# Patient Record
Sex: Male | Born: 1960 | Hispanic: No | Marital: Single | Smoking: Never smoker
Health system: Southern US, Community
[De-identification: ages and names within clinical notes are randomized; demographics above are authoritative.]

## PROBLEM LIST (undated history)

## (undated) DIAGNOSIS — J45909 Unspecified asthma, uncomplicated: Secondary | ICD-10-CM

## (undated) DIAGNOSIS — R7303 Prediabetes: Secondary | ICD-10-CM

## (undated) HISTORY — PX: TONSILLECTOMY: SUR1361

## (undated) HISTORY — DX: Prediabetes: R73.03

---

## 2001-06-04 ENCOUNTER — Emergency Department (HOSPITAL_COMMUNITY): Admission: EM | Admit: 2001-06-04 | Discharge: 2001-06-04 | Payer: Self-pay | Admitting: Emergency Medicine

## 2001-06-04 ENCOUNTER — Encounter: Payer: Self-pay | Admitting: Emergency Medicine

## 2001-09-22 ENCOUNTER — Inpatient Hospital Stay (HOSPITAL_COMMUNITY): Admission: EM | Admit: 2001-09-22 | Discharge: 2001-09-26 | Payer: Self-pay | Admitting: *Deleted

## 2001-09-30 ENCOUNTER — Other Ambulatory Visit (HOSPITAL_COMMUNITY): Admission: RE | Admit: 2001-09-30 | Discharge: 2001-11-08 | Payer: Self-pay | Admitting: Psychiatry

## 2011-10-02 DIAGNOSIS — J45901 Unspecified asthma with (acute) exacerbation: Secondary | ICD-10-CM | POA: Insufficient documentation

## 2011-11-17 DIAGNOSIS — J4550 Severe persistent asthma, uncomplicated: Secondary | ICD-10-CM | POA: Insufficient documentation

## 2011-11-17 DIAGNOSIS — J4551 Severe persistent asthma with (acute) exacerbation: Secondary | ICD-10-CM | POA: Insufficient documentation

## 2014-10-22 ENCOUNTER — Emergency Department (HOSPITAL_COMMUNITY)
Admission: EM | Admit: 2014-10-22 | Discharge: 2014-10-22 | Disposition: A | Discharge status: Home / Self Care | Payer: BLUE CROSS/BLUE SHIELD | Attending: Emergency Medicine | Admitting: Emergency Medicine

## 2014-10-22 ENCOUNTER — Emergency Department (HOSPITAL_COMMUNITY): Payer: BLUE CROSS/BLUE SHIELD

## 2014-10-22 ENCOUNTER — Encounter (HOSPITAL_COMMUNITY): Payer: Self-pay | Admitting: Emergency Medicine

## 2014-10-22 DIAGNOSIS — R0602 Shortness of breath: Secondary | ICD-10-CM

## 2014-10-22 DIAGNOSIS — J45909 Unspecified asthma, uncomplicated: Secondary | ICD-10-CM | POA: Diagnosis present

## 2014-10-22 DIAGNOSIS — J45901 Unspecified asthma with (acute) exacerbation: Secondary | ICD-10-CM | POA: Diagnosis not present

## 2014-10-22 HISTORY — DX: Unspecified asthma, uncomplicated: J45.909

## 2014-10-22 LAB — CBC WITH DIFFERENTIAL/PLATELET
Basophils Absolute: 0 10*3/uL (ref 0.0–0.1)
Basophils Relative: 0 % (ref 0–1)
Eosinophils Absolute: 0.5 10*3/uL (ref 0.0–0.7)
Eosinophils Relative: 5 % (ref 0–5)
HCT: 49.4 % (ref 39.0–52.0)
Hemoglobin: 17 g/dL (ref 13.0–17.0)
Lymphocytes Relative: 24 % (ref 12–46)
Lymphs Abs: 2.7 10*3/uL (ref 0.7–4.0)
MCH: 28.4 pg (ref 26.0–34.0)
MCHC: 34.4 g/dL (ref 30.0–36.0)
MCV: 82.5 fL (ref 78.0–100.0)
Monocytes Absolute: 0.6 10*3/uL (ref 0.1–1.0)
Monocytes Relative: 5 % (ref 3–12)
Neutro Abs: 7.6 10*3/uL (ref 1.7–7.7)
Neutrophils Relative %: 66 % (ref 43–77)
Platelets: 311 10*3/uL (ref 150–400)
RBC: 5.99 MIL/uL — ABNORMAL HIGH (ref 4.22–5.81)
RDW: 14 % (ref 11.5–15.5)
WBC: 11.4 10*3/uL — ABNORMAL HIGH (ref 4.0–10.5)

## 2014-10-22 LAB — BASIC METABOLIC PANEL
Anion gap: 8 (ref 5–15)
BUN: 12 mg/dL (ref 6–20)
CO2: 23 mmol/L (ref 22–32)
Calcium: 8.8 mg/dL — ABNORMAL LOW (ref 8.9–10.3)
Chloride: 108 mmol/L (ref 101–111)
Creatinine, Ser: 1.27 mg/dL — ABNORMAL HIGH (ref 0.61–1.24)
GFR calc Af Amer: >60 mL/min (ref >60)
GFR calc non Af Amer: >60 mL/min (ref >60)
Glucose, Bld: 118 mg/dL — ABNORMAL HIGH (ref 65–99)
Potassium: 4.5 mmol/L (ref 3.5–5.1)
Sodium: 139 mmol/L (ref 135–145)

## 2014-10-22 MED ORDER — IPRATROPIUM BROMIDE 0.02 % IN SOLN
0.5000 mg | Freq: Once | RESPIRATORY_TRACT | Status: AC
  Administered 2014-10-22: 0.5 mg via RESPIRATORY_TRACT

## 2014-10-22 MED ORDER — ALBUTEROL SULFATE (2.5 MG/3ML) 0.083% IN NEBU
INHALATION_SOLUTION | RESPIRATORY_TRACT | Status: AC
  Administered 2014-10-22: 5 mg via RESPIRATORY_TRACT
  Filled 2014-10-22: qty 6

## 2014-10-22 MED ORDER — ALBUTEROL (5 MG/ML) CONTINUOUS INHALATION SOLN
10.0000 mg/h | INHALATION_SOLUTION | Freq: Once | RESPIRATORY_TRACT | Status: AC
  Administered 2014-10-22: 10 mg/h via RESPIRATORY_TRACT
  Filled 2014-10-22: qty 20

## 2014-10-22 MED ORDER — ALBUTEROL SULFATE HFA 108 (90 BASE) MCG/ACT IN AERS: 1.0000 | INHALATION_SPRAY | RESPIRATORY_TRACT | Status: DC | PRN

## 2014-10-22 MED ORDER — PREDNISONE 20 MG PO TABS: ORAL_TABLET | ORAL | Status: DC

## 2014-10-22 MED ORDER — IPRATROPIUM BROMIDE 0.02 % IN SOLN
RESPIRATORY_TRACT | Status: AC
  Administered 2014-10-22: 0.5 mg via RESPIRATORY_TRACT
  Filled 2014-10-22: qty 2.5

## 2014-10-22 MED ORDER — ALBUTEROL SULFATE (2.5 MG/3ML) 0.083% IN NEBU
2.5000 mg | INHALATION_SOLUTION | Freq: Once | RESPIRATORY_TRACT | Status: AC
Start: 2014-10-22 — End: 2014-10-22
  Administered 2014-10-22: 5 mg via RESPIRATORY_TRACT

## 2014-10-22 MED ORDER — SODIUM CHLORIDE 0.9 % IV BOLUS (SEPSIS)
500.0000 mL | Freq: Once | INTRAVENOUS | Status: AC
  Administered 2014-10-22: 500 mL via INTRAVENOUS

## 2014-10-22 MED ORDER — METHYLPREDNISOLONE SODIUM SUCC 125 MG IJ SOLR
125.0000 mg | Freq: Once | INTRAMUSCULAR | Status: AC
  Administered 2014-10-22: 125 mg via INTRAVENOUS
  Filled 2014-10-22: qty 2

## 2014-10-22 NOTE — Discharge Instructions (Signed)

## 2014-10-22 NOTE — ED Notes (Signed)
Portable chest at bedside

## 2014-10-22 NOTE — Progress Notes (Signed)
Pt taken off CAT and on RA at this time. Pt states he feels much better. Clear BS bilaterally.

## 2014-10-22 NOTE — ED Notes (Signed)
Patient coming from home with acute asthma attack with lower right.  History of asthma.

## 2014-10-22 NOTE — ED Provider Notes (Signed)
CSN: [id number]     Arrival date & time 10/22/14  [id number] History   First MD Initiated Contact with Patient 10/22/14 [phone]     Chief Complaint  Patient presents with  . Asthma     (Consider location/radiation/quality/duration/timing/severity/associated sxs/prior Treatment) Patient is a 54 y.o. male presenting with asthma. The history is provided by the patient.  Asthma This is a chronic problem. The current episode started 1 to 2 hours ago. The problem occurs constantly. The problem has been rapidly worsening. Associated symptoms include shortness of breath. Pertinent negatives include no chest pain, no abdominal pain and no headaches. Nothing aggravates the symptoms. Nothing relieves the symptoms. He has tried nothing for the symptoms. The treatment provided no relief.    Past Medical History  Diagnosis Date  . Asthma    Past Surgical History  Procedure Laterality Date  . Tonsillectomy     History reviewed. No pertinent family history. History  Substance Use Topics  . Smoking status: Never Smoker   . Smokeless tobacco: Not on file  . Alcohol Use: No    Review of Systems  Constitutional: Negative for fever.  HENT: Negative for drooling and rhinorrhea.   Eyes: Negative for pain.  Respiratory: Positive for shortness of breath and wheezing.   Cardiovascular: Negative for chest pain and leg swelling.  Gastrointestinal: Negative for nausea, vomiting, abdominal pain and diarrhea.  Genitourinary: Negative for dysuria and hematuria.  Musculoskeletal: Negative for gait problem and neck pain.  Skin: Negative for color change.  Neurological: Negative for numbness and headaches.  Hematological: Negative for adenopathy.  Psychiatric/Behavioral: Negative for behavioral problems.  All other systems reviewed and are negative.     Allergies  Review of patient's allergies indicates no known allergies.  Home Medications   Prior to Admission medications   Not on File   There were no  vitals taken for this visit. Physical Exam  Constitutional: He appears well-developed and well-nourished. He appears distressed.  HENT:  Head: Normocephalic and atraumatic.  Right Ear: External ear normal.  Left Ear: External ear normal.  Nose: Nose normal.  Mouth/Throat: Oropharynx is clear and moist. No oropharyngeal exudate.  Eyes: Conjunctivae and EOM are normal. Pupils are equal, round, and reactive to light.  Neck: Normal range of motion. Neck supple.  Cardiovascular: Normal rate, regular rhythm, normal heart sounds and intact distal pulses.  Exam reveals no gallop and no friction rub.   No murmur heard. Pulmonary/Chest: He is in respiratory distress. He has wheezes.  Tachypneic on exam. Diminished breath sounds bilaterally with wheezing. Patient speaking in only a few short words.  Abdominal: Soft. Bowel sounds are normal. He exhibits no distension. There is no tenderness. There is no rebound and no guarding.  Musculoskeletal: Normal range of motion. He exhibits no edema or tenderness.  Neurological: He is alert.  Skin: Skin is warm and dry.  Psychiatric: He has a normal mood and affect. His behavior is normal.  Nursing note and vitals reviewed.   ED Course  Procedures (including critical care time) Labs Review Labs Reviewed  CBC WITH DIFFERENTIAL/PLATELET - Abnormal; Notable for the following:    WBC 11.4 (*)    RBC 5.99 (*)    All other components within normal limits  BASIC METABOLIC PANEL - Abnormal; Notable for the following:    Glucose, Bld 118 (*)    Creatinine, Ser 1.27 (*)    Calcium 8.8 (*)    All other components within normal limits    Imaging Review  Dg Chest Port 1 View  10/22/2014   CLINICAL DATA:  Asthma with shortness of breath  EXAM: PORTABLE CHEST - 1 VIEW  COMPARISON:  None.  FINDINGS: The lungs are clear. The heart size and pulmonary vascularity are normal. No adenopathy. No bone lesions.  IMPRESSION: No edema or consolidation.   Electronically  Signed   By: Bretta Bang III M.D.   On: 10/22/2014 08:55     EKG Interpretation   Date/Time:  Thursday October 22 2014 09:03:50 EDT Ventricular Rate:  90 PR Interval:  157 QRS Duration: 84 QT Interval:  381 QTC Calculation: 466 R Axis:   80 Text Interpretation:  Sinus rhythm Probable left atrial enlargement  Baseline wander in lead(s) V1 V3 Confirmed by Mariabelen Pressly  MD, Lorann Tani ([id number])  on 10/22/2014 9:35:57 AM      MDM   Final diagnoses:  SOB (shortness of breath)  Asthma exacerbation    8:41 AM 54 y.o. male with a history of asthma who presents with shortness of breath that began this morning. Patient initially tachypneic speaking only and short words. Diminished airflow bilaterally with wheezing. Patient states that his symptoms feel like previous asthma exacerbations. He denies any chest pain. Exam limited by his shortness of breath. Will treat with Solu-Medrol, continuous albuterol breathing treatment.  10:31 AM: I interpreted/reviewed the labs and/or imaging which were non-contributory.  Pt feeling much better on exam. I offered further observation and treatment versus discharge. The patient is feeling well and would like to go home. Lung sounds are clear. I have discussed the diagnosis/risks/treatment options with the patient and believe the pt to be eligible for discharge home to follow-up with his pcp as needed. We also discussed returning to the ED immediately if new or worsening sx occur. We discussed the sx which are most concerning (e.g., worsening sob, fever, cp) that necessitate immediate return. Medications administered to the patient during their visit and any new prescriptions provided to the patient are listed below.  Medications given during this visit Medications  albuterol (PROVENTIL) (2.5 MG/3ML) 0.083% nebulizer solution 2.5 mg (5 mg Nebulization Given 10/22/14 0841)  ipratropium (ATROVENT) nebulizer solution 0.5 mg (0.5 mg Nebulization Given 10/22/14 0841)   methylPREDNISolone sodium succinate (SOLU-MEDROL) 125 mg/2 mL injection 125 mg (125 mg Intravenous Given 10/22/14 0848)  albuterol (PROVENTIL,VENTOLIN) solution continuous neb (10 mg/hr Nebulization Given 10/22/14 0853)  sodium chloride 0.9 % bolus 500 mL (0 mLs Intravenous Stopped 10/22/14 0935)    New Prescriptions   ALBUTEROL (PROVENTIL HFA;VENTOLIN HFA) 108 (90 BASE) MCG/ACT INHALER    Inhale 1-2 puffs into the lungs every 4 (four) hours as needed for wheezing or shortness of breath.   PREDNISONE (DELTASONE) 20 MG TABLET    Take 3 tablets on day 1. Take 2 tablets on day 2 and 3. Take 1 tablet on day 4.     Purvis Sheffield, MD 10/22/14 1031

## 2015-05-22 ENCOUNTER — Emergency Department (HOSPITAL_COMMUNITY)
Admission: EM | Admit: 2015-05-22 | Discharge: 2015-05-22 | Disposition: A | Discharge status: Home / Self Care | Payer: BLUE CROSS/BLUE SHIELD | Attending: Emergency Medicine | Admitting: Emergency Medicine

## 2015-05-22 ENCOUNTER — Encounter (HOSPITAL_COMMUNITY): Payer: Self-pay | Admitting: *Deleted

## 2015-05-22 DIAGNOSIS — J45901 Unspecified asthma with (acute) exacerbation: Secondary | ICD-10-CM | POA: Diagnosis not present

## 2015-05-22 DIAGNOSIS — Z79899 Other long term (current) drug therapy: Secondary | ICD-10-CM | POA: Diagnosis not present

## 2015-05-22 DIAGNOSIS — J45909 Unspecified asthma, uncomplicated: Secondary | ICD-10-CM | POA: Diagnosis present

## 2015-05-22 MED ORDER — ALBUTEROL SULFATE (2.5 MG/3ML) 0.083% IN NEBU
5.0000 mg | INHALATION_SOLUTION | Freq: Once | RESPIRATORY_TRACT | Status: AC
  Administered 2015-05-22: 5 mg via RESPIRATORY_TRACT

## 2015-05-22 MED ORDER — PREDNISONE 20 MG PO TABS
60.0000 mg | ORAL_TABLET | Freq: Once | ORAL | Status: AC
  Administered 2015-05-22: 60 mg via ORAL
  Filled 2015-05-22: qty 3

## 2015-05-22 MED ORDER — PREDNISONE 10 MG PO TABS: ORAL_TABLET | ORAL | Status: DC

## 2015-05-22 MED ORDER — ALBUTEROL SULFATE (2.5 MG/3ML) 0.083% IN NEBU
INHALATION_SOLUTION | RESPIRATORY_TRACT | Status: AC
  Filled 2015-05-22: qty 6

## 2015-05-22 MED ORDER — ALBUTEROL SULFATE HFA 108 (90 BASE) MCG/ACT IN AERS: 2.0000 | INHALATION_SPRAY | RESPIRATORY_TRACT | Status: DC | PRN

## 2015-05-22 NOTE — ED Notes (Signed)
Pt reports onset this am of asthma attack and no relief with inhalers. spo2 96% at triage.

## 2015-05-22 NOTE — ED Provider Notes (Signed)
CSN: [id number]     Arrival date & time 05/22/15  1519 History   First MD Initiated Contact with Patient 05/22/15 1621     Chief Complaint  Patient presents with  . Asthma     (Consider location/radiation/quality/duration/timing/severity/associated sxs/prior Treatment) HPI Jason Cardenas is a 55 y.o. male with hx of asthma, presents to ED with complaint of shortness of breath. Patient states that he was up this morning, stating that his mother's house, when he suddenly developed wheezing, chest tightness, shortness of breath. Patient states it felt like his asthma attack. He used inhaler 3 times, which did not help. He states he tried to wait it out to see if he feels better, but he was not improving so decided to come to emergency department. Patient denies any chest pain. He denies any cough. He received a breathing treatment upon arrival to emergency department and states he feels much better and back to baseline. He states he has had multiple asthma attacks in the past and this is what it felt like. At this time he is asymptomatic and has no complaints.  Past Medical History  Diagnosis Date  . Asthma    Past Surgical History  Procedure Laterality Date  . Tonsillectomy     History reviewed. No pertinent family history. Social History  Substance Use Topics  . Smoking status: Never Smoker   . Smokeless tobacco: None  . Alcohol Use: No    Review of Systems  Constitutional: Negative for fever and chills.  Respiratory: Positive for shortness of breath and wheezing. Negative for cough and chest tightness.   Cardiovascular: Negative for chest pain, palpitations and leg swelling.  Gastrointestinal: Negative for nausea, vomiting, abdominal pain, diarrhea and abdominal distention.  Genitourinary: Negative for urgency.  Musculoskeletal: Negative for myalgias, arthralgias, neck pain and neck stiffness.  Skin: Negative for rash.  Allergic/Immunologic: Negative for immunocompromised  state.  Neurological: Negative for dizziness, weakness, light-headedness, numbness and headaches.  All other systems reviewed and are negative.     Allergies  Review of patient's allergies indicates no known allergies.  Home Medications   Prior to Admission medications   Medication Sig Start Date End Date Taking? Authorizing Provider  albuterol (PROVENTIL HFA;VENTOLIN HFA) 108 (90 BASE) MCG/ACT inhaler Inhale 1-2 puffs into the lungs every 4 (four) hours as needed for wheezing or shortness of breath. 10/22/14   Purvis Sheffield, MD  predniSONE (DELTASONE) 20 MG tablet Take 3 tablets on day 1. Take 2 tablets on day 2 and 3. Take 1 tablet on day 4. 10/23/14   Purvis Sheffield, MD   BP 143/100 mmHg  Pulse 107  Temp(Src) 97.5 F (36.4 C) (Oral)  Resp 22  SpO2 96% Physical Exam  Constitutional: He is oriented to person, place, and time. He appears well-developed and well-nourished. No distress.  HENT:  Head: Normocephalic and atraumatic.  Eyes: Conjunctivae are normal.  Neck: Neck supple.  Cardiovascular: Normal rate, regular rhythm and normal heart sounds.   Pulmonary/Chest: Effort normal. No respiratory distress. He has no wheezes. He has no rales. He exhibits no tenderness.  Abdominal: Soft. Bowel sounds are normal. He exhibits no distension. There is no tenderness. There is no rebound.  Musculoskeletal: He exhibits no edema.  Neurological: He is alert and oriented to person, place, and time.  Skin: Skin is warm and dry.  Nursing note and vitals reviewed.   ED Course  Procedures (including critical care time) Labs Review Labs Reviewed - No data to display  Imaging  Review No results found. I have personally reviewed and evaluated these images and lab results as part of my medical decision-making.   EKG Interpretation None      MDM   Final diagnoses:  Asthma, unspecified asthma severity, uncomplicated    Patient with sudden onset of wheezing and shortness of breath  earlier today. Now resolved after breathing treatment. Patient states that his inhaler is all out. On my evaluation patient is no longer wheezing. He denies any chest pain or cough. He is in no distress. He states he feels back to normal. Patient has oxygen saturation 100% on room air. He is tachycardic, but I suspect this could be from albuterol. I do not suspect patient has a PE, given his symptoms completely resolve, he denied any chest pain, he is hypoxic, he has no recent travel or surgeries, no lower extremity swelling or pain. We'll discharge him home on a short prednisone course, inhaler, follow-up as needed. Return precautions discussed   Filed Vitals:   05/22/15 1530 05/22/15 1647  BP: 143/100 118/90  Pulse: 107 109  Temp: 97.5 F (36.4 C) 98 F (36.7 C)  TempSrc: Oral   Resp: 22 18  SpO2: 96% 100%       Jaynie Crumble, PA-C 05/22/15 1936  Laurence Spates, MD 05/22/15 2307

## 2015-05-22 NOTE — Discharge Instructions (Signed)
Use your inhaler 2 puffs every 4 hours as needed. Prednisone as prescribed until all gone, next dose tomorrow. Follow-up with your primary care doctor as needed.   Asthma, Acute Bronchospasm Acute bronchospasm caused by asthma is also referred to as an asthma attack. Bronchospasm means your air passages become narrowed. The narrowing is caused by inflammation and tightening of the muscles in the air tubes (bronchi) in your lungs. This can make it hard to breathe or cause you to wheeze and cough. CAUSES Possible triggers are:  Animal dander from the skin, hair, or feathers of animals.  Dust mites contained in house dust.  Cockroaches.  Pollen from trees or grass.  Mold.  Cigarette or tobacco smoke.  Air pollutants such as dust, household cleaners, hair sprays, aerosol sprays, paint fumes, strong chemicals, or strong odors.  Cold air or weather changes. Cold air may trigger inflammation. Winds increase molds and pollens in the air.  Strong emotions such as crying or laughing hard.  Stress.  Certain medicines such as aspirin or beta-blockers.  Sulfites in foods and drinks, such as dried fruits and wine.  Infections or inflammatory conditions, such as a flu, cold, or inflammation of the nasal membranes (rhinitis).  Gastroesophageal reflux disease (GERD). GERD is a condition where stomach acid backs up into your esophagus.  Exercise or strenuous activity. SIGNS AND SYMPTOMS   Wheezing.  Excessive coughing, particularly at night.  Chest tightness.  Shortness of breath. DIAGNOSIS  Your health care provider will ask you about your medical history and perform a physical exam. A chest X-ray or blood testing may be performed to look for other causes of your symptoms or other conditions that may have triggered your asthma attack. TREATMENT  Treatment is aimed at reducing inflammation and opening up the airways in your lungs. Most asthma attacks are treated with inhaled  medicines. These include quick relief or rescue medicines (such as bronchodilators) and controller medicines (such as inhaled corticosteroids). These medicines are sometimes given through an inhaler or a nebulizer. Systemic steroid medicine taken by mouth or given through an IV tube also can be used to reduce the inflammation when an attack is moderate or severe. Antibiotic medicines are only used if a bacterial infection is present.  HOME CARE INSTRUCTIONS   Rest.  Drink plenty of liquids. This helps the mucus to remain thin and be easily coughed up. Only use caffeine in moderation and do not use alcohol until you have recovered from your illness.  Do not smoke. Avoid being exposed to secondhand smoke.  You play a critical role in keeping yourself in good health. Avoid exposure to things that cause you to wheeze or to have breathing problems.  Keep your medicines up-to-date and available. Carefully follow your health care provider's treatment plan.  Take your medicine exactly as prescribed.  When pollen or pollution is bad, keep windows closed and use an air conditioner or go to places with air conditioning.  Asthma requires careful medical care. See your health care provider for a follow-up as advised. If you are more than [redacted] weeks pregnant and you were prescribed any new medicines, let your obstetrician know about the visit and how you are doing. Follow up with your health care provider as directed.  After you have recovered from your asthma attack, make an appointment with your outpatient doctor to talk about ways to reduce the likelihood of future attacks. If you do not have a doctor who manages your asthma, make an appointment with  a primary care doctor to discuss your asthma. SEEK IMMEDIATE MEDICAL CARE IF:   You are getting worse.  You have trouble breathing. If severe, call your local emergency services (911 in the U.S.).  You develop chest pain or discomfort.  You are  vomiting.  You are not able to keep fluids down.  You are coughing up yellow, green, brown, or bloody sputum.  You have a fever and your symptoms suddenly get worse.  You have trouble swallowing. MAKE SURE YOU:   Understand these instructions.  Will watch your condition.  Will get help right away if you are not doing well or get worse.   This information is not intended to replace advice given to you by your health care provider. Make sure you discuss any questions you have with your health care provider.   Document Released: 07/26/2006 Document Revised: 04/15/2013 Document Reviewed: 10/16/2012 Elsevier Interactive Patient Education 2016 [employer].    Emergency Department Resource Guide 1) Find a Doctor and Pay Out of Pocket Although you won't have to find out who is covered by your insurance plan, it is a good idea to ask around and get recommendations. You will then need to call the office and see if the doctor you have chosen will accept you as a new patient and what types of options they offer for patients who are self-pay. Some doctors offer discounts or will set up payment plans for their patients who do not have insurance, but you will need to ask so you aren't surprised when you get to your appointment.  2) Contact Your Local Health Department Not all health departments have doctors that can see patients for sick visits, but many do, so it is worth a call to see if yours does. If you don't know where your local health department is, you can check in your phone book. The CDC also has a tool to help you locate your state's health department, and many state websites also have listings of all of their local health departments.  3) Find a Walk-in Clinic If your illness is not likely to be very severe or complicated, you may want to try a walk in clinic. These are popping up all over the country in pharmacies, drugstores, and shopping centers. They're usually staffed by nurse  practitioners or physician assistants that have been trained to treat common illnesses and complaints. They're usually fairly quick and inexpensive. However, if you have serious medical issues or chronic medical problems, these are probably not your best option.  No Primary Care Doctor: - Call Health Connect at  [phone] - they can help you locate a primary care doctor that  accepts your insurance, provides certain services, etc. - Physician Referral Service- [phone]  Chronic Pain Problems: Organization         Address  Phone   Notes  [facility]  [phone] Patients need to be referred by their primary care doctor.   Medication Assistance: Organization         Address  Phone   Notes  [facility] Medication [facility] [address] [place] [place], [place] [address] [phone] --Must be a resident of [facility] -- Must have NO insurance coverage whatsoever (no Medicaid/ Medicare, etc.) -- The pt. MUST have a primary care doctor that directs their care regularly and follows them in the community   [facility]ist  [phone]   [employer]  [phone]    Agencies that  provide inexpensive medical care: Organization         Address  Phone   Notes  [facility]  [phone]   [facility]    [phone]   [facility] [address] [place], [place] [address] [phone]   [facility] [address] [place], [place] [phone]   Planned Parenthood    [phone]   [facility]    [phone]   Community [facility] and [facility]  [address], [place] Phone:  [phone], Fax:  [phone] Hours of Operation:  9 am - 6 pm, M-F.  Also accepts Medicaid/Medicare and self-pay.  [facility]  [address], [place] Phone: [phone], Fax: [phone]. Hours of Operation:  8:30 am -  5:30 pm, M-F.  Also accepts Medicaid and self-pay.  [facility] [place] [address], [place] Phone: [phone]   [facility] [address] Natasha Bence [place], [place] [phone], Ext. [phone] Mondays & Thursdays: 7-9 AM.  First 15 patients are seen on a first come, first serve basis.    Medicaid-accepting [facility] Providers:  Organization         Address  Phone   Notes  [facility] [address], [place] [phone] Also accepts self-pay patients.  [facility] [address] Laurell Josephs [place], [place]  [phone]   [facility] [address], [place] [phone]   [facility] [address], [place] [phone]   Renaye Rakers [address], [place]   [phone] Only accepts [place] Access [place] patients after they have their name applied to their card.   Self-Pay (no insurance) in [facility]:  Organization         Address  Phone   Notes  Sickle Cell Patients, [facility] [address] [place], [place] [phone]   [facility] [address] [place], [place] [phone]   [facility] [address]  [address], [place] [phone]   [facility]/Dr. Osei-Bonsu  [address], [place] or [address], [place] [phone] Phone number for both [place] and [place] locations is the same.  [facility]gent [facility] and [facility] [address], [place] [phone]   [facility] [address] [place] or [address] [phone]   [facility] [address] [place] [phone], phone; [phone], fax Sees patients 1st and 3rd Saturday of every month.  Must not qualify for public or private insurance (i.e. Medicaid, Medicare, [place] Health Choice,  Veterans' Benefits)  Household income should be no more than 200% of the poverty level The clinic cannot treat you if you are pregnant or think you are pregnant  Sexually transmitted diseases are not treated at the clinic.    Dental Care: Organization         Address  Phone  Notes  [facility] Department of [facility] [address] [place], [place] [phone] Accepts children up to age 75 who are enrolled in [place] or [place] Health Choice; pregnant women with a Medicaid card; and children who have applied for Medicaid or [place] Health  Choice, but were declined, whose parents can pay a reduced fee at time of service.  [facility]  [address] [place] [address], [place] [phone] Accepts children up to age 7 who are enrolled in [place] or [place] Health Choice; pregnant women with a Medicaid card; and children who have applied for Medicaid or [place] Health Choice, but were declined, whose parents can pay a reduced fee at time of service.  [facility]  [address] [place], [place] [phone] Patients are seen by appointment only. Walk-ins are not accepted. [facility] will see patients 43 years of age and older. Monday - Tuesday (8am-5pm) Most Wednesdays (8:30-5pm) $30 per visit, cash only  [facility] Access [facility]  [address], [facility] [phone] Patients are seen by appointment only. Walk-ins are not accepted. [facility] will see patients 48 years of age and older. One Wednesday Evening (Monthly: Volunteer Based).  $30 per visit, cash only  [employer]  [phone] for adults; Children under age 32, call Graduate Pediatric Dentistry at [phone]. Children aged 9-14, please call [phone] to request a pediatric application.  Dental services are provided in all areas of dental care including fillings, crowns and bridges, complete and  partial dentures, implants, gum treatment, root canals, and extractions. Preventive care is also provided. Treatment is provided to both adults and children. Patients are selected via a lottery and there is often a waiting list.   [facility] [address] [place]  [phone] [email or web address]   [facility] [address] [place], [place] [phone], Ext. [phone] Second and Fourth Thursday of each month, opens at 6:30 AM; Clinic ends at 9 AM.  Patients are seen on a first-come first-served basis, and a limited number are seen during each clinic.   [facility]  [address] Ether Griffins [place], [place] [phone]   Eligibility Requirements You must have lived in [place], [place], or [place] counties for at least the last three months.   You cannot be eligible for state or federal sponsored [place], including [employer], [place], or [employer].   You generally cannot be eligible for healthcare insurance through your employer.    How to apply: Eligibility screenings are held every Tuesday and Wednesday afternoon from 1:00 pm until 4:00 pm. You do not need an appointment for the interview!  [facility] [address], [place], [place] [phone]   [facility] Health Department  [phone]   [facility]  [phone]   [facility]  [phone]    Behavioral Health Resources in the Community: Intensive Outpatient Programs Organization         Address  Phone  Notes  [facility] [address] [place], [place] [phone]   [facility] [address], [place], [place] [phone]   [facility] [address], [place], [place]  [phone]   [facility] [address]  [place], [place] [phone] or [phone]   Substance Abuse Resources Organization          Address  Phone  Notes  [facility] Drug [facility]  [phone]   [facility]  [phone]   [facility]  [phone]   Floydene Flock  [phone]   Residential & Outpatient Substance Abuse Program  [phone]  Psychological Services Organization         Address  Phone  Notes  [facility] [place]  [place]  [phone]   [place] [address], [place] or [phone]    Mobile Crisis Teams Organization         Address  Phone  Notes  [facility], Mobile Crisis Care Unit  [phone]   [facility]  [address] [place], [place]   [place] Levels [address] [place] [phone]    Self-Help/Support Groups Organization         Address  Phone             Notes  [place] of [place] - variety of support groups  [place] Call for more information  Narcotics Anonymous (NA), [facility] [address], [employer]   2 meetings at this location   Special educational needs teacher         Address  Phone  Notes  [facility] [place],    [place]  [phone]   [facility]  [address], [place] [phone], [place], [place]   [place], [place] [phone] Admissions: 8am-3pm M-F  Incentives Substance [place] [address]    [place], [place] [phone]   [facility] [address] [place], [place], [place]   [facility] [address]  [place], [place]   [facility] - Intensive Outpatient [address] Kristeen Mans [address], [place], [place]   [facility] ([place].,  [place], [place] [phone] or [phone]   [facility] ([facility]) [address], [place], [place] Accepts Medicaid  [facility] [address],  [place] [phone] Substance Abuse/Addiction Treatment   [facility] Organization         Address  Phone  Notes  [facility]  [phone]   Domenic Schwab, PhD [address] Arlis Porta [place], [place]   [phone] or [phone]   Rufus Conyers [place], [place] [phone]   [facility] [address] [place], [place] [phone] Insurance/Medicaid/sponsorship through [facility] and Families [address] [place]                                    [place], [place] [phone] [place] [address], [place] [phone]    Dr. Adele Schilder  [phone]   [facility] Dept. 1) [address], [place] 2) [place] 3)  [place] [address], [place] [phone]  [phone]   [place] [phone] or [phone] (After Hours)

## 2015-11-10 ENCOUNTER — Emergency Department (HOSPITAL_COMMUNITY)
Admission: EM | Admit: 2015-11-10 | Discharge: 2015-11-10 | Disposition: A | Discharge status: Home / Self Care | Payer: Self-pay | Attending: Emergency Medicine | Admitting: Emergency Medicine

## 2015-11-10 DIAGNOSIS — J45901 Unspecified asthma with (acute) exacerbation: Secondary | ICD-10-CM

## 2015-11-10 MED ORDER — ALBUTEROL SULFATE HFA 108 (90 BASE) MCG/ACT IN AERS
2.0000 | INHALATION_SPRAY | Freq: Once | RESPIRATORY_TRACT | Status: AC
  Administered 2015-11-10: 2 via RESPIRATORY_TRACT
  Filled 2015-11-10: qty 6.7

## 2015-11-10 MED ORDER — DEXAMETHASONE SODIUM PHOSPHATE 10 MG/ML IJ SOLN
10.0000 mg | Freq: Once | INTRAMUSCULAR | Status: AC
  Administered 2015-11-10: 10 mg via INTRAMUSCULAR
  Filled 2015-11-10: qty 1

## 2015-11-10 MED ORDER — ALBUTEROL SULFATE (2.5 MG/3ML) 0.083% IN NEBU
INHALATION_SOLUTION | RESPIRATORY_TRACT | Status: AC
  Filled 2015-11-10: qty 6

## 2015-11-10 NOTE — ED Notes (Signed)
Patient states started having problems this morning about 6 am.   Patient has used inhaler x 3 today and then ran out.   Patient states the inhaler helps.   Patient using accessory muscles to breath at triage.   Patient is diaphoretic also.   Shortness of breath continues to worsen.

## 2015-11-10 NOTE — Discharge Instructions (Signed)
Please use your inhaler as prescribed. Follow-up with your doctor or the community health and wellness Center to establish primary care. Return to ED for any new or worsening symptoms as we discussed.  Asthma, Acute Bronchospasm Acute bronchospasm caused by asthma is also referred to as an asthma attack. Bronchospasm means your air passages become narrowed. The narrowing is caused by inflammation and tightening of the muscles in the air tubes (bronchi) in your lungs. This can make it hard to breathe or cause you to wheeze and cough. CAUSES Possible triggers are:  Animal dander from the skin, hair, or feathers of animals.  Dust mites contained in house dust.  Cockroaches.  Pollen from trees or grass.  Mold.  Cigarette or tobacco smoke.  Air pollutants such as dust, household cleaners, hair sprays, aerosol sprays, paint fumes, strong chemicals, or strong odors.  Cold air or weather changes. Cold air may trigger inflammation. Winds increase molds and pollens in the air.  Strong emotions such as crying or laughing hard.  Stress.  Certain medicines such as aspirin or beta-blockers.  Sulfites in foods and drinks, such as dried fruits and wine.  Infections or inflammatory conditions, such as a flu, cold, or inflammation of the nasal membranes (rhinitis).  Gastroesophageal reflux disease (GERD). GERD is a condition where stomach acid backs up into your esophagus.  Exercise or strenuous activity. SIGNS AND SYMPTOMS   Wheezing.  Excessive coughing, particularly at night.  Chest tightness.  Shortness of breath. DIAGNOSIS  Your health care provider will ask you about your medical history and perform a physical exam. A chest X-ray or blood testing may be performed to look for other causes of your symptoms or other conditions that may have triggered your asthma attack. TREATMENT  Treatment is aimed at reducing inflammation and opening up the airways in your lungs. Most asthma  attacks are treated with inhaled medicines. These include quick relief or rescue medicines (such as bronchodilators) and controller medicines (such as inhaled corticosteroids). These medicines are sometimes given through an inhaler or a nebulizer. Systemic steroid medicine taken by mouth or given through an IV tube also can be used to reduce the inflammation when an attack is moderate or severe. Antibiotic medicines are only used if a bacterial infection is present.  HOME CARE INSTRUCTIONS   Rest.  Drink plenty of liquids. This helps the mucus to remain thin and be easily coughed up. Only use caffeine in moderation and do not use alcohol until you have recovered from your illness.  Do not smoke. Avoid being exposed to secondhand smoke.  You play a critical role in keeping yourself in good health. Avoid exposure to things that cause you to wheeze or to have breathing problems.  Keep your medicines up-to-date and available. Carefully follow your health care provider's treatment plan.  Take your medicine exactly as prescribed.  When pollen or pollution is bad, keep windows closed and use an air conditioner or go to places with air conditioning.  Asthma requires careful medical care. See your health care provider for a follow-up as advised. If you are more than [redacted] weeks pregnant and you were prescribed any new medicines, let your obstetrician know about the visit and how you are doing. Follow up with your health care provider as directed.  After you have recovered from your asthma attack, make an appointment with your outpatient doctor to talk about ways to reduce the likelihood of future attacks. If you do not have a doctor who manages your  asthma, make an appointment with a primary care doctor to discuss your asthma. SEEK IMMEDIATE MEDICAL CARE IF:   You are getting worse.  You have trouble breathing. If severe, call your local emergency services (911 in the [place].S.).  You develop chest pain or  discomfort.  You are vomiting.  You are not able to keep fluids down.  You are coughing up yellow, green, brown, or bloody sputum.  You have a fever and your symptoms suddenly get worse.  You have trouble swallowing. MAKE SURE YOU:   Understand these instructions.  Will watch your condition.  Will get help right away if you are not doing well or get worse.   This information is not intended to replace advice given to you by your health care provider. Make sure you discuss any questions you have with your health care provider.   Document Released: 07/26/2006 Document Revised: 04/15/2013 Document Reviewed: 10/16/2012 Elsevier Interactive Patient Education [employer] Elsevier Inc.

## 2015-11-10 NOTE — ED Notes (Signed)
Patient doing much better during nebulizer.

## 2015-11-10 NOTE — ED Provider Notes (Signed)
CSN: [id number]     Arrival date & time 11/10/15  1326 History   First MD Initiated Contact with Patient 11/10/15 1530     Chief Complaint  Patient presents with  . Asthma     (Consider location/radiation/quality/duration/timing/severity/associated sxs/prior Treatment) HPI Jason Cardenas is a 55 y.o. male with history of asthma here for evaluation of asthma exacerbation. Patient reports presently 3 hours ago, he began to experience an exacerbation of his asthma. He used his albuterol inhaler, which only had 2 puffs left. He reports initially this is helpful, but seemed to worsen as he ran out of his inhaler. Denies any fevers, chills, productive cough, leg swelling, chest pain. Patient received a breathing treatment in triage and states feels much better. No other modifying factors.  Past Medical History  Diagnosis Date  . Asthma    Past Surgical History  Procedure Laterality Date  . Tonsillectomy     No family history on file. Social History  Substance Use Topics  . Smoking status: Never Smoker   . Smokeless tobacco: Not on file  . Alcohol Use: No    Review of Systems A 10 point review of systems was completed and was negative except for pertinent positives and negatives as mentioned in the history of present illness    Allergies  Review of patient's allergies indicates no known allergies.  Home Medications   Prior to Admission medications   Medication Sig Start Date End Date Taking? Authorizing Provider  albuterol (PROVENTIL HFA;VENTOLIN HFA) 108 (90 Base) MCG/ACT inhaler Inhale 2 puffs into the lungs every 4 (four) hours as needed for wheezing or shortness of breath. 05/22/15   Tatyana Kirichenko, PA-C  predniSONE (DELTASONE) 10 MG tablet Take 5 tab day 1, take 4 tab day 2, take 3 tab day 3, take 2 tab day 4, and take 1 tab day 5 05/22/15   Tatyana Kirichenko, PA-C   BP 127/89 mmHg  Pulse 78  Temp(Src) 97.8 F (36.6 C) (Oral)  Resp 12  SpO2 99% Physical Exam   Constitutional: He is oriented to person, place, and time. He appears well-developed and well-nourished.  HENT:  Head: Normocephalic and atraumatic.  Mouth/Throat: Oropharynx is clear and moist.  Eyes: Conjunctivae are normal. Pupils are equal, round, and reactive to light. Right eye exhibits no discharge. Left eye exhibits no discharge. No scleral icterus.  Neck: Neck supple.  Cardiovascular: Normal rate, regular rhythm and normal heart sounds.   Pulmonary/Chest: Effort normal and breath sounds normal. No respiratory distress. He has no wheezes. He has no rales.  Abdominal: Soft. There is no tenderness.  Musculoskeletal: He exhibits no tenderness.  Neurological: He is alert and oriented to person, place, and time.  Cranial Nerves II-XII grossly intact  Skin: Skin is warm and dry. No rash noted.  Psychiatric: He has a normal mood and affect.  Nursing note and vitals reviewed.   ED Course  Procedures (including critical care time) Labs Review Labs Reviewed - No data to display  Imaging Review No results found. I have personally reviewed and evaluated these images and lab results as part of my medical decision-making.   EKG Interpretation None      MDM  Patient with history of asthma here for evaluation of acute asthma exacerbation. Ran out of albuterol inhaler at home which incited his exacerbation. Breathing treatment in triage resolved symptoms. He appears very well, hemodynamically stable, afebrile. Oxygen saturation 99% on room air. Benign cardiopulmonary exam. Given IM Decadron, albuterol inhaler at  home. I do not feel that he requires home steroids. Doubt PE, ACS or other emergent cardiopulmonary pathology. Given referral to community health and wellness to establish primary care. Return precautions discussed. Appropriate for discharge  Final diagnoses:  Asthma exacerbation        Jason PeekBenjamin Zaydyn Havey, PA-C 11/10/15 1632  Maia PlanJoshua G Long, MD 11/10/15 1924

## 2015-12-05 ENCOUNTER — Emergency Department (HOSPITAL_COMMUNITY): Payer: Self-pay

## 2015-12-05 ENCOUNTER — Emergency Department (HOSPITAL_COMMUNITY)
Admission: EM | Admit: 2015-12-05 | Discharge: 2015-12-05 | Disposition: A | Discharge status: Home / Self Care | Payer: Self-pay | Attending: Emergency Medicine | Admitting: Emergency Medicine

## 2015-12-05 ENCOUNTER — Encounter (HOSPITAL_COMMUNITY): Payer: Self-pay

## 2015-12-05 DIAGNOSIS — Z79899 Other long term (current) drug therapy: Secondary | ICD-10-CM | POA: Insufficient documentation

## 2015-12-05 DIAGNOSIS — J45901 Unspecified asthma with (acute) exacerbation: Secondary | ICD-10-CM

## 2015-12-05 MED ORDER — ALBUTEROL SULFATE HFA 108 (90 BASE) MCG/ACT IN AERS
4.0000 | INHALATION_SPRAY | Freq: Once | RESPIRATORY_TRACT | Status: AC
  Administered 2015-12-05: 4 via RESPIRATORY_TRACT
  Filled 2015-12-05: qty 6.7

## 2015-12-05 MED ORDER — ALBUTEROL SULFATE (2.5 MG/3ML) 0.083% IN NEBU
5.0000 mg | INHALATION_SOLUTION | Freq: Once | RESPIRATORY_TRACT | Status: AC
  Administered 2015-12-05: 5 mg via RESPIRATORY_TRACT
  Filled 2015-12-05: qty 6

## 2015-12-05 MED ORDER — PREDNISONE 20 MG PO TABS: 60.0000 mg | ORAL_TABLET | Freq: Every day | ORAL | 0 refills | Status: AC

## 2015-12-05 MED ORDER — ALBUTEROL SULFATE HFA 108 (90 BASE) MCG/ACT IN AERS: 1.0000 | INHALATION_SPRAY | Freq: Four times a day (QID) | RESPIRATORY_TRACT | 0 refills | Status: DC | PRN

## 2015-12-05 MED ORDER — PREDNISONE 20 MG PO TABS
60.0000 mg | ORAL_TABLET | Freq: Once | ORAL | Status: AC
  Administered 2015-12-05: 60 mg via ORAL
  Filled 2015-12-05: qty 3

## 2015-12-05 NOTE — ED Provider Notes (Signed)
[facility]-EMERGENCY DEPT Provider Note   CSN: [id number] Arrival date & time: 12/05/15  2013  First Provider Contact:  None       History   Chief Complaint Chief Complaint  Patient presents with  . Shortness of Breath    HPI Jason Cardenas is a 55 y.o. male.  The history is provided by the patient and medical records.  Shortness of Breath  This is a recurrent problem. The average episode lasts 1 day. The problem occurs frequently.The current episode started yesterday. The problem has been gradually worsening. Associated symptoms include wheezing. Pertinent negatives include no fever, no headaches, no rhinorrhea, no neck pain, no cough, no sputum production, no chest pain, no vomiting, no abdominal pain, no rash and no leg swelling. The problem's precipitants include weather/humidity and smoke. He has tried beta-agonist inhalers for the symptoms. The treatment provided mild relief. He has had no prior hospitalizations. He has had prior ED visits. He has had no prior ICU admissions. Associated medical issues include asthma.    Past Medical History:  Diagnosis Date  . Asthma     There are no active problems to display for this patient.   Past Surgical History:  Procedure Laterality Date  . TONSILLECTOMY         Home Medications    Prior to Admission medications   Medication Sig Start Date End Date Taking? Authorizing Provider  albuterol (PROVENTIL HFA;VENTOLIN HFA) 108 (90 Base) MCG/ACT inhaler Inhale 2 puffs into the lungs every 4 (four) hours as needed for wheezing or shortness of breath. 05/22/15  Yes Tatyana Kirichenko, PA-C  Multiple Vitamins-Minerals (MULTIVITAMIN WITH MINERALS) tablet Take 1 tablet by mouth daily.   Yes Historical Provider, MD  albuterol (PROVENTIL HFA;VENTOLIN HFA) 108 (90 Base) MCG/ACT inhaler Inhale 1-2 puffs into the lungs every 6 (six) hours as needed for wheezing or shortness of breath. 12/05/15   Shaune Pollackameron Gusta Marksberry, MD  predniSONE (DELTASONE) 20 MG  tablet Take 3 tablets (60 mg total) by mouth daily. 12/05/15 12/10/15  Shaune Pollackameron Ashlei Chinchilla, MD    Family History History reviewed. No pertinent family history.  Social History Social History  Substance Use Topics  . Smoking status: Never Smoker  . Smokeless tobacco: Not on file  . Alcohol use No     Allergies   Review of patient's allergies indicates no known allergies.   Review of Systems Review of Systems  Constitutional: Negative for chills, fatigue and fever.  HENT: Negative for congestion and rhinorrhea.   Eyes: Negative for visual disturbance.  Respiratory: Positive for shortness of breath and wheezing. Negative for cough and sputum production.   Cardiovascular: Negative for chest pain and leg swelling.  Gastrointestinal: Negative for abdominal pain, diarrhea, nausea and vomiting.  Genitourinary: Negative for dysuria and flank pain.  Musculoskeletal: Negative for neck pain and neck stiffness.  Skin: Negative for rash and wound.  Allergic/Immunologic: Negative for immunocompromised state.  Neurological: Negative for syncope, weakness and headaches.     Physical Exam Updated Vital Signs BP 135/88   Pulse 97   Resp 16   SpO2 94%   Physical Exam  Constitutional: He is oriented to person, place, and time. He appears well-developed and well-nourished. No distress.  HENT:  Head: Normocephalic and atraumatic.  Mouth/Throat: Oropharynx is clear and moist.  Eyes: Conjunctivae are normal. Pupils are equal, round, and reactive to light.  Neck: Neck supple.  Cardiovascular: Normal rate, regular rhythm and normal heart sounds.  Exam reveals no friction rub.   No  murmur heard. Pulmonary/Chest: Effort normal. No respiratory distress. He has no decreased breath sounds. He has wheezes (mild, diffuse, end-expiratory only). He has no rhonchi. He has no rales.  Abdominal: He exhibits no distension. There is no tenderness.  Musculoskeletal: He exhibits no edema.  Neurological: He is  alert and oriented to person, place, and time. He exhibits normal muscle tone.  Skin: Skin is warm. Capillary refill takes less than 2 seconds.  Nursing note and vitals reviewed.    ED Treatments / Results  Labs (all labs ordered are listed, but only abnormal results are displayed) Labs Reviewed - No data to display  EKG  EKG Interpretation None       Radiology Dg Chest 2 View  Result Date: 12/05/2015 CLINICAL DATA:  Shortness of breath for 2 hours. EXAM: CHEST  2 VIEW COMPARISON:  10/22/2014 FINDINGS: Airway thickening is present, suggesting bronchitis or reactive airways disease. Cardiac and mediastinal margins appear normal. No pneumothorax or pneumomediastinum identified. No airspace opacity or pleural effusion. Mild thoracic spondylosis. IMPRESSION: 1. Airway thickening is present, suggesting bronchitis or reactive airways disease. Electronically Signed   By: Gaylyn Rong M.D.   On: 12/05/2015 20:54    Procedures Procedures (including critical care time)  Medications Ordered in ED Medications  albuterol (PROVENTIL) (2.5 MG/3ML) 0.083% nebulizer solution 5 mg (5 mg Nebulization Given 12/05/15 2049)  albuterol (PROVENTIL) (2.5 MG/3ML) 0.083% nebulizer solution 5 mg (5 mg Nebulization Given 12/05/15 2212)  predniSONE (DELTASONE) tablet 60 mg (60 mg Oral Given 12/05/15 2212)  albuterol (PROVENTIL HFA;VENTOLIN HFA) 108 (90 Base) MCG/ACT inhaler 4 puff (4 puffs Inhalation Given 12/05/15 2212)     Initial Impression / Assessment and Plan / ED Course  I have reviewed the triage vital signs and the nursing notes.  Pertinent labs & imaging results that were available during my care of the patient were reviewed by me and considered in my medical decision making (see chart for details).  Clinical Course   55 yo M with PMHx of mild intermittent asthma who p/w moderate SOB, wheezing after running out of his home albuterol inhaler. On arrival, pt with diffuse wheezing but normal  WOB, good aeration, speaking in full sentences. Suspect acute on chronic mild asthma exacerbation. He has an extensive h/o same and his sx feel similar to this, and correlate directly with running out of his home albuterol. Pt has had no fever, chills, and CXR shows no PNA - doubt PNA or infectious etiology. No LE edema, tachycardia, or signs of PE. No CP to suggest ACS or coronary etiology.  Sx completely resolved with breathing tx. He is ambulatory throughout ED without SOB or hypoxia. Feels much better. Will give prednisone here, albuterol, and advised PCP f/u.  Final Clinical Impressions(s) / ED Diagnoses   Final diagnoses:  Asthma exacerbation    New Prescriptions Discharge Medication List as of 12/05/2015 11:09 PM    START taking these medications   Details  !! albuterol (PROVENTIL HFA;VENTOLIN HFA) 108 (90 Base) MCG/ACT inhaler Inhale 1-2 puffs into the lungs every 6 (six) hours as needed for wheezing or shortness of breath., Starting Sun 12/05/2015, Print    predniSONE (DELTASONE) 20 MG tablet Take 3 tablets (60 mg total) by mouth daily., Starting Sun 12/05/2015, Until Fri 12/10/2015, Print     !! - Potential duplicate medications found. Please discuss with provider.       Shaune Pollack, MD 12/06/15 1031

## 2015-12-05 NOTE — ED Notes (Signed)
Pt ambulated with pulse oximetry up and down the hall way from Rm 12 to Rm 20, O2 SAT remained >92%, pt denied difficulty, shortness of breath and seemed to tolerate the activity. Reported improvement.

## 2015-12-05 NOTE — ED Notes (Signed)
Unable to collect EKG or accurate blood pressure patient is in to much discomfort and cannot hold still.

## 2015-12-05 NOTE — ED Triage Notes (Signed)
Pt present to ED POV c/o of asthma exacerbation. Pt is diaphoretic, unable to speak in complete sentences, and purse-lipped breathing. Pt states that he was at work when this started. Unable to use his inhaler at work. A&Ox4.

## 2016-01-05 ENCOUNTER — Encounter (HOSPITAL_COMMUNITY): Payer: Self-pay

## 2016-01-05 ENCOUNTER — Emergency Department (HOSPITAL_COMMUNITY)
Admission: EM | Admit: 2016-01-05 | Discharge: 2016-01-05 | Disposition: A | Discharge status: Home / Self Care | Payer: Self-pay | Attending: Emergency Medicine | Admitting: Emergency Medicine

## 2016-01-05 DIAGNOSIS — J45901 Unspecified asthma with (acute) exacerbation: Secondary | ICD-10-CM

## 2016-01-05 DIAGNOSIS — Z79899 Other long term (current) drug therapy: Secondary | ICD-10-CM | POA: Insufficient documentation

## 2016-01-05 MED ORDER — ALBUTEROL SULFATE (2.5 MG/3ML) 0.083% IN NEBU
5.0000 mg | INHALATION_SOLUTION | Freq: Once | RESPIRATORY_TRACT | Status: AC
  Administered 2016-01-05: 5 mg via RESPIRATORY_TRACT

## 2016-01-05 MED ORDER — PREDNISONE 20 MG PO TABS: 40.0000 mg | ORAL_TABLET | Freq: Every day | ORAL | 0 refills | Status: DC

## 2016-01-05 MED ORDER — ALBUTEROL SULFATE HFA 108 (90 BASE) MCG/ACT IN AERS
2.0000 | INHALATION_SPRAY | RESPIRATORY_TRACT | Status: DC | PRN
  Filled 2016-01-05: qty 6.7

## 2016-01-05 MED ORDER — ALBUTEROL SULFATE (2.5 MG/3ML) 0.083% IN NEBU
INHALATION_SOLUTION | RESPIRATORY_TRACT | Status: AC
  Filled 2016-01-05: qty 6

## 2016-01-05 MED ORDER — IPRATROPIUM-ALBUTEROL 0.5-2.5 (3) MG/3ML IN SOLN
3.0000 mL | Freq: Once | RESPIRATORY_TRACT | Status: AC
  Administered 2016-01-05: 3 mL via RESPIRATORY_TRACT
  Filled 2016-01-05: qty 3

## 2016-01-05 MED ORDER — PREDNISONE 20 MG PO TABS
60.0000 mg | ORAL_TABLET | Freq: Once | ORAL | Status: AC
  Administered 2016-01-05: 60 mg via ORAL
  Filled 2016-01-05: qty 3

## 2016-01-05 NOTE — ED Notes (Signed)
Pt given inhaler at time of discharge and demonstrated appropriate use.

## 2016-01-05 NOTE — ED Triage Notes (Signed)
Pt complaining of SOB and asthma. Pt states hx of same. Pt unsure trigger for this attack, states believes d/t weather.

## 2016-01-05 NOTE — ED Triage Notes (Signed)
Pt sats 85% on RA. Pt given 5mg  albuterol via face mask at triage.

## 2016-01-05 NOTE — ED Provider Notes (Signed)
[facility]-EMERGENCY DEPT Provider Note   CSN: [id number] Arrival date & time: 01/05/16  0138  By signing my name below, I, Emmanuella Mensah, attest that this documentation has been prepared under the direction and in the presence of Dione Boozeavid Quasim Doyon, MD. Electronically Signed: Angelene GiovanniEmmanuella Mensah, ED Scribe. 01/05/16. 2:21 AM.   History   Chief Complaint Chief Complaint  Patient presents with  . Shortness of Breath  . Asthma    HPI Comments: Jason Cardenas is a 55 y.o. male with a hx of asthma who presents to the Emergency Department complaining of sudden onset of moderate shortness of breath consistent with his asthma exacerbation onset 2 hours ago. He reports associated mild intermittent non-productive cough. No alleviating factors noted. Pt has not tried any medications PTA He states that he finished his inhaler within one month. He adds that he feels as though the inhaler would have provided relief PTA. He denies that he is a current smoker. He also denies any fever, chills, chest pain, nausea, or vomiting.   The history is provided by the patient. No language interpreter was used.  Shortness of Breath Associated symptoms include cough. Pertinent negatives include no fever, no headaches, no chest pain and no abdominal pain. Associated medical issues include asthma.  Asthma  This is a chronic problem. The current episode started 1 to 2 hours ago. The problem has been gradually worsening. Associated symptoms include shortness of breath. Pertinent negatives include no chest pain, no abdominal pain and no headaches. Nothing aggravates the symptoms. Nothing relieves the symptoms. He has tried nothing for the symptoms.    Past Medical History:  Diagnosis Date  . Asthma     There are no active problems to display for this patient.   Past Surgical History:  Procedure Laterality Date  . TONSILLECTOMY         Home Medications    Prior to Admission medications   Medication Sig Start Date  End Date Taking? Authorizing Provider  albuterol (PROVENTIL HFA;VENTOLIN HFA) 108 (90 Base) MCG/ACT inhaler Inhale 1-2 puffs into the lungs every 6 (six) hours as needed for wheezing or shortness of breath. 12/05/15  Yes Shaune Pollackameron Isaacs, MD  Multiple Vitamins-Minerals (MULTIVITAMIN WITH MINERALS) tablet Take 1 tablet by mouth daily.   Yes Historical Provider, MD  albuterol (PROVENTIL HFA;VENTOLIN HFA) 108 (90 Base) MCG/ACT inhaler Inhale 2 puffs into the lungs every 4 (four) hours as needed for wheezing or shortness of breath. Patient not taking: Reported on 01/05/2016 05/22/15   Jaynie Crumbleatyana Kirichenko, PA-C    Family History History reviewed. No pertinent family history.  Social History Social History  Substance Use Topics  . Smoking status: Never Smoker  . Smokeless tobacco: Never Used  . Alcohol use No     Allergies   Review of patient's allergies indicates no known allergies.   Review of Systems Review of Systems  Constitutional: Negative for chills and fever.  Respiratory: Positive for cough and shortness of breath.   Cardiovascular: Negative for chest pain.  Gastrointestinal: Negative for abdominal pain.  Neurological: Negative for headaches.  All other systems reviewed and are negative.    Physical Exam Updated Vital Signs Pulse 103   Temp 97.6 F (36.4 C) (Oral)   Resp 26   SpO2 93%   Physical Exam  Constitutional: He is oriented to person, place, and time. He appears well-developed and well-nourished.  HENT:  Head: Normocephalic and atraumatic.  Eyes: EOM are normal. Pupils are equal, round, and reactive to light.  Neck: Normal range of motion. Neck supple. No JVD present.  Cardiovascular: Normal rate, regular rhythm and normal heart sounds.   No murmur heard. Pulmonary/Chest: He has wheezes. He has no rales. He exhibits no tenderness.  Decreased air flow Slight wheezing with forced exhalation   Abdominal: Soft. Bowel sounds are normal. He exhibits no distension  and no mass. There is no tenderness.  Musculoskeletal: Normal range of motion. He exhibits no edema.  Lymphadenopathy:    He has no cervical adenopathy.  Neurological: He is alert and oriented to person, place, and time. No cranial nerve deficit. He exhibits normal muscle tone. Coordination normal.  Skin: Skin is warm and dry. No rash noted.  Psychiatric: He has a normal mood and affect. His behavior is normal. Judgment and thought content normal.  Nursing note and vitals reviewed.    ED Treatments / Results  DIAGNOSTIC STUDIES: Oxygen Saturation is 93% on RA, low by my interpretation.    COORDINATION OF CARE: 2:20 AM- Pt advised of plan for treatment and pt agrees. Pt will receive duo neb and prednisone. Will provide resources for PCP establishment.    Procedures Procedures (including critical care time)  Medications Ordered in ED Medications  albuterol (PROVENTIL HFA;VENTOLIN HFA) 108 (90 Base) MCG/ACT inhaler 2 puff (not administered)  albuterol (PROVENTIL) (2.5 MG/3ML) 0.083% nebulizer solution 5 mg ( Nebulization Canceled Entry 01/05/16 0236)  ipratropium-albuterol (DUONEB) 0.5-2.5 (3) MG/3ML nebulizer solution 3 mL (3 mLs Nebulization Given 01/05/16 0233)  predniSONE (DELTASONE) tablet 60 mg (60 mg Oral Given 01/05/16 0233)     Initial Impression / Assessment and Plan / ED Course  Dione Booze, MD has reviewed the triage vital signs and the nursing notes.  Pertinent labs & imaging results that were available during my care of the patient were reviewed by me and considered in my medical decision making (see chart for details).  Clinical Course    Asthma exacerbation related to his having run out of his inhaler. Old records are reviewed and he is been seen at approximately 1 month intervals which coincide with when he runs out of his inhaler. He is given a nebulizer treatment albuterol and ipratropium and given initial dose of prednisone. Following this, he feels much better. On  reexam, lungs are clear. He is given an albuterol inhaler to take home and is discharged with prescription for prednisone. He is given resource guide to help him arrange appropriate primary care.  Final Clinical Impressions(s) / ED Diagnoses   Final diagnoses:  Asthma exacerbation    New Prescriptions New Prescriptions   PREDNISONE (DELTASONE) 20 MG TABLET    Take 2 tablets (40 mg total) by mouth daily.   I personally performed the services described in this documentation, which was scribed in my presence. The recorded information has been reviewed and is accurate.       Dione Booze, MD 01/05/16 [phone]

## 2016-08-15 ENCOUNTER — Encounter (HOSPITAL_COMMUNITY): Payer: Self-pay | Admitting: Emergency Medicine

## 2016-08-15 ENCOUNTER — Emergency Department (HOSPITAL_COMMUNITY)
Admission: EM | Admit: 2016-08-15 | Discharge: 2016-08-15 | Disposition: A | Discharge status: Home / Self Care | Payer: 59 | Attending: Emergency Medicine | Admitting: Emergency Medicine

## 2016-08-15 ENCOUNTER — Emergency Department (HOSPITAL_COMMUNITY): Payer: 59

## 2016-08-15 DIAGNOSIS — J45909 Unspecified asthma, uncomplicated: Secondary | ICD-10-CM | POA: Diagnosis present

## 2016-08-15 DIAGNOSIS — J4521 Mild intermittent asthma with (acute) exacerbation: Secondary | ICD-10-CM

## 2016-08-15 MED ORDER — ALBUTEROL SULFATE (2.5 MG/3ML) 0.083% IN NEBU
5.0000 mg | INHALATION_SOLUTION | Freq: Once | RESPIRATORY_TRACT | Status: AC
  Administered 2016-08-15: 2.5 mg via RESPIRATORY_TRACT

## 2016-08-15 MED ORDER — IPRATROPIUM-ALBUTEROL 0.5-2.5 (3) MG/3ML IN SOLN
3.0000 mL | Freq: Once | RESPIRATORY_TRACT | Status: AC
  Administered 2016-08-15: 3 mL via RESPIRATORY_TRACT
  Filled 2016-08-15: qty 3

## 2016-08-15 MED ORDER — ALBUTEROL SULFATE HFA 108 (90 BASE) MCG/ACT IN AERS
2.0000 | INHALATION_SPRAY | Freq: Once | RESPIRATORY_TRACT | Status: AC
  Administered 2016-08-15: 2 via RESPIRATORY_TRACT
  Filled 2016-08-15: qty 6.7

## 2016-08-15 MED ORDER — ALBUTEROL SULFATE (2.5 MG/3ML) 0.083% IN NEBU
INHALATION_SOLUTION | RESPIRATORY_TRACT | Status: DC
Start: 2016-08-15 — End: 2016-08-15
  Filled 2016-08-15: qty 3

## 2016-08-15 NOTE — ED Provider Notes (Signed)
[facility]-EMERGENCY DEPT Provider Note   CSN: [id number] Arrival date & time: 08/15/16  7846     History   Chief Complaint Chief Complaint  Patient presents with  . Asthma    HPI Jason Cardenas is a 56 y.o. male.  HPI   Pt is 56 yo male with PMH of asthma who presents to the ED with complaint of asthma exacerbation, onset 5am. Pt reports after getting home from work having worsening asthma. Denies any known triggers but notes his asthma seems to worsen with the changes in weather. Denies smoking. Reports having wheezing, SOB and chest tightness. Denies fever, chills, cough, CP, abdominal pain, vomiting. Reports running out of his inhaler. Denies taking any meds PTA.   Past Medical History:  Diagnosis Date  . Asthma     There are no active problems to display for this patient.   Past Surgical History:  Procedure Laterality Date  . TONSILLECTOMY         Home Medications    Prior to Admission medications   Medication Sig Start Date End Date Taking? Authorizing Provider  Multiple Vitamins-Minerals (MULTIVITAMIN WITH MINERALS) tablet Take 1 tablet by mouth daily.    Historical Provider, MD  predniSONE (DELTASONE) 20 MG tablet Take 2 tablets (40 mg total) by mouth daily. 01/05/16   Dione Booze, MD    Family History No family history on file.  Social History Social History  Substance Use Topics  . Smoking status: Never Smoker  . Smokeless tobacco: Never Used  . Alcohol use No     Allergies   Patient has no known allergies.   Review of Systems Review of Systems  Respiratory: Positive for chest tightness, shortness of breath and wheezing.   All other systems reviewed and are negative.    Physical Exam Updated Vital Signs BP (!) 137/92   Pulse 85   Temp 97.5 F (36.4 C) (Oral)   Resp (!) 24   Ht  (1.778 m)   Wt 83.5 kg   SpO2 95%   BMI 26.40 kg/m   Physical Exam  Constitutional: He is oriented to person, place, and time. He appears  well-developed and well-nourished. No distress.  HENT:  Head: Normocephalic and atraumatic.  Mouth/Throat: Oropharynx is clear and moist. No oropharyngeal exudate.  Eyes: Conjunctivae and EOM are normal. Pupils are equal, round, and reactive to light. Right eye exhibits no discharge. Left eye exhibits no discharge. No scleral icterus.  Neck: Normal range of motion. Neck supple.  Cardiovascular: Normal rate, regular rhythm, normal heart sounds and intact distal pulses.   HR 88  Pulmonary/Chest: Effort normal. No respiratory distress. He has wheezes. He has no rales. He exhibits no tenderness.  Pt without increased work of breathing or tachypnea on exam. Mild wheezing throughout.   Abdominal: Soft. Bowel sounds are normal. He exhibits no distension and no mass. There is no tenderness. There is no rebound and no guarding. No hernia.  Musculoskeletal: Normal range of motion. He exhibits no edema.  Neurological: He is alert and oriented to person, place, and time.  Skin: Skin is warm and dry. He is not diaphoretic.  Nursing note and vitals reviewed.    ED Treatments / Results  Labs (all labs ordered are listed, but only abnormal results are displayed) Labs Reviewed - No data to display  EKG  EKG Interpretation None       Radiology Dg Chest 2 View  Result Date: 08/15/2016 CLINICAL DATA:  Shortness of breath. EXAM:  CHEST  2 VIEW COMPARISON:  No prior. FINDINGS: Mediastinum and hilar structures normal. Heart size normal . Mild left base and right upper lobe subsegmental atelectasis and/or scarring. No pleural effusion or pneumothorax. No acute bony abnormality. IMPRESSION: Mild left base and right upper lobe subsegmental atelectasis and/or scarring. Exam is otherwise unremarkable . No focal alveolar infiltrate. Electronically Signed   By: Maisie Fus  Register   On: 08/15/2016 08:53    Procedures Procedures (including critical care time)  Medications Ordered in ED Medications  albuterol  (PROVENTIL) (2.5 MG/3ML) 0.083% nebulizer solution 5 mg (2.5 mg Nebulization Given 08/15/16 0828)  ipratropium-albuterol (DUONEB) 0.5-2.5 (3) MG/3ML nebulizer solution 3 mL (3 mLs Nebulization Given 08/15/16 0923)  albuterol (PROVENTIL HFA;VENTOLIN HFA) 108 (90 Base) MCG/ACT inhaler 2 puff (2 puffs Inhalation Given 08/15/16 0923)     Initial Impression / Assessment and Plan / ED Course  I have reviewed the triage vital signs and the nursing notes.  Pertinent labs & imaging results that were available during my care of the patient were reviewed by me and considered in my medical decision making (see chart for details).     Patient presents with mild asthma exacerbation. Reports running out of it his albuterol inhaler at home. Denies fever. VSS. O2 saturations maintained >90 while in the ED, no current signs of respiratory distress. Lung exam improved after nebulizer treatment. Pt states they are breathing at baseline and feels much improved after neb treatments. Pt has been instructed to continue using prescribed medications and to speak with PCP about today's exacerbation.  Patient discharged home with albuterol inhaler. Discussed return precautions.  Final Clinical Impressions(s) / ED Diagnoses   Final diagnoses:  Mild intermittent asthma with exacerbation    New Prescriptions New Prescriptions   No medications on file     Barrett Henle, PA-C 08/15/16 2130    Jacalyn Lefevre, MD 08/15/16 1304

## 2016-08-15 NOTE — ED Triage Notes (Signed)
Pt presents to ED with hx of asthma and woke up this am with tightness and wheezing and pt states he has been out of inhaler for 1 month. Pt has mild wheezing noted with labored respirations.

## 2016-08-15 NOTE — Discharge Instructions (Signed)
Continue using her albuterol inhaler as prescribed as needed for shortness of breath and wheezing. Please follow up with a primary care provider from the Resource Guide provided below in one week for follow-up evaluation and further management of your asthma Please return to the Emergency Department if symptoms worsen or new onset of fever, difficulty breathing, chest pain, coughing up blood, vomiting.

## 2016-09-07 ENCOUNTER — Encounter (HOSPITAL_COMMUNITY): Payer: Self-pay | Admitting: Emergency Medicine

## 2016-09-07 ENCOUNTER — Emergency Department (HOSPITAL_COMMUNITY)
Admission: EM | Admit: 2016-09-07 | Discharge: 2016-09-07 | Disposition: A | Discharge status: Home / Self Care | Payer: 59 | Attending: Emergency Medicine | Admitting: Emergency Medicine

## 2016-09-07 DIAGNOSIS — R0602 Shortness of breath: Secondary | ICD-10-CM | POA: Diagnosis present

## 2016-09-07 DIAGNOSIS — J4521 Mild intermittent asthma with (acute) exacerbation: Secondary | ICD-10-CM

## 2016-09-07 MED ORDER — IPRATROPIUM-ALBUTEROL 0.5-2.5 (3) MG/3ML IN SOLN
3.0000 mL | Freq: Once | RESPIRATORY_TRACT | Status: AC
  Administered 2016-09-07: 3 mL via RESPIRATORY_TRACT
  Filled 2016-09-07: qty 3

## 2016-09-07 MED ORDER — PREDNISONE 20 MG PO TABS
60.0000 mg | ORAL_TABLET | Freq: Once | ORAL | Status: AC
  Administered 2016-09-07: 60 mg via ORAL
  Filled 2016-09-07: qty 3

## 2016-09-07 MED ORDER — ALBUTEROL SULFATE HFA 108 (90 BASE) MCG/ACT IN AERS: 2.0000 | INHALATION_SPRAY | Freq: Four times a day (QID) | RESPIRATORY_TRACT | 0 refills | Status: DC | PRN

## 2016-09-07 MED ORDER — PREDNISONE 20 MG PO TABS: ORAL_TABLET | ORAL | 0 refills | Status: DC

## 2016-09-07 MED ORDER — ALBUTEROL SULFATE (2.5 MG/3ML) 0.083% IN NEBU
5.0000 mg | INHALATION_SOLUTION | Freq: Once | RESPIRATORY_TRACT | Status: AC
  Administered 2016-09-07: 5 mg via RESPIRATORY_TRACT

## 2016-09-07 MED ORDER — ALBUTEROL SULFATE (2.5 MG/3ML) 0.083% IN NEBU
INHALATION_SOLUTION | RESPIRATORY_TRACT | Status: AC
  Administered 2016-09-07: 5 mg via RESPIRATORY_TRACT
  Filled 2016-09-07: qty 6

## 2016-09-07 NOTE — ED Provider Notes (Signed)
[facility] Provider Note   CSN: [id number] Arrival date & time: 09/07/16  0443  Time seen 05:38 AM   History   Chief Complaint Chief Complaint  Patient presents with  . Asthma    HPI Jason Cardenas is a 56 y.o. male.  HPI  patient relates he has had asthma since he was a child. He states about 9 PM he started getting short of breath with wheezing. He states he used his inhaler several times with only very brief improvement, stating only seconds. He states his chest feels tight, he denies coughing. He states he notices that when the humidity is high his asthma flares up. He also has been having some itching and watering of his eyes with puffiness of the skin around his eyes from the pollen. Patient has had one nebulizer treatment in triage and states his breathing is much improved. He states he has been given steroids in the past with flareups.  PCP none  Past Medical History:  Diagnosis Date  . Asthma     There are no active problems to display for this patient.   Past Surgical History:  Procedure Laterality Date  . TONSILLECTOMY         Home Medications    Prior to Admission medications   Medication Sig Start Date End Date Taking? Authorizing Provider  albuterol (PROVENTIL HFA;VENTOLIN HFA) 108 (90 Base) MCG/ACT inhaler Inhale 2 puffs into the lungs every 6 (six) hours as needed for wheezing or shortness of breath. 09/07/16   Devoria AlbeKnapp, Avyan Livesay, MD  predniSONE (DELTASONE) 20 MG tablet Take 3 po QD x 3d , then 2 po QD x 3d then 1 po QD x 3d 09/07/16   Devoria AlbeKnapp, Antara Brecheisen, MD    Family History No family history on file.  Social History Social History  Substance Use Topics  . Smoking status: Never Smoker  . Smokeless tobacco: Never Used  . Alcohol use No  employed   Allergies   Patient has no known allergies.   Review of Systems Review of Systems  All other systems reviewed and are negative.    Physical Exam Updated Vital Signs BP 134/82 (BP Location: Left  Arm)   Pulse (!) 108   Temp 98.7 F (37.1 C) (Oral)   Resp 16   Ht 5\' 10"  (1.778 m)   Wt 180 lb (81.6 kg)   SpO2 96%   BMI 25.83 kg/m   Vital signs normal except for tachycardia   Physical Exam  Constitutional: He is oriented to person, place, and time. He appears well-developed and well-nourished.  Non-toxic appearance. He does not appear ill. No distress.  HENT:  Head: Normocephalic and atraumatic.  Right Ear: External ear normal.  Left Ear: External ear normal.  Nose: Nose normal. No mucosal edema or rhinorrhea.  Mouth/Throat: Oropharynx is clear and moist and mucous membranes are normal. No dental abscesses or uvula swelling.  Patient's lower eyelids are extremely puffy without redness.  Eyes: Conjunctivae and EOM are normal. Pupils are equal, round, and reactive to light.  Neck: Normal range of motion and full passive range of motion without pain. Neck supple.  Cardiovascular: Normal rate, regular rhythm and normal heart sounds.  Exam reveals no gallop and no friction rub.   No murmur heard. Pulmonary/Chest: Effort normal. No respiratory distress. He has wheezes. He has no rhonchi. He has no rales. He exhibits no tenderness and no crepitus.  Patient has rare end expiratory wheeze  Abdominal: Soft. Normal appearance and bowel  sounds are normal. He exhibits no distension. There is no tenderness. There is no rebound and no guarding.  Musculoskeletal: Normal range of motion. He exhibits no edema or tenderness.  Moves all extremities well.   Neurological: He is alert and oriented to person, place, and time. He has normal strength. No cranial nerve deficit.  Skin: Skin is warm, dry and intact. No rash noted. No erythema. No pallor.  Psychiatric: He has a normal mood and affect. His speech is normal and behavior is normal. His mood appears not anxious.  Nursing note and vitals reviewed.    ED Treatments / Results  Labs (all labs ordered are listed, but only abnormal results are  displayed) Labs Reviewed - No data to display  EKG  EKG Interpretation None       Radiology No results found.  Procedures Procedures (including critical care time)  Medications Ordered in ED Medications  albuterol (PROVENTIL) (2.5 MG/3ML) 0.083% nebulizer solution 5 mg (5 mg Nebulization Given 09/07/16 0459)  ipratropium-albuterol (DUONEB) 0.5-2.5 (3) MG/3ML nebulizer solution 3 mL (3 mLs Nebulization Given 09/07/16 0552)  predniSONE (DELTASONE) tablet 60 mg (60 mg Oral Given 09/07/16 0551)     Initial Impression / Assessment and Plan / ED Course  I have reviewed the triage vital signs and the nursing notes.  Pertinent labs & imaging results that were available during my care of the patient were reviewed by me and considered in my medical decision making (see chart for details).   Patient had already received a albuterol nebulizer at the time of my exam and he had improved. He received a second nebulizer and was started on prednisone.  Recheck at 06:35 AM He has just finished his second nebulizer. He states he feels great, he feels back to normal. On exam he has improved air movement and he is no longer having any wheezing or rhonchi. Patient was discharged home.  Final Clinical Impressions(s) / ED Diagnoses   Final diagnoses:  Mild intermittent asthma with exacerbation    New Prescriptions New Prescriptions   ALBUTEROL (PROVENTIL HFA;VENTOLIN HFA) 108 (90 BASE) MCG/ACT INHALER    Inhale 2 puffs into the lungs every 6 (six) hours as needed for wheezing or shortness of breath.   PREDNISONE (DELTASONE) 20 MG TABLET    Take 3 po QD x 3d , then 2 po QD x 3d then 1 po QD x 3d    Plan discharge  Devoria Albe, MD, Concha Pyo, MD 09/07/16 [phone]

## 2016-09-07 NOTE — Discharge Instructions (Signed)
Use your inhaler as needed for wheezing or shortness of breath. Take the prednisone until gone.   Recheck if you get worse again. Try to get a primary care doctor to help your manage your asthma.

## 2016-09-07 NOTE — ED Triage Notes (Signed)
Patient reports asthma attack with wheezing onset last night unrelieved by metered dose inhaler , denies cough or fever .

## 2016-09-17 ENCOUNTER — Emergency Department (HOSPITAL_COMMUNITY)
Admission: EM | Admit: 2016-09-17 | Discharge: 2016-09-17 | Disposition: A | Discharge status: Home / Self Care | Payer: Managed Care, Other (non HMO) | Attending: Emergency Medicine | Admitting: Emergency Medicine

## 2016-09-17 ENCOUNTER — Encounter (HOSPITAL_COMMUNITY): Payer: Self-pay | Admitting: Emergency Medicine

## 2016-09-17 DIAGNOSIS — Z79899 Other long term (current) drug therapy: Secondary | ICD-10-CM | POA: Diagnosis not present

## 2016-09-17 DIAGNOSIS — J45909 Unspecified asthma, uncomplicated: Secondary | ICD-10-CM | POA: Diagnosis present

## 2016-09-17 DIAGNOSIS — J45901 Unspecified asthma with (acute) exacerbation: Secondary | ICD-10-CM

## 2016-09-17 MED ORDER — ALBUTEROL SULFATE (2.5 MG/3ML) 0.083% IN NEBU
INHALATION_SOLUTION | RESPIRATORY_TRACT | Status: AC
  Administered 2016-09-17: 5 mg via RESPIRATORY_TRACT
  Filled 2016-09-17: qty 6

## 2016-09-17 MED ORDER — ALBUTEROL SULFATE (2.5 MG/3ML) 0.083% IN NEBU
5.0000 mg | INHALATION_SOLUTION | Freq: Once | RESPIRATORY_TRACT | Status: AC
  Administered 2016-09-17: 5 mg via RESPIRATORY_TRACT

## 2016-09-17 MED ORDER — DEXAMETHASONE 6 MG PO TABS: 12.0000 mg | ORAL_TABLET | Freq: Once | ORAL | 0 refills | Status: AC

## 2016-09-17 MED ORDER — ALBUTEROL SULFATE HFA 108 (90 BASE) MCG/ACT IN AERS
2.0000 | INHALATION_SPRAY | Freq: Once | RESPIRATORY_TRACT | Status: AC
  Administered 2016-09-17: 2 via RESPIRATORY_TRACT
  Filled 2016-09-17: qty 6.7

## 2016-09-17 MED ORDER — DEXAMETHASONE 4 MG PO TABS
12.0000 mg | ORAL_TABLET | Freq: Once | ORAL | Status: AC
  Administered 2016-09-17: 12 mg via ORAL
  Filled 2016-09-17: qty 3

## 2016-09-17 NOTE — ED Notes (Signed)
Declined W/C at D/C and was escorted to lobby by RN. 

## 2016-09-17 NOTE — ED Triage Notes (Signed)
Pt. reports asthma attack onset last night with wheezing and dry cough unrelieved by metered dose inhaler , denies fever or chills .

## 2016-09-17 NOTE — ED Provider Notes (Signed)
[facility] Provider Note   CSN: [id number] Arrival date & time: 09/17/16  0610     History   Chief Complaint Chief Complaint  Patient presents with  . Asthma    HPI Jason Cardenas is a 56 y.o. male.  HPI   55yM with dyspnea. Hx of asthma. He feels like he is having a "flare." COughing. Wheezing. Nonproductive. No fever or chills. No unusual leg pain or swelling. Inhaler helps but has to use is several times per day.   Past Medical History:  Diagnosis Date  . Asthma     There are no active problems to display for this patient.   Past Surgical History:  Procedure Laterality Date  . TONSILLECTOMY         Home Medications    Prior to Admission medications   Medication Sig Start Date End Date Taking? Authorizing Provider  albuterol (PROVENTIL HFA;VENTOLIN HFA) 108 (90 Base) MCG/ACT inhaler Inhale 2 puffs into the lungs every 6 (six) hours as needed for wheezing or shortness of breath. 09/07/16  Yes Devoria AlbeKnapp, Iva, MD  predniSONE (DELTASONE) 20 MG tablet Take 3 po QD x 3d , then 2 po QD x 3d then 1 po QD x 3d Patient not taking: Reported on 09/17/2016 09/07/16   Devoria AlbeKnapp, Iva, MD    Family History No family history on file.  Social History Social History  Substance Use Topics  . Smoking status: Never Smoker  . Smokeless tobacco: Never Used  . Alcohol use No     Allergies   Patient has no known allergies.   Review of Systems Review of Systems  All systems reviewed and negative, other than as noted in HPI.  Physical Exam Updated Vital Signs BP (!) 134/92 (BP Location: Left Arm)   Pulse 98   Temp 98.1 F (36.7 C) (Oral)   Resp 16   Ht 5\' 10"  (1.778 m)   Wt 77.6 kg (171 lb)   SpO2 95%   BMI 24.54 kg/m   Physical Exam  Constitutional: He appears well-developed and well-nourished. No distress.  HENT:  Head: Normocephalic and atraumatic.  Eyes: Conjunctivae are normal. Right eye exhibits no discharge. Left eye exhibits no discharge.  Neck:  Neck supple.  Cardiovascular: Normal rate, regular rhythm and normal heart sounds.  Exam reveals no gallop and no friction rub.   No murmur heard. Pulmonary/Chest: No respiratory distress.  Speaks in complete sentences. B/l wheezing.  Abdominal: Soft. He exhibits no distension. There is no tenderness.  Musculoskeletal: He exhibits no edema or tenderness.  Neurological: He is alert.  Skin: Skin is warm and dry.  Psychiatric: He has a normal mood and affect. His behavior is normal. Thought content normal.  Nursing note and vitals reviewed.    ED Treatments / Results  Labs (all labs ordered are listed, but only abnormal results are displayed) Labs Reviewed - No data to display  EKG  EKG Interpretation None       Radiology No results found.  Procedures Procedures (including critical care time)  Medications Ordered in ED Medications  albuterol (PROVENTIL HFA;VENTOLIN HFA) 108 (90 Base) MCG/ACT inhaler 2 puff (not administered)  dexamethasone (DECADRON) tablet 12 mg (not administered)  albuterol (PROVENTIL) (2.5 MG/3ML) 0.083% nebulizer solution 5 mg (5 mg Nebulization Given 09/17/16 0626)     Initial Impression / Assessment and Plan / ED Course  I have reviewed the triage vital signs and the nursing notes.  Pertinent labs & imaging results that were available during  my care of the patient were reviewed by me and considered in my medical decision making (see chart for details).     56 year old male with dyspnea and cough which she attributes to asthma.  He does have some mild wheezing on exam. Overall, he appears comfortable. He did receive an albuterol that prior to my evaluation. He has no increased work of breathing.  He will be provided with another albuterol MDI. Will also give him a course of steroids.   Hx/exam is consistent with mild asthma exacerbation. I have a low suspicion for PE, ACS, pneumonia, etc. Further testing/imaging deferred.   He recently obtained  health insurance. Advised him that he needs to try to establish a PCP soon as he can. Return precautions discussed.  Final Clinical Impressions(s) / ED Diagnoses   Final diagnoses:  Exacerbation of asthma, unspecified asthma severity, unspecified whether persistent    New Prescriptions New Prescriptions   No medications on file     Raeford Razor, MD 09/24/16 [phone]

## 2016-10-07 ENCOUNTER — Emergency Department (HOSPITAL_COMMUNITY): Payer: 59

## 2016-10-07 ENCOUNTER — Emergency Department (HOSPITAL_COMMUNITY)
Admission: EM | Admit: 2016-10-07 | Discharge: 2016-10-07 | Disposition: A | Discharge status: Home / Self Care | Payer: 59 | Attending: Emergency Medicine | Admitting: Emergency Medicine

## 2016-10-07 ENCOUNTER — Encounter (HOSPITAL_COMMUNITY): Payer: Self-pay | Admitting: Emergency Medicine

## 2016-10-07 DIAGNOSIS — J4521 Mild intermittent asthma with (acute) exacerbation: Secondary | ICD-10-CM

## 2016-10-07 DIAGNOSIS — R0602 Shortness of breath: Secondary | ICD-10-CM | POA: Diagnosis present

## 2016-10-07 MED ORDER — IPRATROPIUM BROMIDE 0.02 % IN SOLN
0.5000 mg | Freq: Once | RESPIRATORY_TRACT | Status: AC
  Administered 2016-10-07: 0.5 mg via RESPIRATORY_TRACT
  Filled 2016-10-07: qty 2.5

## 2016-10-07 MED ORDER — PREDNISONE 20 MG PO TABS: ORAL_TABLET | ORAL | 0 refills | Status: DC

## 2016-10-07 MED ORDER — ALBUTEROL SULFATE (2.5 MG/3ML) 0.083% IN NEBU
5.0000 mg | INHALATION_SOLUTION | Freq: Once | RESPIRATORY_TRACT | Status: AC
  Administered 2016-10-07: 5 mg via RESPIRATORY_TRACT

## 2016-10-07 MED ORDER — ALBUTEROL SULFATE (2.5 MG/3ML) 0.083% IN NEBU
5.0000 mg | INHALATION_SOLUTION | Freq: Once | RESPIRATORY_TRACT | Status: AC
  Administered 2016-10-07: 5 mg via RESPIRATORY_TRACT
  Filled 2016-10-07: qty 6

## 2016-10-07 MED ORDER — PREDNISONE 20 MG PO TABS
60.0000 mg | ORAL_TABLET | Freq: Once | ORAL | Status: AC
  Administered 2016-10-07: 60 mg via ORAL
  Filled 2016-10-07: qty 3

## 2016-10-07 MED ORDER — ALBUTEROL SULFATE (2.5 MG/3ML) 0.083% IN NEBU
INHALATION_SOLUTION | RESPIRATORY_TRACT | Status: AC
  Filled 2016-10-07: qty 6

## 2016-10-07 MED ORDER — ALBUTEROL SULFATE HFA 108 (90 BASE) MCG/ACT IN AERS
2.0000 | INHALATION_SPRAY | Freq: Once | RESPIRATORY_TRACT | Status: AC
  Administered 2016-10-07: 2 via RESPIRATORY_TRACT
  Filled 2016-10-07: qty 6.7

## 2016-10-07 NOTE — ED Notes (Signed)
Pt called back for room x2 with no response

## 2016-10-07 NOTE — Discharge Instructions (Signed)
Take prednisone as prescribed.   Use albuterol as needed for cough or wheezing   See your doctor  Return to ER if you have worse trouble breathing, wheezing, fever, cough

## 2016-10-07 NOTE — ED Notes (Signed)
Pt came to nurse first.  St's he had been asleep and did not hear anyone call his name.

## 2016-10-07 NOTE — ED Notes (Signed)
Called several times in waiting room, unable to locate patient

## 2016-10-07 NOTE — ED Triage Notes (Signed)
Pt c/o wheezing and sob, states his asthma is flaring up and someone stole his inhaler. Lung sounds wheezing. VSS

## 2016-10-07 NOTE — ED Provider Notes (Signed)
[facility] Provider Note   CSN: [id number] Arrival date & time: 10/07/16  1659     History   Chief Complaint Chief Complaint  Patient presents with  . Shortness of Breath    HPI Jason Cardenas is a 56 y.o. male history asthma here presenting with cough, wheezing. Patient states that he started coughing earlier today and had some wheezing as well. He states that he was in the ER about a week ago for asthma exacerbation and was prescribed some steroids and an albuterol pump. He actually left his pump at a friend's house and was gone when he tried to go pick it up. Denies any fevers or chills or cough.   The history is provided by the patient.    Past Medical History:  Diagnosis Date  . Asthma     There are no active problems to display for this patient.   Past Surgical History:  Procedure Laterality Date  . TONSILLECTOMY         Home Medications    Prior to Admission medications   Medication Sig Start Date End Date Taking? Authorizing Provider  albuterol (PROVENTIL HFA;VENTOLIN HFA) 108 (90 Base) MCG/ACT inhaler Inhale 2 puffs into the lungs every 6 (six) hours as needed for wheezing or shortness of breath. 09/07/16   Devoria AlbeKnapp, Iva, MD  predniSONE (DELTASONE) 20 MG tablet Take 3 po QD x 3d , then 2 po QD x 3d then 1 po QD x 3d Patient not taking: Reported on 09/17/2016 09/07/16   Devoria AlbeKnapp, Iva, MD    Family History No family history on file.  Social History Social History  Substance Use Topics  . Smoking status: Never Smoker  . Smokeless tobacco: Never Used  . Alcohol use No     Allergies   Patient has no known allergies.   Review of Systems Review of Systems  Respiratory: Positive for cough and wheezing.   All other systems reviewed and are negative.    Physical Exam Updated Vital Signs BP (!) 143/94 (BP Location: Right Arm)   Pulse 78   Temp 97.9 F (36.6 C) (Oral)   Resp 14   SpO2 99%   Physical Exam  Constitutional: He is oriented to  person, place, and time. He appears well-developed and well-nourished.  HENT:  Head: Normocephalic.  Mouth/Throat: Oropharynx is clear and moist.  Eyes: Conjunctivae and EOM are normal. Pupils are equal, round, and reactive to light.  Neck: Normal range of motion. Neck supple.  Cardiovascular: Normal rate, regular rhythm and normal heart sounds.   Pulmonary/Chest:  Diminished throughout, minimal wheezing, mod air movement   Abdominal: Soft. Bowel sounds are normal.  Musculoskeletal: Normal range of motion.  Neurological: He is alert and oriented to person, place, and time.  Skin: Skin is warm.  Psychiatric: He has a normal mood and affect.  Nursing note and vitals reviewed.    ED Treatments / Results  Labs (all labs ordered are listed, but only abnormal results are displayed) Labs Reviewed - No data to display  EKG  EKG Interpretation  Date/Time:  Saturday October 07 2016 17:09:12 EDT Ventricular Rate:  91 PR Interval:  162 QRS Duration: 82 QT Interval:  374 QTC Calculation: 460 R Axis:   92 Text Interpretation:  Normal sinus rhythm Rightward axis Borderline ECG No significant change since last tracing Confirmed by Richardean CanalYao, Kenrick Pore H [phone]([id number]) on 10/07/2016 9:07:59 PM       Radiology Dg Chest 2 View  Result Date: 10/07/2016 CLINICAL  DATA:  Wheezing and shortness of breath EXAM: CHEST  2 VIEW COMPARISON:  08/15/2016 FINDINGS: The heart size and mediastinal contours are within normal limits. Both lungs are clear. The visualized skeletal structures are unremarkable. IMPRESSION: No active cardiopulmonary disease. Electronically Signed   By: Jasmine Pang M.D.   On: 10/07/2016 18:03    Procedures Procedures (including critical care time)  Medications Ordered in ED Medications  albuterol (PROVENTIL) (2.5 MG/3ML) 0.083% nebulizer solution (not administered)  albuterol (PROVENTIL) (2.5 MG/3ML) 0.083% nebulizer solution 5 mg (5 mg Nebulization Given 10/07/16 1709)  albuterol (PROVENTIL)  (2.5 MG/3ML) 0.083% nebulizer solution 5 mg (5 mg Nebulization Given 10/07/16 2109)  ipratropium (ATROVENT) nebulizer solution 0.5 mg (0.5 mg Nebulization Given 10/07/16 2109)  predniSONE (DELTASONE) tablet 60 mg (60 mg Oral Given 10/07/16 2108)     Initial Impression / Assessment and Plan / ED Course  I have reviewed the triage vital signs and the nursing notes.  Pertinent labs & imaging results that were available during my care of the patient were reviewed by me and considered in my medical decision making (see chart for details).    Jason Cardenas is a 56 y.o. male here with cough, wheezing. Likely asthma exacerbation. Afebrile, well appearing. Will give nebs, steroids. Will get CXR.   10:28 PM CXR clear. O2 stable. Minimal wheezing after 2 nebs, steroids. Will refill albuterol pump, give course of prednisone.    Final Clinical Impressions(s) / ED Diagnoses   Final diagnoses:  None    New Prescriptions New Prescriptions   No medications on file     Charlynne Pander, MD 10/07/16 2228

## 2016-10-18 ENCOUNTER — Emergency Department (HOSPITAL_COMMUNITY)
Admission: EM | Admit: 2016-10-18 | Discharge: 2016-10-18 | Disposition: A | Discharge status: Home / Self Care | Payer: 59 | Attending: Emergency Medicine | Admitting: Emergency Medicine

## 2016-10-18 ENCOUNTER — Encounter (HOSPITAL_COMMUNITY): Payer: Self-pay | Admitting: Emergency Medicine

## 2016-10-18 ENCOUNTER — Emergency Department (HOSPITAL_COMMUNITY): Payer: 59

## 2016-10-18 DIAGNOSIS — J4521 Mild intermittent asthma with (acute) exacerbation: Secondary | ICD-10-CM

## 2016-10-18 DIAGNOSIS — Z7951 Long term (current) use of inhaled steroids: Secondary | ICD-10-CM | POA: Insufficient documentation

## 2016-10-18 DIAGNOSIS — R0602 Shortness of breath: Secondary | ICD-10-CM | POA: Diagnosis present

## 2016-10-18 MED ORDER — METHYLPREDNISOLONE SODIUM SUCC 125 MG IJ SOLR
125.0000 mg | Freq: Once | INTRAMUSCULAR | Status: AC
  Administered 2016-10-18: 125 mg via INTRAVENOUS
  Filled 2016-10-18: qty 2

## 2016-10-18 MED ORDER — IPRATROPIUM-ALBUTEROL 0.5-2.5 (3) MG/3ML IN SOLN
3.0000 mL | Freq: Once | RESPIRATORY_TRACT | Status: AC
  Administered 2016-10-18: 3 mL via RESPIRATORY_TRACT
  Filled 2016-10-18: qty 3

## 2016-10-18 MED ORDER — PREDNISONE 10 MG (21) PO TBPK
ORAL_TABLET | Freq: Every day | ORAL | 0 refills | Status: DC
Start: 2016-10-18 — End: 2016-11-03

## 2016-10-18 MED ORDER — ALBUTEROL SULFATE (2.5 MG/3ML) 0.083% IN NEBU
5.0000 mg | INHALATION_SOLUTION | Freq: Once | RESPIRATORY_TRACT | Status: AC
  Administered 2016-10-18: 5 mg via RESPIRATORY_TRACT
  Filled 2016-10-18: qty 6

## 2016-10-18 NOTE — ED Notes (Signed)
ED Provider at bedside. 

## 2016-10-18 NOTE — Discharge Instructions (Signed)
take prednisone as prescribed until all gone. Use inhaler 2 puffs every 4 hours. Follow-up with a family doctor for further evaluation and treatment. Return if worsening

## 2016-10-18 NOTE — ED Provider Notes (Signed)
[facility] Provider Note   CSN: [id number] Arrival date & time: 10/18/16  7829     History   Chief Complaint Chief Complaint  Patient presents with  . Asthma  . Shortness of Breath    HPI Jason Cardenas is a 56 y.o. male.  HPI Jason Cardenas is a 56 y.o. male presents to ED with complaint ofShortness of breath. Patient states his symptoms began early this morning while at work. Patient works third shift as a Estate agent. He reports wheezing, difficulty breathing, dry nonproductive cough. He reports history of asthma and states he has had increasingly more problems with it in the last few months. He is not a smoker. He denies being around smokers, denies being around any fumes or dust. He states sometimes temperature changes and seasonal allergies can cause exacerbation of his asthma. He used his inhaler several times prior to coming in which did not help. He denies any recent illnesses. No fever or chills. Denies any chest pain. No prior cardiac problems. He reports wheezing. He states this feels like prior asthma exacerbation. Reports last asthma exacerbation just 2 weeks ago, that time treated with 5 days of steroids. States it improved.  Past Medical History:  Diagnosis Date  . Asthma     There are no active problems to display for this patient.   Past Surgical History:  Procedure Laterality Date  . TONSILLECTOMY         Home Medications    Prior to Admission medications   Medication Sig Start Date End Date Taking? Authorizing Provider  albuterol (PROVENTIL HFA;VENTOLIN HFA) 108 (90 Base) MCG/ACT inhaler Inhale 2 puffs into the lungs every 6 (six) hours as needed for wheezing or shortness of breath. 09/07/16   Devoria Albe, MD  predniSONE (DELTASONE) 20 MG tablet Take 60 mg daily x 2 days then 40 mg daily x 2 days then 20 mg daily x 2 days 10/07/16   Charlynne Pander, MD    Family History History reviewed. No pertinent family history.  Social  History Social History  Substance Use Topics  . Smoking status: Never Smoker  . Smokeless tobacco: Never Used  . Alcohol use No     Allergies   Patient has no known allergies.   Review of Systems Review of Systems  Constitutional: Negative for chills and fever.  Respiratory: Positive for chest tightness, shortness of breath and wheezing. Negative for cough.   Cardiovascular: Negative for chest pain, palpitations and leg swelling.  Gastrointestinal: Negative for abdominal distention, abdominal pain, diarrhea, nausea and vomiting.  Musculoskeletal: Negative for arthralgias, myalgias, neck pain and neck stiffness.  Skin: Negative for rash.  Allergic/Immunologic: Negative for immunocompromised state.  Neurological: Negative for dizziness, weakness, light-headedness, numbness and headaches.  All other systems reviewed and are negative.    Physical Exam Updated Vital Signs BP (!) 149/93 (BP Location: Right Arm)   Pulse (!) 105   Temp 98.4 F (36.9 C) (Oral)   Resp 16   SpO2 98%   Physical Exam  Constitutional: He appears well-developed and well-nourished. No distress.  HENT:  Head: Normocephalic and atraumatic.  Eyes: Conjunctivae are normal.  Neck: Neck supple.  Cardiovascular: Normal rate, regular rhythm and normal heart sounds.   Pulmonary/Chest: No respiratory distress. He has wheezes. He has no rales.  Decreased air movement bilaterally, inspiratory and expiratory wheezes in all lung fields. Speaking in full sentences. Mild accessory muscle use  Abdominal: Soft. Bowel sounds are normal. He exhibits no  distension. There is no tenderness. There is no rebound.  Musculoskeletal: He exhibits no edema.  Neurological: He is alert.  Skin: Skin is warm and dry.  Nursing note and vitals reviewed.    ED Treatments / Results  Labs (all labs ordered are listed, but only abnormal results are displayed) Labs Reviewed - No data to display  EKG  EKG  Interpretation  Date/Time:  Wednesday October 18 2016 06:20:37 EDT Ventricular Rate:  100 PR Interval:    QRS Duration: 83 QT Interval:  344 QTC Calculation: 444 R Axis:   87 Text Interpretation:  Sinus tachycardia Left atrial enlargement Baseline wander in lead(s) V3 When compared with ECG of 10/07/2016, No significant change was found Confirmed by Dione BoozeGlick, David ([id number]) on 10/18/2016 6:24:37 AM       Radiology Dg Chest 2 View  Result Date: 10/18/2016 CLINICAL DATA:  Wheezing and dyspnea and 03:00 EXAM: CHEST  2 VIEW COMPARISON:  10/07/2016 FINDINGS: Moderate hyperinflation. The lungs are clear. The pulmonary vasculature is normal. No pleural effusions. Normal heart size. Hilar and mediastinal contours are unremarkable and unchanged. IMPRESSION: Hyperinflation.  No consolidation or effusion.  Normal vasculature. Electronically Signed   By: Ellery Plunkaniel R Mitchell M.D.   On: 10/18/2016 06:55    Procedures Procedures (including critical care time)  Medications Ordered in ED Medications  methylPREDNISolone sodium succinate (SOLU-MEDROL) 125 mg/2 mL injection 125 mg (not administered)  albuterol (PROVENTIL) (2.5 MG/3ML) 0.083% nebulizer solution 5 mg (5 mg Nebulization Given 10/18/16 [id number])     Initial Impression / Assessment and Plan / ED Course  I have reviewed the triage vital signs and the nursing notes.  Pertinent labs & imaging results that were available during my care of the patient were reviewed by me and considered in my medical decision making (see chart for details).     6:39 AM seen and examined. Pt with wheezing, SOB, hx of asthma, feels the same. Will try duo nebs, solumedrol, CXR.  ECG unchanged  Pt feels better after 3 nebs. CXR negative other than hyperinflation. Will start on steroids. Home with pcp follow up.   Vitals:   10/18/16 0700 10/18/16 0712 10/18/16 0715 10/18/16 0853  BP: (!) 158/94 (!) 158/94 (!) 153/89 (!) 154/93  Pulse: 96 95 95 (!) 106  Resp: (!) 21 16 17  19   Temp:    98.3 F (36.8 C)  TempSrc:    Oral  SpO2: 100% 100% 97% 96%     Final Clinical Impressions(s) / ED Diagnoses   Final diagnoses:  Mild intermittent asthma with exacerbation    New Prescriptions Discharge Medication List as of 10/18/2016  8:47 AM    START taking these medications   Details  predniSONE (STERAPRED UNI-PAK 21 TAB) 10 MG (21) TBPK tablet Take by mouth daily. Take 6 tabs by mouth daily  for 2 days, then 5 tabs for 2 days, then 4 tabs for 2 days, then 3 tabs for 2 days, 2 tabs for 2 days, then 1 tab by mouth daily for 2 days, Starting Wed 10/18/2016, Print         Jaynie CrumbleKirichenko, Lupie Sawa, PA-C 10/18/16 1528    Raeford RazorKohut, Stephen, MD 10/28/16 [phone]

## 2016-10-18 NOTE — ED Triage Notes (Signed)
Pt came in straight from work. States he started feeling SOB about 0300. Used rescue inhaler with no improvement. Hx asthma. Denies N/V, CP or other pain complaint.

## 2016-10-18 NOTE — ED Notes (Signed)
Pt completed breathing treatment.

## 2016-10-18 NOTE — ED Notes (Signed)
Patient transported to X-ray 

## 2016-11-02 NOTE — Progress Notes (Signed)
Patient ID: Jason Cardenas, male   DOB: 06/06/1960, 56 y.o.   MRN: [id number]    Jason Cardenas, is a 56 y.o. male  WUJ:[id number]  NFA:[id number]  DOB - 1961-02-21  Subjective:  Chief Complaint and HPI: Jason Cardenas is a 56 y.o. male here today to establish care and for a follow up visit after being seen in the ED multiple times over the last several months.  Most recently was 7/72018 and he was seen for SOB.  He denied CP.  He said it felt like prior episodes of asthma exacerbations.  Cardiac work-up did not reveal acute ischemia.  He was treated with soul-medrol and discharged on prednisone. He is feeling much better today.  He has been having a lot of trouble with wheezing over the last 6-8 months and asthma has been poorly controlled.  He has had to use his rescue inhaler multiple times per week.    He also c/o growth for many years on his R lower leg.  It does not bother him and is not painful, but it does seem to be getting larger and getting in the way of clothing,etc at times.    He would like to establish care and schedule a physical soon.  ED/Hospital notes reviewed.   Social History: Family history:  ROS:   Constitutional:  No f/c, No night sweats, No unexplained weight loss. EENT:  No vision changes, No blurry vision, No hearing changes. No mouth, throat, or ear problems.  Respiratory: No cough, No SOB Cardiac: No CP, no palpitations GI:  No abd pain, No N/V/D. GU: No Urinary s/sx Musculoskeletal: No joint pain Neuro: No headache, no dizziness, no motor weakness.  Skin: No rash Endocrine:  No polydipsia. No polyuria.  Psych: Denies SI/HI  No problems updated.  ALLERGIES: No Known Allergies  PAST MEDICAL HISTORY: Past Medical History:  Diagnosis Date  . Asthma     MEDICATIONS AT HOME: Prior to Admission medications   Medication Sig Start Date End Date Taking? Authorizing Provider  albuterol (PROVENTIL HFA;VENTOLIN HFA) 108 (90 Base) MCG/ACT inhaler Inhale  2 puffs into the lungs every 6 (six) hours as needed for wheezing or shortness of breath. 11/03/16  Yes Anders Simmonds, PA-C  Fluticasone-Salmeterol (ADVAIR) 250-50 MCG/DOSE AEPB 1 puff 2 times daily 11/03/16   Anders Simmonds, PA-C     Objective:  EXAM:   Vitals:   11/03/16 0851  BP: 119/76  Pulse: 79  Resp: 18  Temp: (!) 97.5 F (36.4 C)  TempSrc: Oral  SpO2: 97%  Weight: 181 lb (82.1 kg)  Height: 5\' 10"  (1.778 m)    General appearance : A&OX3. NAD. Non-toxic-appearing HEENT: Atraumatic and Normocephalic.  PERRLA. EOM intact.  TM clear B. Mouth-MMM, post pharynx WNL w/o erythema, No PND. Neck: supple, no JVD. No cervical lymphadenopathy. No thyromegaly Chest/Lungs:  Breathing-non-labored, Good air entry bilaterally, breath sounds normal without rales, rhonchi, or wheezing  CVS: S1 S2 regular, no murmurs, gallops, rubs Extremities: Bilateral Lower Ext shows no edema, both legs are warm to touch with = pulse throughout Neurology:  CN II-XII grossly intact, Non focal.   Psych:  TP linear. J/I WNL. Normal speech. Appropriate eye contact and affect.  Skin:  No Rash  Data Review Lab Results  Component Value Date   HGBA1C 5.9 11/03/2016     Assessment & Plan   1. Moderate persistent asthma, unspecified whether complicated Uncontrolled-need for management.  Lungs clear today but just finished prednisone. - Fluticasone-Salmeterol (ADVAIR)  250-50 MCG/DOSE AEPB; 1 puff 2 times daily  Dispense: 60 each; Refill: 2 - albuterol (PROVENTIL HFA;VENTOLIN HFA) 108 (90 Base) MCG/ACT inhaler; Inhale 2 puffs into the lungs every 6 (six) hours as needed for wheezing or shortness of breath.  Dispense: 6.7 g; Refill: 0  2. Hyperglycemia No diabetes-recent prednisone use - Glucose (CBG) - HgB A1c I have had a lengthy discussion and provided education about insulin resistance and the intake of too much sugar/refined carbohydrates.  I have advised the patient to work at a goal of  eliminating sugary drinks, candy, desserts, sweets, refined sugars, processed foods, and white carbohydrates.  The patient expresses understanding.    3. Skin lump of leg, right - Ambulatory referral to General Surgery   Patient have been counseled extensively about nutrition and exercise  Return in about 1 month (around 12/04/2016) for assign PCP; CPE with fasting blood work.  The patient was given clear instructions to go to ER or return to medical center if symptoms don't improve, worsen or new problems develop. The patient verbalized understanding. The patient was told to call to get lab results if they haven't heard anything in the next week.     Georgian CoAngela Londyn Wotton, PA-C [facility] [place], [place] [phone]   11/03/2016, 9:06 AM

## 2016-11-03 ENCOUNTER — Ambulatory Visit: Payer: 59 | Attending: Internal Medicine | Admitting: Physician Assistant

## 2016-11-03 ENCOUNTER — Encounter: Payer: Self-pay | Admitting: Physician Assistant

## 2016-11-03 VITALS — BP 119/76 | HR 79 | Temp 97.5°F | Resp 18 | Ht 70.0 in | Wt 181.0 lb

## 2016-11-03 DIAGNOSIS — R2241 Localized swelling, mass and lump, right lower limb: Secondary | ICD-10-CM

## 2016-11-03 DIAGNOSIS — J4540 Moderate persistent asthma, uncomplicated: Secondary | ICD-10-CM | POA: Diagnosis not present

## 2016-11-03 DIAGNOSIS — R739 Hyperglycemia, unspecified: Secondary | ICD-10-CM | POA: Diagnosis not present

## 2016-11-03 LAB — POCT GLYCOSYLATED HEMOGLOBIN (HGB A1C): Hemoglobin A1C: 5.9

## 2016-11-03 LAB — GLUCOSE, POCT (MANUAL RESULT ENTRY): POC Glucose: 95 mg/dl (ref 70–99)

## 2016-11-03 MED ORDER — ALBUTEROL SULFATE HFA 108 (90 BASE) MCG/ACT IN AERS: 2.0000 | INHALATION_SPRAY | Freq: Four times a day (QID) | RESPIRATORY_TRACT | 0 refills | Status: DC | PRN

## 2016-11-03 MED ORDER — FLUTICASONE-SALMETEROL 250-50 MCG/DOSE IN AEPB: INHALATION_SPRAY | RESPIRATORY_TRACT | 2 refills | Status: DC

## 2016-11-15 ENCOUNTER — Emergency Department (HOSPITAL_COMMUNITY): Payer: 59

## 2016-11-15 ENCOUNTER — Encounter (HOSPITAL_COMMUNITY): Payer: Self-pay | Admitting: Emergency Medicine

## 2016-11-15 ENCOUNTER — Emergency Department (HOSPITAL_COMMUNITY)
Admission: EM | Admit: 2016-11-15 | Discharge: 2016-11-15 | Disposition: A | Discharge status: Home / Self Care | Payer: 59 | Attending: Emergency Medicine | Admitting: Emergency Medicine

## 2016-11-15 DIAGNOSIS — Z79899 Other long term (current) drug therapy: Secondary | ICD-10-CM | POA: Diagnosis not present

## 2016-11-15 DIAGNOSIS — J4521 Mild intermittent asthma with (acute) exacerbation: Secondary | ICD-10-CM | POA: Diagnosis not present

## 2016-11-15 DIAGNOSIS — R0602 Shortness of breath: Secondary | ICD-10-CM | POA: Diagnosis present

## 2016-11-15 MED ORDER — ALBUTEROL SULFATE (2.5 MG/3ML) 0.083% IN NEBU
5.0000 mg | INHALATION_SOLUTION | Freq: Once | RESPIRATORY_TRACT | Status: AC
  Administered 2016-11-15: 5 mg via RESPIRATORY_TRACT

## 2016-11-15 MED ORDER — ALBUTEROL SULFATE (2.5 MG/3ML) 0.083% IN NEBU
INHALATION_SOLUTION | RESPIRATORY_TRACT | Status: AC
  Filled 2016-11-15: qty 6

## 2016-11-15 MED ORDER — DEXAMETHASONE SODIUM PHOSPHATE 10 MG/ML IJ SOLN
10.0000 mg | Freq: Once | INTRAMUSCULAR | Status: AC
  Administered 2016-11-15: 10 mg via INTRAMUSCULAR
  Filled 2016-11-15: qty 1

## 2016-11-15 MED ORDER — ALBUTEROL SULFATE HFA 108 (90 BASE) MCG/ACT IN AERS
1.0000 | INHALATION_SPRAY | Freq: Once | RESPIRATORY_TRACT | Status: AC
  Administered 2016-11-15: 2 via RESPIRATORY_TRACT
  Filled 2016-11-15: qty 6.7

## 2016-11-15 NOTE — Discharge Instructions (Signed)
Please read instructions below. Attend your appointment on Friday with your primary care provider to follow up on your visit today. Discuss options for daily asthma treatment with your provider, and options based on affordability. Use the albuterol inhaler you were given today, 1-2 puffs every 6 hours as needed for difficulty breathing.  Return to the ER for worsening shortness of breath, chest pain, fever, or new or worsening symptoms.

## 2016-11-15 NOTE — ED Triage Notes (Signed)
Pt reports SOB, hx of asthma. States he has used inhaler at home with no relief. Denies CP
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
## 2016-12-08 ENCOUNTER — Ambulatory Visit: Payer: Self-pay | Admitting: Family Medicine

## 2017-01-13 LAB — GLUCOSE, POCT (MANUAL RESULT ENTRY): POC Glucose: 118 mg/dl — AB (ref 70–99)

## 2017-06-29 ENCOUNTER — Emergency Department (HOSPITAL_COMMUNITY): Payer: No Typology Code available for payment source

## 2017-06-29 ENCOUNTER — Emergency Department (HOSPITAL_COMMUNITY)
Admission: EM | Admit: 2017-06-29 | Discharge: 2017-06-29 | Disposition: A | Discharge status: Home / Self Care | Payer: No Typology Code available for payment source | Attending: Emergency Medicine | Admitting: Emergency Medicine

## 2017-06-29 ENCOUNTER — Encounter (HOSPITAL_COMMUNITY): Payer: Self-pay | Admitting: Emergency Medicine

## 2017-06-29 DIAGNOSIS — Y92410 Unspecified street and highway as the place of occurrence of the external cause: Secondary | ICD-10-CM | POA: Insufficient documentation

## 2017-06-29 DIAGNOSIS — Z23 Encounter for immunization: Secondary | ICD-10-CM | POA: Diagnosis not present

## 2017-06-29 DIAGNOSIS — S20212A Contusion of left front wall of thorax, initial encounter: Secondary | ICD-10-CM | POA: Insufficient documentation

## 2017-06-29 DIAGNOSIS — S40012A Contusion of left shoulder, initial encounter: Secondary | ICD-10-CM | POA: Insufficient documentation

## 2017-06-29 DIAGNOSIS — J45909 Unspecified asthma, uncomplicated: Secondary | ICD-10-CM | POA: Insufficient documentation

## 2017-06-29 DIAGNOSIS — S299XXA Unspecified injury of thorax, initial encounter: Secondary | ICD-10-CM | POA: Diagnosis present

## 2017-06-29 DIAGNOSIS — W19XXXA Unspecified fall, initial encounter: Secondary | ICD-10-CM

## 2017-06-29 DIAGNOSIS — Y998 Other external cause status: Secondary | ICD-10-CM | POA: Diagnosis not present

## 2017-06-29 DIAGNOSIS — Y939 Activity, unspecified: Secondary | ICD-10-CM | POA: Insufficient documentation

## 2017-06-29 MED ORDER — HYDROCODONE-ACETAMINOPHEN 5-325 MG PO TABS
1.0000 | ORAL_TABLET | Freq: Once | ORAL | Status: AC
  Administered 2017-06-29: 1 via ORAL
  Filled 2017-06-29: qty 1

## 2017-06-29 MED ORDER — ALBUTEROL SULFATE (2.5 MG/3ML) 0.083% IN NEBU
2.5000 mg | INHALATION_SOLUTION | Freq: Once | RESPIRATORY_TRACT | Status: AC
  Administered 2017-06-29: 2.5 mg via RESPIRATORY_TRACT
  Filled 2017-06-29: qty 3

## 2017-06-29 MED ORDER — TRAMADOL HCL 50 MG PO TABS: 50.0000 mg | ORAL_TABLET | Freq: Four times a day (QID) | ORAL | 0 refills | Status: DC | PRN

## 2017-06-29 MED ORDER — TETANUS-DIPHTH-ACELL PERTUSSIS 5-2.5-18.5 LF-MCG/0.5 IM SUSP
0.5000 mL | Freq: Once | INTRAMUSCULAR | Status: AC
  Administered 2017-06-29: 0.5 mL via INTRAMUSCULAR
  Filled 2017-06-29: qty 0.5
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
## 2017-06-29 NOTE — Discharge Instructions (Signed)
Follow-up with your family doctor in a week if not improving 

## 2017-06-29 NOTE — ED Notes (Signed)
ED Provider at bedside. 
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
## 2017-06-29 NOTE — ED Notes (Signed)
Patient transported to X-ray 

## 2017-06-29 NOTE — ED Notes (Signed)
Called Respiratory and made aware of neb treatment ordered.
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
## 2017-09-01 ENCOUNTER — Other Ambulatory Visit: Payer: Self-pay

## 2017-09-01 ENCOUNTER — Encounter (HOSPITAL_COMMUNITY): Payer: Self-pay | Admitting: Emergency Medicine

## 2017-09-01 ENCOUNTER — Emergency Department (HOSPITAL_COMMUNITY)
Admission: EM | Admit: 2017-09-01 | Discharge: 2017-09-01 | Disposition: A | Discharge status: Home / Self Care | Payer: Self-pay | Attending: Physician Assistant | Admitting: Physician Assistant

## 2017-09-01 ENCOUNTER — Emergency Department (HOSPITAL_COMMUNITY): Payer: Self-pay

## 2017-09-01 DIAGNOSIS — R0602 Shortness of breath: Secondary | ICD-10-CM | POA: Insufficient documentation

## 2017-09-01 DIAGNOSIS — J189 Pneumonia, unspecified organism: Secondary | ICD-10-CM

## 2017-09-01 DIAGNOSIS — J4531 Mild persistent asthma with (acute) exacerbation: Secondary | ICD-10-CM

## 2017-09-01 MED ORDER — ALBUTEROL SULFATE HFA 108 (90 BASE) MCG/ACT IN AERS
2.0000 | INHALATION_SPRAY | Freq: Once | RESPIRATORY_TRACT | Status: AC
  Administered 2017-09-01: 2 via RESPIRATORY_TRACT
  Filled 2017-09-01: qty 6.7

## 2017-09-01 MED ORDER — PREDNISONE 20 MG PO TABS
60.0000 mg | ORAL_TABLET | Freq: Once | ORAL | Status: AC
  Administered 2017-09-01: 60 mg via ORAL
  Filled 2017-09-01: qty 3

## 2017-09-01 MED ORDER — AZITHROMYCIN 250 MG PO TABS: 250.0000 mg | ORAL_TABLET | Freq: Every day | ORAL | 0 refills | Status: DC

## 2017-09-01 MED ORDER — ALBUTEROL SULFATE (2.5 MG/3ML) 0.083% IN NEBU
5.0000 mg | INHALATION_SOLUTION | Freq: Once | RESPIRATORY_TRACT | Status: AC
  Administered 2017-09-01: 5 mg via RESPIRATORY_TRACT

## 2017-09-01 MED ORDER — PREDNISONE 20 MG PO TABS: ORAL_TABLET | ORAL | 0 refills | Status: DC

## 2017-09-01 NOTE — ED Triage Notes (Addendum)
Pt arrived with sob- O2 sats 77% on room air.  History of asthma.  C/o flare-up that started 1 hour ago.  Lost his inhaler.  Speaking in short phrases.  Initially placed on 2L via [place].

## 2017-09-01 NOTE — ED Notes (Signed)
Speaking in complete sentences.  Breathing now unlabored.

## 2017-09-01 NOTE — ED Notes (Signed)
Pt. Initially came in with O2 sat of 67-77%, given 2L nasal cannula, came up to 95-100% after about 10 seconds.

## 2017-09-01 NOTE — Discharge Instructions (Signed)
Please take the steroids to help and information your lungs.  Please use the antibiotics to help with any kind of small infection that may be there.  Your x-ray has an atypical looking we recommend that you repeat your x-ray in 1 to 2 weeks.  Please follow-up with your primary care physician for this.
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
## 2017-09-30 ENCOUNTER — Emergency Department (HOSPITAL_COMMUNITY)
Admission: EM | Admit: 2017-09-30 | Discharge: 2017-09-30 | Disposition: A | Discharge status: Home / Self Care | Payer: Self-pay | Attending: Emergency Medicine | Admitting: Emergency Medicine

## 2017-09-30 ENCOUNTER — Encounter (HOSPITAL_COMMUNITY): Payer: Self-pay | Admitting: Emergency Medicine

## 2017-09-30 ENCOUNTER — Other Ambulatory Visit: Payer: Self-pay

## 2017-09-30 ENCOUNTER — Emergency Department (HOSPITAL_COMMUNITY): Payer: Self-pay

## 2017-09-30 DIAGNOSIS — J4521 Mild intermittent asthma with (acute) exacerbation: Secondary | ICD-10-CM

## 2017-09-30 DIAGNOSIS — Z79899 Other long term (current) drug therapy: Secondary | ICD-10-CM | POA: Insufficient documentation

## 2017-09-30 MED ORDER — ALBUTEROL SULFATE (2.5 MG/3ML) 0.083% IN NEBU
5.0000 mg | INHALATION_SOLUTION | Freq: Once | RESPIRATORY_TRACT | Status: AC
  Administered 2017-09-30: 5 mg via RESPIRATORY_TRACT
  Filled 2017-09-30: qty 6

## 2017-09-30 MED ORDER — PREDNISONE 20 MG PO TABS: 60.0000 mg | ORAL_TABLET | Freq: Every day | ORAL | 0 refills | Status: DC

## 2017-09-30 MED ORDER — PREDNISONE 20 MG PO TABS
60.0000 mg | ORAL_TABLET | Freq: Once | ORAL | Status: AC
  Administered 2017-09-30: 60 mg via ORAL
  Filled 2017-09-30: qty 3

## 2017-09-30 MED ORDER — ALBUTEROL SULFATE HFA 108 (90 BASE) MCG/ACT IN AERS
2.0000 | INHALATION_SPRAY | Freq: Once | RESPIRATORY_TRACT | Status: AC
  Administered 2017-09-30: 2 via RESPIRATORY_TRACT
  Filled 2017-09-30: qty 6.7

## 2017-09-30 NOTE — ED Provider Notes (Signed)
[facility]GENCY [facility] Provider Note   CSN: [id number] Arrival date & time: 09/30/17  1942     History   Chief Complaint Chief Complaint  Patient presents with  . Asthma    HPI Jason Cardenas is a 57 y.o. male with history of asthma who presents with shortness of breath and wheezing that began this morning.  Patient reports using his inhaler 5-7 times over the course of the weekend.  Reports it was working until he ran out of it.  He denies any cough or fever.  He reports he feels like his typical asthma exacerbation.  He denies any new leg pain or swelling.  He is not currently on any other medications for his asthma.  He used to be on Advair when he had insurance.    HPI  Past Medical History:  Diagnosis Date  . Asthma     There are no active problems to display for this patient.   Past Surgical History:  Procedure Laterality Date  . TONSILLECTOMY          Home Medications    Prior to Admission medications   Medication Sig Start Date End Date Taking? Authorizing Provider  albuterol (PROVENTIL HFA;VENTOLIN HFA) 108 (90 Base) MCG/ACT inhaler Inhale 2 puffs into the lungs every 6 (six) hours as needed for wheezing or shortness of breath. 11/03/16  Yes McClung, Marzella SchleinAngela M, PA-C  traMADol (ULTRAM) 50 MG tablet Take 1 tablet (50 mg total) by mouth every 6 (six) hours as needed. Patient taking differently: Take 50 mg by mouth every 6 (six) hours as needed for moderate pain.  06/29/17  Yes Bethann BerkshireZammit, Joseph, MD  Fluticasone-Salmeterol (ADVAIR) 250-50 MCG/DOSE AEPB 1 puff 2 times daily Patient not taking: Reported on 09/30/2017 11/03/16   Anders SimmondsMcClung, Angela M, PA-C  predniSONE (DELTASONE) 20 MG tablet Take 3 tablets (60 mg total) by mouth daily. 09/30/17   Emi HolesLaw, Eleftheria Taborn M, PA-C    Family History No family history on file.  Social History Social History   Tobacco Use  . Smoking status: Never Smoker  . Smokeless tobacco: Never Used  Substance Use Topics    . Alcohol use: No  . Drug use: No     Allergies   Patient has no known allergies.   Review of Systems Review of Systems  Constitutional: Negative for chills and fever.  HENT: Negative for facial swelling and sore throat.   Respiratory: Positive for shortness of breath and wheezing. Negative for cough and chest tightness.   Cardiovascular: Negative for chest pain.  Gastrointestinal: Negative for abdominal pain, nausea and vomiting.  Genitourinary: Negative for dysuria.  Musculoskeletal: Negative for back pain.  Skin: Negative for rash and wound.  Neurological: Negative for headaches.  Psychiatric/Behavioral: The patient is not nervous/anxious.      Physical Exam Updated Vital Signs BP (!) 154/96   Pulse 81   Temp 98.3 F (36.8 C) (Oral)   Resp 19   SpO2 93%   Physical Exam  Constitutional: He appears well-developed and well-nourished. No distress.  HENT:  Head: Normocephalic and atraumatic.  Mouth/Throat: Oropharynx is clear and moist. No oropharyngeal exudate.  Eyes: Pupils are equal, round, and reactive to light. Conjunctivae are normal. Right eye exhibits no discharge. Left eye exhibits no discharge. No scleral icterus.  Neck: Normal range of motion. Neck supple. No thyromegaly present.  Cardiovascular: Normal rate, regular rhythm, normal heart sounds and intact distal pulses. Exam reveals no gallop and no friction rub.  No murmur heard. Pulmonary/Chest: Effort normal. No stridor. No respiratory distress. He has decreased breath sounds. He has no wheezes. He has no rales.  Abdominal: Soft. Bowel sounds are normal. He exhibits no distension. There is no tenderness. There is no rebound and no guarding.  Musculoskeletal: He exhibits no edema.  Lymphadenopathy:    He has no cervical adenopathy.  Neurological: He is alert. Coordination normal.  Skin: Skin is warm and dry. No rash noted. He is not diaphoretic. No pallor.  Psychiatric: He has a normal mood and affect.   Nursing note and vitals reviewed.    ED Treatments / Results  Labs (all labs ordered are listed, but only abnormal results are displayed) Labs Reviewed - No data to display  EKG None  Radiology Dg Chest 2 View  Result Date: 09/30/2017 CLINICAL DATA:  Shortness of breath, asthma exacerbation. Pneumonia 1 month ago. EXAM: CHEST - 2 VIEW COMPARISON:  Radiographs 09/01/2017, left rib radiographs 06/29/2017 FINDINGS: Rounded densities projecting over the left mid lung appear similar to recent prior exam and likely represent callus formation from subacute fractures of the anterior fifth and sixth ribs given recent injury. The cardiomediastinal contours are normal. No focal airspace disease. Pulmonary vasculature is normal. No consolidation, pleural effusion, or pneumothorax. IMPRESSION: 1. Rounded densities in the left mid lung are unchanged from prior exam and felt to represent callus formation from subacute fractures of the anterior fifth and sixth ribs given prior injury. 2. No acute abnormality or focal airspace disease. Electronically Signed   By: Rubye Oaks M.D.   On: 09/30/2017 21:16    Procedures Procedures (including critical care time)  Medications Ordered in ED Medications  albuterol (PROVENTIL) (2.5 MG/3ML) 0.083% nebulizer solution 5 mg (5 mg Nebulization Given 09/30/17 1950)  predniSONE (DELTASONE) tablet 60 mg (60 mg Oral Given 09/30/17 2248)  albuterol (PROVENTIL HFA;VENTOLIN HFA) 108 (90 Base) MCG/ACT inhaler 2 puff (2 puffs Inhalation Given 09/30/17 2247)     Initial Impression / Assessment and Plan / ED Course  I have reviewed the triage vital signs and the nursing notes.  Pertinent labs & imaging results that were available during my care of the patient were reviewed by me and considered in my medical decision making (see chart for details).     Patient presenting with asthma exacerbation.  Symptoms are consistent with past exacerbations.  Patient ambulated in ED  with O2 saturations maintained >99%, no current signs of respiratory distress. Lung exam improved after albuterol nebulizer treatment. Prednisone given in the ED and pt will bd dc with 5 day burst. Pt states they are breathing at baseline. Pt has been instructed to continue using prescribed medications and to speak with PCP about today's exacerbation.  Chest x-ray be repeated because last visit, densities were seen, however which are unchanged on today's x-ray, but felt to represent callus formation from subacute fractures of the fifth and sixth ribs.  Patient understands and agrees with plan.  Patient vitals stable throughout ED course and discharged in satisfactory condition.   Final Clinical Impressions(s) / ED Diagnoses   Final diagnoses:  Mild intermittent asthma with exacerbation    ED Discharge Orders        Ordered    predniSONE (DELTASONE) 20 MG tablet  Daily     09/30/17 2328       Emi Holes, PA-C 10/01/17 0011    Margarita Grizzle, MD 10/01/17 [phone]

## 2017-09-30 NOTE — ED Triage Notes (Addendum)
Pt reports an asthma attack that started about 45 minutes ago. Pt ran out of home albuterol. Pt denies pain. Pt labored, accessory muscle usage and tachypneic in triage.

## 2017-09-30 NOTE — ED Notes (Signed)
Pt ambulated around NS. Ambulated well. Showing NAD. RR even and unlabored. SPO2 maintained @ 99% on RA & HR @ 92.

## 2017-09-30 NOTE — ED Notes (Signed)
Patient transported to X-ray 

## 2017-09-30 NOTE — Discharge Instructions (Signed)
Take prednisone for the next few days.  Please follow-up with your doctor this week for recheck.  Please return to emergency department if you develop any new or worsening symptoms.

## 2017-10-30 ENCOUNTER — Emergency Department (HOSPITAL_COMMUNITY)
Admission: EM | Admit: 2017-10-30 | Discharge: 2017-10-31 | Disposition: A | Discharge status: Home / Self Care | Payer: Self-pay | Attending: Emergency Medicine | Admitting: Emergency Medicine

## 2017-10-30 ENCOUNTER — Encounter (HOSPITAL_COMMUNITY): Payer: Self-pay

## 2017-10-30 DIAGNOSIS — J4521 Mild intermittent asthma with (acute) exacerbation: Secondary | ICD-10-CM

## 2017-10-30 DIAGNOSIS — Z79899 Other long term (current) drug therapy: Secondary | ICD-10-CM | POA: Insufficient documentation

## 2017-10-30 MED ORDER — ALBUTEROL SULFATE (2.5 MG/3ML) 0.083% IN NEBU
INHALATION_SOLUTION | RESPIRATORY_TRACT | Status: AC
  Filled 2017-10-30: qty 6

## 2017-10-30 MED ORDER — ALBUTEROL SULFATE (2.5 MG/3ML) 0.083% IN NEBU
5.0000 mg | INHALATION_SOLUTION | Freq: Once | RESPIRATORY_TRACT | Status: AC
  Administered 2017-10-30: 5 mg via RESPIRATORY_TRACT

## 2017-10-30 NOTE — ED Triage Notes (Signed)
Pt reports that asthma attack began around 830 when he got off work, inspiratory and expiratory wheezing. Pt labored, tachypneic, accessory muscle use. Ran out of rescue inhaler

## 2017-10-31 ENCOUNTER — Encounter (HOSPITAL_COMMUNITY): Payer: Self-pay | Admitting: Emergency Medicine

## 2017-10-31 ENCOUNTER — Emergency Department (HOSPITAL_COMMUNITY): Payer: Self-pay

## 2017-10-31 LAB — CBC WITH DIFFERENTIAL/PLATELET
Abs Immature Granulocytes: 0 10*3/uL (ref 0.0–0.1)
Basophils Absolute: 0.1 10*3/uL (ref 0.0–0.1)
Basophils Relative: 1 %
Eosinophils Absolute: 0.3 10*3/uL (ref 0.0–0.7)
Eosinophils Relative: 4 %
HCT: 49.2 % (ref 39.0–52.0)
Hemoglobin: 16 g/dL (ref 13.0–17.0)
Immature Granulocytes: 0 %
Lymphocytes Relative: 29 %
Lymphs Abs: 2.5 10*3/uL (ref 0.7–4.0)
MCH: 27.4 pg (ref 26.0–34.0)
MCHC: 32.5 g/dL (ref 30.0–36.0)
MCV: 84.2 fL (ref 78.0–100.0)
Monocytes Absolute: 0.5 10*3/uL (ref 0.1–1.0)
Monocytes Relative: 6 %
Neutro Abs: 5 10*3/uL (ref 1.7–7.7)
Neutrophils Relative %: 60 %
Platelets: 316 10*3/uL (ref 150–400)
RBC: 5.84 MIL/uL — ABNORMAL HIGH (ref 4.22–5.81)
RDW: 13.9 % (ref 11.5–15.5)
WBC: 8.4 10*3/uL (ref 4.0–10.5)

## 2017-10-31 LAB — I-STAT CHEM 8, ED
BUN: 13 mg/dL (ref 6–20)
Calcium, Ion: 1.13 mmol/L — ABNORMAL LOW (ref 1.15–1.40)
Chloride: 105 mmol/L (ref 98–111)
Creatinine, Ser: 1.1 mg/dL (ref 0.61–1.24)
Glucose, Bld: 101 mg/dL — ABNORMAL HIGH (ref 70–99)
HCT: 48 % (ref 39.0–52.0)
Hemoglobin: 16.3 g/dL (ref 13.0–17.0)
Potassium: 3.7 mmol/L (ref 3.5–5.1)
Sodium: 142 mmol/L (ref 135–145)
TCO2: 23 mmol/L (ref 22–32)

## 2017-10-31 LAB — I-STAT TROPONIN, ED: Troponin i, poc: 0 ng/mL (ref 0.00–0.08)

## 2017-10-31 MED ORDER — PREDNISONE 20 MG PO TABS: ORAL_TABLET | ORAL | 0 refills | Status: DC

## 2017-10-31 MED ORDER — LORATADINE 10 MG PO TABS
10.0000 mg | ORAL_TABLET | Freq: Once | ORAL | Status: AC
  Administered 2017-10-31: 10 mg via ORAL
  Filled 2017-10-31: qty 1

## 2017-10-31 MED ORDER — FLUTICASONE PROPIONATE HFA 44 MCG/ACT IN AERO: 2.0000 | INHALATION_SPRAY | Freq: Two times a day (BID) | RESPIRATORY_TRACT | 0 refills | Status: DC

## 2017-10-31 MED ORDER — PREDNISONE 20 MG PO TABS
60.0000 mg | ORAL_TABLET | Freq: Once | ORAL | Status: AC
  Administered 2017-10-31: 60 mg via ORAL
  Filled 2017-10-31: qty 3

## 2017-10-31 MED ORDER — ALBUTEROL SULFATE HFA 108 (90 BASE) MCG/ACT IN AERS: 1.0000 | INHALATION_SPRAY | Freq: Four times a day (QID) | RESPIRATORY_TRACT | 0 refills | Status: DC | PRN

## 2017-10-31 NOTE — ED Provider Notes (Signed)
[facility] EMERGENCY DEPARTMENT Provider Note   CSN: [id number] Arrival date & time: 10/30/17  2148     History   Chief Complaint Chief Complaint  Patient presents with  . Shortness of Breath    HPI Jason Cardenas is a 57 y.o. male.  The history is provided by the patient.  Wheezing   This is a recurrent problem. The current episode started 12 to 24 hours ago. The problem occurs constantly. The problem has not changed since onset.Pertinent negatives include no chest pain, no fever, no abdominal pain, no vomiting, no diarrhea, no dysuria, no coryza, no ear pain, no headaches, no rhinorrhea, no sore throat, no swollen glands, no neck pain, no cough, no hemoptysis and no sputum production. Precipitated by: heat  He has tried nothing for the symptoms. The treatment provided no relief.  Patient is out of his inhaler and off his steroid inhaler.    Past Medical History:  Diagnosis Date  . Asthma     There are no active problems to display for this patient.   Past Surgical History:  Procedure Laterality Date  . TONSILLECTOMY          Home Medications    Prior to Admission medications   Medication Sig Start Date End Date Taking? Authorizing Provider  albuterol (PROVENTIL HFA;VENTOLIN HFA) 108 (90 Base) MCG/ACT inhaler Inhale 2 puffs into the lungs every 6 (six) hours as needed for wheezing or shortness of breath. 11/03/16   Anders Simmonds, PA-C  albuterol (PROVENTIL HFA;VENTOLIN HFA) 108 (90 Base) MCG/ACT inhaler Inhale 1-2 puffs into the lungs every 6 (six) hours as needed for wheezing or shortness of breath. 10/31/17   Chayce Robbins, MD  fluticasone (FLOVENT HFA) 44 MCG/ACT inhaler Inhale 2 puffs into the lungs 2 (two) times daily. 10/31/17   Rmoni Keplinger, MD  Fluticasone-Salmeterol (ADVAIR) 250-50 MCG/DOSE AEPB 1 puff 2 times daily Patient not taking: Reported on 09/30/2017 11/03/16   Anders Simmonds, PA-C  predniSONE (DELTASONE) 20 MG tablet Take 3  tablets (60 mg total) by mouth daily. 09/30/17   Law, Waylan Boga, PA-C  predniSONE (DELTASONE) 20 MG tablet 3 tabs po day one, then 2 po daily x 4 days 10/31/17   Chauntelle Azpeitia, MD  traMADol (ULTRAM) 50 MG tablet Take 1 tablet (50 mg total) by mouth every 6 (six) hours as needed. Patient taking differently: Take 50 mg by mouth every 6 (six) hours as needed for moderate pain.  06/29/17   Bethann Berkshire, MD    Family History No family history on file.  Social History Social History   Tobacco Use  . Smoking status: Never Smoker  . Smokeless tobacco: Never Used  Substance Use Topics  . Alcohol use: No  . Drug use: No     Allergies   Patient has no known allergies.   Review of Systems Review of Systems  Constitutional: Negative for diaphoresis and fever.  HENT: Negative for ear pain, rhinorrhea and sore throat.   Respiratory: Positive for wheezing. Negative for cough, hemoptysis and sputum production.   Cardiovascular: Negative for chest pain, palpitations and leg swelling.  Gastrointestinal: Negative for abdominal pain, diarrhea and vomiting.  Genitourinary: Negative for dysuria.  Musculoskeletal: Negative for neck pain.  Neurological: Negative for headaches.  All other systems reviewed and are negative.    Physical Exam Updated Vital Signs BP (!) 142/104   Pulse 78   Temp 98.3 F (36.8 C) (Oral)   Resp 20  SpO2 96%   Physical Exam  Constitutional: He is oriented to person, place, and time. He appears well-developed and well-nourished. No distress.  HENT:  Head: Normocephalic and atraumatic.  Mouth/Throat: No oropharyngeal exudate.  Eyes: Pupils are equal, round, and reactive to light. Conjunctivae are normal.  Neck: Normal range of motion. Neck supple.  Cardiovascular: Normal rate, regular rhythm, normal heart sounds and intact distal pulses.  Pulmonary/Chest: He has wheezes.  Abdominal: Soft. Bowel sounds are normal. He exhibits no mass. There is no tenderness.  There is no rebound.  Musculoskeletal: Normal range of motion.  Neurological: He is alert and oriented to person, place, and time. He displays normal reflexes.  Skin: Skin is warm and dry. Capillary refill takes less than 2 seconds.  Psychiatric: He has a normal mood and affect.  Nursing note and vitals reviewed.    ED Treatments / Results  Labs (all labs ordered are listed, but only abnormal results are displayed) Results for orders placed or performed during the hospital encounter of 10/30/17  CBC with Differential/Platelet  Result Value Ref Range   WBC 8.4 4.0 - 10.5 K/uL   RBC 5.84 (H) 4.22 - 5.81 MIL/uL   Hemoglobin 16.0 13.0 - 17.0 g/dL   HCT 52.849.2 41.339.0 - 24.452.0 %   MCV 84.2 78.0 - 100.0 fL   MCH 27.4 26.0 - 34.0 pg   MCHC 32.5 30.0 - 36.0 g/dL   RDW 01.013.9 27.211.5 - 53.615.5 %   Platelets 316 150 - 400 K/uL   Neutrophils Relative % 60 %   Neutro Abs 5.0 1.7 - 7.7 K/uL   Lymphocytes Relative 29 %   Lymphs Abs 2.5 0.7 - 4.0 K/uL   Monocytes Relative 6 %   Monocytes Absolute 0.5 0.1 - 1.0 K/uL   Eosinophils Relative 4 %   Eosinophils Absolute 0.3 0.0 - 0.7 K/uL   Basophils Relative 1 %   Basophils Absolute 0.1 0.0 - 0.1 K/uL   Immature Granulocytes 0 %   Abs Immature Granulocytes 0.0 0.0 - 0.1 K/uL  I-stat chem 8, ed  Result Value Ref Range   Sodium 142 135 - 145 mmol/L   Potassium 3.7 3.5 - 5.1 mmol/L   Chloride 105 98 - 111 mmol/L   BUN 13 6 - 20 mg/dL   Creatinine, Ser 6.441.10 0.61 - 1.24 mg/dL   Glucose, Bld 034101 (H) 70 - 99 mg/dL   Calcium, Ion 7.421.13 (L) 1.15 - 1.40 mmol/L   TCO2 23 22 - 32 mmol/L   Hemoglobin 16.3 13.0 - 17.0 g/dL   HCT 59.548.0 63.839.0 - 75.652.0 %  I-stat troponin, ED  Result Value Ref Range   Troponin i, poc 0.00 0.00 - 0.08 ng/mL   Comment 3           Dg Chest 2 View  Result Date: 10/31/2017 CLINICAL DATA:  Shortness of breath. EXAM: CHEST - 2 VIEW COMPARISON:  Radiograph 09/30/2017 FINDINGS: The cardiomediastinal contours are normal. The lungs are clear.  Pulmonary vasculature is normal. No consolidation, pleural effusion, or pneumothorax. Remote left anterior/lateral rib fractures, unchanged. No acute osseous abnormalities are seen. IMPRESSION: No acute abnormalities. Electronically Signed   By: Rubye OaksMelanie  Ehinger M.D.   On: 10/31/2017 01:26    EKG EKG Interpretation  Date/Time:  Wednesday October 31 2017 00:40:21 EDT Ventricular Rate:  79 PR Interval:    QRS Duration: 86 QT Interval:  392 QTC Calculation: 450 R Axis:   76 Text Interpretation:  Sinus rhythm RSR' in V1  or V2, probably normal variant Confirmed by Nicanor Alcon, Sondra Blixt ([id number]) on 10/31/2017 12:58:01 AM   Radiology Dg Chest 2 View  Result Date: 10/31/2017 CLINICAL DATA:  Shortness of breath. EXAM: CHEST - 2 VIEW COMPARISON:  Radiograph 09/30/2017 FINDINGS: The cardiomediastinal contours are normal. The lungs are clear. Pulmonary vasculature is normal. No consolidation, pleural effusion, or pneumothorax. Remote left anterior/lateral rib fractures, unchanged. No acute osseous abnormalities are seen. IMPRESSION: No acute abnormalities. Electronically Signed   By: Rubye Oaks M.D.   On: 10/31/2017 01:26    Procedures Procedures (including critical care time)  Medications Ordered in ED Medications  albuterol (PROVENTIL) (2.5 MG/3ML) 0.083% nebulizer solution 5 mg (5 mg Nebulization Given 10/30/17 2158)  predniSONE (DELTASONE) tablet 60 mg (60 mg Oral Given 10/31/17 0354)  loratadine (CLARITIN) tablet 10 mg (10 mg Oral Given 10/31/17 0354)     Markedly improved post medication Will start steroids flovent and refill inhaler.    Final Clinical Impressions(s) / ED Diagnoses   Final diagnoses:  Mild intermittent asthma with exacerbation   Return for pain, numbness, changes in vision or speech, fevers >100.4 unrelieved by medication, shortness of breath, intractable vomiting, or diarrhea, abdominal pain, Inability to tolerate liquids or food, cough, altered mental status or any  concerns. No signs of systemic illness or infection. The patient is nontoxic-appearing on exam and vital signs are within normal limits. Will refer to urology for microscopy hematuria as patient is asymptomatic.  I have reviewed the triage vital signs and the nursing notes. Pertinent labs &imaging results that were available during my care of the patient were reviewed by me and considered in my medical decision making (see chart for details).  After history, exam, and medical workup I feel the patient has been appropriately medically screened and is safe for discharge home. Pertinent diagnoses were discussed with the patient. Patient was given return precautions. ED Discharge Orders        Ordered    predniSONE (DELTASONE) 20 MG tablet     10/31/17 0406    albuterol (PROVENTIL HFA;VENTOLIN HFA) 108 (90 Base) MCG/ACT inhaler  Every 6 hours PRN     10/31/17 0406    fluticasone (FLOVENT HFA) 44 MCG/ACT inhaler  2 times daily     10/31/17 0406       Breyanna Valera, MD 10/31/17 6045

## 2017-10-31 NOTE — ED Notes (Signed)
Patient transported to X-ray 

## 2017-11-05 ENCOUNTER — Ambulatory Visit: Payer: Self-pay | Admitting: Family Medicine

## 2017-12-04 ENCOUNTER — Encounter: Payer: Self-pay | Admitting: Nurse Practitioner

## 2017-12-04 ENCOUNTER — Ambulatory Visit: Payer: PRIVATE HEALTH INSURANCE | Attending: Nurse Practitioner | Admitting: Nurse Practitioner

## 2017-12-04 VITALS — BP 136/85 | HR 82 | Temp 98.5°F | Resp 14 | Ht 70.0 in | Wt 184.2 lb

## 2017-12-04 DIAGNOSIS — Z79899 Other long term (current) drug therapy: Secondary | ICD-10-CM | POA: Diagnosis not present

## 2017-12-04 DIAGNOSIS — J45909 Unspecified asthma, uncomplicated: Secondary | ICD-10-CM | POA: Diagnosis not present

## 2017-12-04 DIAGNOSIS — Z7689 Persons encountering health services in other specified circumstances: Secondary | ICD-10-CM | POA: Diagnosis not present

## 2017-12-04 DIAGNOSIS — J4520 Mild intermittent asthma, uncomplicated: Secondary | ICD-10-CM | POA: Diagnosis not present

## 2017-12-04 MED ORDER — FLUTICASONE-SALMETEROL 250-50 MCG/DOSE IN AEPB: INHALATION_SPRAY | RESPIRATORY_TRACT | 2 refills | Status: DC

## 2017-12-04 MED ORDER — ALBUTEROL SULFATE HFA 108 (90 BASE) MCG/ACT IN AERS: 1.0000 | INHALATION_SPRAY | Freq: Four times a day (QID) | RESPIRATORY_TRACT | 0 refills | Status: DC | PRN

## 2017-12-04 NOTE — Progress Notes (Signed)
Assessment & Plan:  Seldon was seen today for establish care.  Diagnoses and all orders for this visit:  Uncomplicated asthma, unspecified asthma severity, unspecified whether persistent -     albuterol (PROVENTIL HFA;VENTOLIN HFA) 108 (90 Base) MCG/ACT inhaler; Inhale 1-2 puffs into the lungs every 6 (six) hours as needed for wheezing or shortness of breath. -     Fluticasone-Salmeterol (ADVAIR) 250-50 MCG/DOSE AEPB; 1 puff 2 times daily Will need to see Pulmonology.  Discussed asthma triggers, red, green or\\and yellow flags  Patient has been counseled on age-appropriate routine health concerns for screening and prevention. These are reviewed and up-to-date. Referrals have been placed accordingly. Immunizations are up-to-date or declined.    Subjective:   Chief Complaint  Patient presents with  . Establish Care    Pt. is here to establish care for ashtma.    HPI Jason L Cardenas 57 y.o. male presents to office today to establish care. He has a history of asthma since childhood.   Asthma Chronic. Stable. He has not seen a primary care provider in many years. He has never seen a Pulmonologist for his asthma. He has had multiple ED visits for Asthma exacerbation with most recent being 7-9-for mild intermittent asthma.  He has been admitted for asthma exacerbation several years ago and required BIPAP. He reports losing his insurance over the past few years and not using his advair inhaler as prescribed due to financial issues. Asthma triggers: Heat and Humidity. Currently using his albuterol inhaler at least 3 times per week. He has been prescribed symbicort, flovent and most recently advair which he is currently out of at this time. Will prescribe advair today. He will need to be evaluated by Pulmonology to determine his severity of asthma by spirometry or PFT. Patient has been advised to apply for financial assistance and schedule to see our financial counselor.     Review of Systems    Constitutional: Negative for fever, malaise/fatigue and weight loss.  HENT: Negative.  Negative for nosebleeds.   Eyes: Negative.  Negative for blurred vision, double vision and photophobia.  Respiratory: Positive for shortness of breath. Negative for cough and wheezing.   Cardiovascular: Negative.  Negative for chest pain, palpitations and leg swelling.  Gastrointestinal: Negative.  Negative for heartburn, nausea and vomiting.  Musculoskeletal: Negative.  Negative for myalgias.  Neurological: Negative.  Negative for dizziness, focal weakness, seizures and headaches.  Psychiatric/Behavioral: Negative.  Negative for suicidal ideas.    Past Medical History:  Diagnosis Date  . Asthma     Past Surgical History:  Procedure Laterality Date  . TONSILLECTOMY      Family History  Problem Relation Age of Onset  . Diabetes Mother     Social History Reviewed with no changes to be made today.   Outpatient Medications Prior to Visit  Medication Sig Dispense Refill  . albuterol (PROVENTIL HFA;VENTOLIN HFA) 108 (90 Base) MCG/ACT inhaler Inhale 1-2 puffs into the lungs every 6 (six) hours as needed for wheezing or shortness of breath. 1 Inhaler 0  . albuterol (PROVENTIL HFA;VENTOLIN HFA) 108 (90 Base) MCG/ACT inhaler Inhale 2 puffs into the lungs every 6 (six) hours as needed for wheezing or shortness of breath. (Patient not taking: Reported on 12/04/2017) 6.7 g 0  . fluticasone (FLOVENT HFA) 44 MCG/ACT inhaler Inhale 2 puffs into the lungs 2 (two) times daily. (Patient not taking: Reported on 12/04/2017) 1 Inhaler 0  . Fluticasone-Salmeterol (ADVAIR) 250-50 MCG/DOSE AEPB 1 puff 2 times daily (  Patient not taking: Reported on 09/30/2017) 60 each 2  . predniSONE (DELTASONE) 20 MG tablet Take 3 tablets (60 mg total) by mouth daily. (Patient not taking: Reported on 12/04/2017) 12 tablet 0  . predniSONE (DELTASONE) 20 MG tablet 3 tabs po day one, then 2 po daily x 4 days (Patient not taking: Reported on  12/04/2017) 11 tablet 0  . traMADol (ULTRAM) 50 MG tablet Take 1 tablet (50 mg total) by mouth every 6 (six) hours as needed. (Patient not taking: Reported on 12/04/2017) 20 tablet 0   No facility-administered medications prior to visit.     No Known Allergies     Objective:    BP 136/85 (BP Location: Right Arm, Patient Position: Sitting, Cuff Size: Normal)   Pulse 82   Temp 98.5 F (36.9 C) (Oral)   Ht 5\' 10"  (1.778 m)   Wt 184 lb 3.2 oz (83.6 kg)   SpO2 95%   BMI 26.43 kg/m  Wt Readings from Last 3 Encounters:  12/04/17 184 lb 3.2 oz (83.6 kg)  06/29/17 184 lb (83.5 kg)  11/15/16 180 lb (81.6 kg)    Physical Exam  Constitutional: He is oriented to person, place, and time. He appears well-developed and well-nourished. He is cooperative.  HENT:  Head: Normocephalic and atraumatic.  Eyes: EOM are normal.  Neck: Normal range of motion.  Cardiovascular: Normal rate, regular rhythm and normal heart sounds. Exam reveals no gallop and no friction rub.  No murmur heard. Pulmonary/Chest: Effort normal and breath sounds normal. No accessory muscle usage or stridor. Tachypnea noted. No bradypnea. No respiratory distress. He has no decreased breath sounds. He has no wheezes. He has no rhonchi. He has no rales. He exhibits no tenderness.  Abdominal: Bowel sounds are normal.  Musculoskeletal: Normal range of motion. He exhibits no edema.  Neurological: He is alert and oriented to person, place, and time. Coordination normal.  Skin: Skin is warm and dry.  Psychiatric: He has a normal mood and affect. His behavior is normal. Judgment and thought content normal.  Nursing note and vitals reviewed.        Patient has been counseled extensively about nutrition and exercise as well as the importance of adherence with medications and regular follow-up. The patient was given clear instructions to go to ER or return to medical center if symptoms don't improve, worsen or new problems develop. The  patient verbalized understanding.   Follow-up: Return in about 4 weeks (around 01/01/2018).   Claiborne RiggZelda W Cobey Raineri, FNP-BC [facility] [place], [place] [phone]   12/04/2017, 10:34 PM

## 2017-12-18 ENCOUNTER — Other Ambulatory Visit: Payer: Self-pay

## 2017-12-18 ENCOUNTER — Emergency Department (HOSPITAL_COMMUNITY): Payer: PRIVATE HEALTH INSURANCE

## 2017-12-18 ENCOUNTER — Emergency Department (HOSPITAL_COMMUNITY)
Admission: EM | Admit: 2017-12-18 | Discharge: 2017-12-18 | Disposition: A | Discharge status: Home / Self Care | Payer: PRIVATE HEALTH INSURANCE | Attending: Emergency Medicine | Admitting: Emergency Medicine

## 2017-12-18 DIAGNOSIS — J45901 Unspecified asthma with (acute) exacerbation: Secondary | ICD-10-CM | POA: Diagnosis not present

## 2017-12-18 DIAGNOSIS — R0600 Dyspnea, unspecified: Secondary | ICD-10-CM | POA: Diagnosis present

## 2017-12-18 MED ORDER — ALBUTEROL SULFATE HFA 108 (90 BASE) MCG/ACT IN AERS: 1.0000 | INHALATION_SPRAY | Freq: Four times a day (QID) | RESPIRATORY_TRACT | 3 refills | Status: DC | PRN

## 2017-12-18 MED ORDER — ALBUTEROL SULFATE (2.5 MG/3ML) 0.083% IN NEBU
5.0000 mg | INHALATION_SOLUTION | Freq: Once | RESPIRATORY_TRACT | Status: AC
  Administered 2017-12-18: 5 mg via RESPIRATORY_TRACT
  Filled 2017-12-18: qty 6

## 2017-12-18 MED ORDER — ALBUTEROL SULFATE (2.5 MG/3ML) 0.083% IN NEBU
5.0000 mg | INHALATION_SOLUTION | Freq: Once | RESPIRATORY_TRACT | Status: DC
  Filled 2017-12-18: qty 6

## 2017-12-18 MED ORDER — ALBUTEROL SULFATE HFA 108 (90 BASE) MCG/ACT IN AERS
1.0000 | INHALATION_SPRAY | Freq: Four times a day (QID) | RESPIRATORY_TRACT | Status: DC
  Administered 2017-12-18: 2 via RESPIRATORY_TRACT
  Filled 2017-12-18: qty 6.7

## 2017-12-18 NOTE — Discharge Instructions (Addendum)
Please read attached information. If you experience any new or worsening signs or symptoms please return to the emergency room for evaluation. Please follow-up with your primary care provider or specialist as discussed. Please use medication prescribed only as directed and discontinue taking if you have any concerning signs or symptoms.   °

## 2017-12-18 NOTE — ED Provider Notes (Signed)
[facility]   CSN: [id number] Arrival date & time: 12/18/17  1253     History   Chief Complaint Chief Complaint  Patient presents with  . Asthma    HPI Jason Cardenas is a 57 y.o. male.  HPI   57 year old male with a significant past medical history of asthma presents today with complaints of asthma exacerbation. Patient notes over the last month with weather changing he's had worsening breathing issues, he notes he ran out of his albuterol inhaler at 4 AM this morning with significantly worsening of symptoms. Patient notes chest tightness and shortness of breath, denies any productive cough fever chest pain. He notes this is identical to previous episodes of asthma. Patient has a follow-up with primary care provider in the beginning of September for ongoing management of his asthma. Patient denies any infectious etiology, he is a nonsmoker.  Past Medical History:  Diagnosis Date  . Asthma     There are no active problems to display for this patient.   Past Surgical History:  Procedure Laterality Date  . TONSILLECTOMY          Home Medications    Prior to Admission medications   Medication Sig Start Date End Date Taking? Authorizing Provider  albuterol (PROVENTIL HFA;VENTOLIN HFA) 108 (90 Base) MCG/ACT inhaler Inhale 1-2 puffs into the lungs every 6 (six) hours as needed for wheezing or shortness of breath. 12/18/17   Karyl Sharrar, Tinnie GensJeffrey, PA-C  Fluticasone-Salmeterol (ADVAIR) 250-50 MCG/DOSE AEPB 1 puff 2 times daily 12/04/17   Claiborne RiggFleming, Zelda W, NP    Family History Family History  Problem Relation Age of Onset  . Diabetes Mother     Social History Social History   Tobacco Use  . Smoking status: Never Smoker  . Smokeless tobacco: Never Used  Substance Use Topics  . Alcohol use: Yes    Comment: socially   . Drug use: No     Allergies   Patient has no known allergies.   Review of Systems Review of  Systems  All other systems reviewed and are negative.    Physical Exam Updated Vital Signs BP (!) 158/97 (BP Location: Right Arm)   Pulse 99   Temp 97.7 F (36.5 C)   Resp (!) 24   SpO2 98%   Physical Exam  Constitutional: He is oriented to person, place, and time. He appears well-developed and well-nourished.  HENT:  Head: Normocephalic and atraumatic.  Eyes: Pupils are equal, round, and reactive to light. Conjunctivae are normal. Right eye exhibits no discharge. Left eye exhibits no discharge. No scleral icterus.  Neck: Normal range of motion. No JVD present. No tracheal deviation present.  Pulmonary/Chest: Effort normal and breath sounds normal. No stridor. No respiratory distress. He has no wheezes. He has no rales. He exhibits no tenderness.  Neurological: He is alert and oriented to person, place, and time. Coordination normal.  Psychiatric: He has a normal mood and affect. His behavior is normal. Judgment and thought content normal.  Nursing note and vitals reviewed.    ED Treatments / Results  Labs (all labs ordered are listed, but only abnormal results are displayed) Labs Reviewed - No data to display  EKG None  Radiology Dg Chest 2 View  Result Date: 12/18/2017 CLINICAL DATA:  Asthma attack.  Shortness of breath. EXAM: CHEST - 2 VIEW COMPARISON:  Chest x-ray dated October 31, 2017. FINDINGS: The heart size and mediastinal contours are within normal limits. Normal  pulmonary vascularity. No focal consolidation, pleural effusion, or pneumothorax. No acute osseous abnormality. IMPRESSION: No active cardiopulmonary disease. Electronically Signed   By: Obie Dredge M.D.   On: 12/18/2017 13:40    Procedures Procedures (including critical care time)  Medications Ordered in ED Medications  albuterol (PROVENTIL) (2.5 MG/3ML) 0.083% nebulizer solution 5 mg (5 mg Nebulization Not Given 12/18/17 1358)  albuterol (PROVENTIL HFA;VENTOLIN HFA) 108 (90 Base) MCG/ACT inhaler 1-2  puff (has no administration in time range)  albuterol (PROVENTIL) (2.5 MG/3ML) 0.083% nebulizer solution 5 mg (5 mg Nebulization Given 12/18/17 1358)     Initial Impression / Assessment and Plan / ED Course  I have reviewed the triage vital signs and the nursing notes.  Pertinent labs & imaging results that were available during my care of the patient were reviewed by me and considered in my medical decision making (see chart for details).     57 year old male presents today with acute asthma exacerbation. He received breathing treatment prior to my evaluation which significantly improved his symptoms. He has clear lung sounds no respiratory distress, no signs of infection. He'll be discharged with albuterol, outpatient follow-up and strict return precautions. He verbalized understanding and agreement to today's plan had no further questions or concerns.  Final Clinical Impressions(s) / ED Diagnoses   Final diagnoses:  Moderate asthma with exacerbation, unspecified whether persistent    ED Discharge Orders         Ordered    albuterol (PROVENTIL HFA;VENTOLIN HFA) 108 (90 Base) MCG/ACT inhaler  Every 6 hours PRN     12/18/17 1401           Eyvonne Mechanic, PA-C 12/18/17 1402    Pricilla Loveless, MD 12/18/17 2230

## 2017-12-18 NOTE — ED Triage Notes (Signed)
Pt states at 0600 he started having an asthma attack and he is out of albuterol inhaler. Pt able to speak in complete sentences.

## 2017-12-18 NOTE — ED Notes (Signed)
Pt verbalized understanding of discharge instructions and denies any further questions at this time.   

## 2018-01-07 ENCOUNTER — Ambulatory Visit: Payer: BLUE CROSS/BLUE SHIELD | Attending: Nurse Practitioner | Admitting: Nurse Practitioner

## 2018-01-07 ENCOUNTER — Encounter: Payer: Self-pay | Admitting: Nurse Practitioner

## 2018-01-07 VITALS — BP 133/87 | HR 64 | Temp 98.3°F | Resp 12 | Ht 70.0 in | Wt 175.4 lb

## 2018-01-07 DIAGNOSIS — S76912A Strain of unspecified muscles, fascia and tendons at thigh level, left thigh, initial encounter: Secondary | ICD-10-CM | POA: Insufficient documentation

## 2018-01-07 DIAGNOSIS — X58XXXA Exposure to other specified factors, initial encounter: Secondary | ICD-10-CM | POA: Insufficient documentation

## 2018-01-07 DIAGNOSIS — Z79899 Other long term (current) drug therapy: Secondary | ICD-10-CM | POA: Insufficient documentation

## 2018-01-07 DIAGNOSIS — T148XXA Other injury of unspecified body region, initial encounter: Secondary | ICD-10-CM

## 2018-01-07 DIAGNOSIS — R7303 Prediabetes: Secondary | ICD-10-CM

## 2018-01-07 DIAGNOSIS — Z833 Family history of diabetes mellitus: Secondary | ICD-10-CM | POA: Insufficient documentation

## 2018-01-07 DIAGNOSIS — Z09 Encounter for follow-up examination after completed treatment for conditions other than malignant neoplasm: Secondary | ICD-10-CM | POA: Insufficient documentation

## 2018-01-07 DIAGNOSIS — Z0000 Encounter for general adult medical examination without abnormal findings: Secondary | ICD-10-CM

## 2018-01-07 DIAGNOSIS — J4520 Mild intermittent asthma, uncomplicated: Secondary | ICD-10-CM

## 2018-01-07 DIAGNOSIS — Z1211 Encounter for screening for malignant neoplasm of colon: Secondary | ICD-10-CM

## 2018-01-07 LAB — POCT GLYCOSYLATED HEMOGLOBIN (HGB A1C): Hemoglobin A1C: 5.8 % — AB (ref 4.0–5.6)

## 2018-01-07 LAB — GLUCOSE, POCT (MANUAL RESULT ENTRY): POC Glucose: 93 mg/dl (ref 70–99)

## 2018-01-07 MED ORDER — TIZANIDINE HCL 4 MG PO CAPS: 4.0000 mg | ORAL_CAPSULE | Freq: Three times a day (TID) | ORAL | 0 refills | Status: AC

## 2018-01-07 MED ORDER — FLUTICASONE-SALMETEROL 250-50 MCG/DOSE IN AEPB: INHALATION_SPRAY | RESPIRATORY_TRACT | 2 refills | Status: DC

## 2018-01-07 NOTE — Progress Notes (Signed)
Assessment & Plan:  Jason Cardenas was seen today for follow-up.  Diagnoses and all orders for this visit:  Mild intermittent asthma without complication -     Ambulatory referral to Pulmonology -     Fluticasone-Salmeterol (ADVAIR) 250-50 MCG/DOSE AEPB; 1 puff 2 times daily  Prediabetes -     Glucose (CBG) -     HgB A1c Continue blood sugar control as discussed in office today, low carbohydrate diet, and regular physical exercise as tolerated, 150 minutes per week (30 min each day, 5 days per week, or 50 min 3 days per week).  Annual eye exams and foot exams are recommended.   Colon cancer screening -     Ambulatory referral to Gastroenterology  Encounter for annual physical exam -     CMP14+EGFR -     Lipid panel  Muscle strain -     tiZANidine (ZANAFLEX) 4 MG capsule; Take 1 capsule (4 mg total) by mouth 3 (three) times daily. May apply heat and ice to affected area as needed. Continue tylenol and ibuprofen as instructed.     Patient has been counseled on age-appropriate routine health concerns for screening and prevention. These are reviewed and up-to-date. Referrals have been placed accordingly. Immunizations are up-to-date or declined.    Subjective:   Chief Complaint  Patient presents with  . Follow-up    Pt. is here for asthma follow-up.    HPI Jason Cardenas 57 y.o. male presents to office today for asthma follow up.  Asthma He is using his Proventil 3-4 times during the week due to extreme heat at work which causes shortness of breath. Denies any complications or side effects using advair daily 250-50 mcg 1 puff BID. He has had multiple ED visits in the past for persistent asthma. He has never been evaluated by a pulmonologist nor has he ever had a PFT or spirometry.    Prediabetes Lab Results  Component Value Date   HGBA1C 5.8 (A) 01/07/2018  Chronic. Stable and well controlled with diet and exercise. He denies any hypo or hyperglycemic symptoms. Weight is down  11 lbs.    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  Feels like he pulled a muscle in his left thigh a few weeks ago. The pain is getting better but has not fully resolved. He is taking NSAIDs without relief of pain. Will try short term muscle relaxant. If no relief may need ultrasound. He denies any testicular pain and there is no soft tissue bruising to area.  Pain is worse at night.    Review of Systems  Constitutional: Negative for fever, malaise/fatigue and weight loss.  HENT: Negative.  Negative for nosebleeds.   Eyes: Negative.  Negative for blurred vision, double vision and photophobia.  Respiratory: Positive for shortness of breath. Negative for cough, hemoptysis and wheezing.   Cardiovascular: Negative.  Negative for chest pain, palpitations and leg swelling.  Gastrointestinal: Negative.  Negative for heartburn, nausea and vomiting.  Musculoskeletal: Positive for myalgias.  Neurological: Negative.  Negative for dizziness, focal weakness, seizures and headaches.  Psychiatric/Behavioral: Negative.  Negative for suicidal ideas.    Past Medical History:  Diagnosis Date  . Asthma   . Prediabetes     Past Surgical History:  Procedure Laterality Date  . TONSILLECTOMY      Family History  Problem Relation Age of Onset  . Diabetes Mother     Social History Reviewed with no changes  to be made today.   Outpatient Medications Prior to Visit  Medication Sig Dispense Refill  . albuterol (PROVENTIL HFA;VENTOLIN HFA) 108 (90 Base) MCG/ACT inhaler Inhale 1-2 puffs into the lungs every 6 (six) hours as needed for wheezing or shortness of breath. 1 Inhaler 3  . Fluticasone-Salmeterol (ADVAIR) 250-50 MCG/DOSE AEPB 1 puff 2 times daily 60 each 2   No facility-administered medications prior to visit.     No Known Allergies     Objective:    BP 133/87 (BP Location: Left Arm, Patient Position: Sitting, Cuff Size: Normal)   Pulse 64   Temp  98.3 F (36.8 C) (Oral)   Ht _0  (1.778 m)   Wt 175 lb 6.4 oz (79.6 kg)   SpO2 96%   BMI 25.17 kg/m  Wt Readings from Last 3 Encounters:  01/07/18 175 lb 6.4 oz (79.6 kg)  12/04/17 184 lb 3.2 oz (83.6 kg)  06/29/17 184 lb (83.5 kg)    Physical Exam  Constitutional: He is oriented to person, place, and time. He appears well-developed and well-nourished. He is cooperative.  HENT:  Head: Normocephalic and atraumatic.  Eyes: EOM are normal.  Neck: Normal range of motion.  Cardiovascular: Normal rate, regular rhythm and normal heart sounds. Exam reveals no gallop and no friction rub.  No murmur heard. Pulmonary/Chest: Effort normal and breath sounds normal. No accessory muscle usage or stridor. No tachypnea. No respiratory distress. He has no decreased breath sounds. He has no wheezes. He has no rhonchi. He has no rales. He exhibits no tenderness.  Abdominal: Bowel sounds are normal.  Musculoskeletal: Normal range of motion. He exhibits no edema.       Left upper leg: He exhibits tenderness. He exhibits no swelling, no edema and no laceration.       Legs: Neurological: He is alert and oriented to person, place, and time. Coordination normal.  Skin: Skin is warm and dry.  Psychiatric: He has a normal mood and affect. His behavior is normal. Judgment and thought content normal.  Nursing note and vitals reviewed.      Patient has been counseled extensively about nutrition and exercise as well as the importance of adherence with medications and regular follow-up. The patient was given clear instructions to go to ER or return to medical center if symptoms don't improve, worsen or new problems develop. The patient verbalized understanding.   Follow-up: Return for Physical ONLY no labs.   Jason Pounds, FNP-BC [facility] [place], [place]   01/07/2018, 12:06 PM

## 2018-01-07 NOTE — Patient Instructions (Signed)
Muscle Strain A muscle strain is an injury that occurs when a muscle is stretched beyond its normal length. Usually a small number of muscle fibers are torn when this happens. Muscle strain is rated in degrees. First-degree strains have the least amount of muscle fiber tearing and pain. Second-degree and third-degree strains have increasingly more tearing and pain. Usually, recovery from muscle strain takes 1-2 weeks. Complete healing takes 5-6 weeks. What are the causes? Muscle strain happens when a sudden, violent force placed on a muscle stretches it too far. This may occur with lifting, sports, or a fall. What increases the risk? Muscle strain is especially common in athletes. What are the signs or symptoms? At the site of the muscle strain, there may be:  Pain.  Bruising.  Swelling.  Difficulty using the muscle due to pain or lack of normal function.  How is this diagnosed? Your health care provider will perform a physical exam and ask about your medical history. How is this treated? Often, the best treatment for a muscle strain is resting, icing, and applying cold compresses to the injured area. Follow these instructions at home:  Use the PRICE method of treatment to promote muscle healing during the first 2-3 days after your injury. The PRICE method involves: ? Protecting the muscle from being injured again. ? Restricting your activity and resting the injured body part. ? Icing your injury. To do this, put ice in a plastic bag. Place a towel between your skin and the bag. Then, apply the ice and leave it on from 15-20 minutes each hour. After the third day, switch to moist heat packs. ? Apply compression to the injured area with a splint or elastic bandage. Be careful not to wrap it too tightly. This may interfere with blood circulation or increase swelling. ? Elevate the injured body part above the level of your heart as often as you can.  Only take over-the-counter or  prescription medicines for pain, discomfort, or fever as directed by your health care provider.  Warming up prior to exercise helps to prevent future muscle strains. Contact a health care provider if:  You have increasing pain or swelling in the injured area.  You have numbness, tingling, or a significant loss of strength in the injured area. This information is not intended to replace advice given to you by your health care provider. Make sure you discuss any questions you have with your health care provider. Document Released: 04/10/2005 Document Revised: 09/16/2015 Document Reviewed: 11/07/2012 Elsevier Interactive Patient Education  2017 [employer]  

## 2018-01-08 LAB — CMP14+EGFR
ALT: 14 IU/L (ref 0–44)
AST: 11 IU/L (ref 0–40)
Albumin/Globulin Ratio: 1.8 (ref 1.2–2.2)
Albumin: 4.5 g/dL (ref 3.5–5.5)
Alkaline Phosphatase: 65 IU/L (ref 39–117)
BUN/Creatinine Ratio: 11 (ref 9–20)
BUN: 14 mg/dL (ref 6–24)
Bilirubin Total: 0.8 mg/dL (ref 0.0–1.2)
CO2: 25 mmol/L (ref 20–29)
Calcium: 9.9 mg/dL (ref 8.7–10.2)
Chloride: 103 mmol/L (ref 96–106)
Creatinine, Ser: 1.3 mg/dL — ABNORMAL HIGH (ref 0.76–1.27)
GFR calc Af Amer: 72 mL/min/{1.73_m2} (ref >59)
GFR calc non Af Amer: 62 mL/min/{1.73_m2} (ref >59)
Globulin, Total: 2.5 g/dL (ref 1.5–4.5)
Glucose: 91 mg/dL (ref 65–99)
Potassium: 4.4 mmol/L (ref 3.5–5.2)
Sodium: 142 mmol/L (ref 134–144)
Total Protein: 7 g/dL (ref 6.0–8.5)

## 2018-01-08 LAB — LIPID PANEL
Chol/HDL Ratio: 3.8 ratio (ref 0.0–5.0)
Cholesterol, Total: 156 mg/dL (ref 100–199)
HDL: 41 mg/dL (ref >39)
LDL Calculated: 96 mg/dL (ref 0–99)
Triglycerides: 97 mg/dL (ref 0–149)
VLDL Cholesterol Cal: 19 mg/dL (ref 5–40)

## 2018-01-10 ENCOUNTER — Telehealth: Payer: Self-pay

## 2018-01-10 NOTE — Telephone Encounter (Signed)
CMA spoke to patient to inform on results.  Patient verified DOB. Patient understood.  

## 2018-01-10 NOTE — Telephone Encounter (Signed)
-----   Message from Claiborne RiggZelda W Fleming, NP sent at 01/08/2018  9:40 PM EDT ----- All of your labs are essentially normal including your cholesterol levels. Will continue to monitor your kidney function. Make sure you are not overusing medications like ibuprofen, advil, aleve or motrin

## 2018-01-29 ENCOUNTER — Encounter: Payer: Self-pay | Admitting: Nurse Practitioner

## 2018-02-01 ENCOUNTER — Institutional Professional Consult (permissible substitution): Payer: Self-pay | Admitting: Internal Medicine

## 2018-02-11 ENCOUNTER — Encounter (HOSPITAL_COMMUNITY): Payer: Self-pay | Admitting: Emergency Medicine

## 2018-02-11 ENCOUNTER — Emergency Department (HOSPITAL_COMMUNITY)
Admission: EM | Admit: 2018-02-11 | Discharge: 2018-02-11 | Disposition: A | Discharge status: Home / Self Care | Payer: BLUE CROSS/BLUE SHIELD | Attending: Emergency Medicine | Admitting: Emergency Medicine

## 2018-02-11 DIAGNOSIS — J45901 Unspecified asthma with (acute) exacerbation: Secondary | ICD-10-CM | POA: Diagnosis not present

## 2018-02-11 DIAGNOSIS — J4521 Mild intermittent asthma with (acute) exacerbation: Secondary | ICD-10-CM

## 2018-02-11 DIAGNOSIS — J4520 Mild intermittent asthma, uncomplicated: Secondary | ICD-10-CM

## 2018-02-11 DIAGNOSIS — J45909 Unspecified asthma, uncomplicated: Secondary | ICD-10-CM | POA: Diagnosis present

## 2018-02-11 MED ORDER — ALBUTEROL SULFATE (2.5 MG/3ML) 0.083% IN NEBU
INHALATION_SOLUTION | RESPIRATORY_TRACT | Status: AC
  Filled 2018-02-11: qty 6

## 2018-02-11 MED ORDER — FLUTICASONE-SALMETEROL 250-50 MCG/DOSE IN AEPB: INHALATION_SPRAY | RESPIRATORY_TRACT | 2 refills | Status: DC

## 2018-02-11 MED ORDER — PREDNISONE 20 MG PO TABS
60.0000 mg | ORAL_TABLET | Freq: Once | ORAL | Status: AC
  Administered 2018-02-11: 60 mg via ORAL
  Filled 2018-02-11: qty 3

## 2018-02-11 MED ORDER — ALBUTEROL SULFATE (2.5 MG/3ML) 0.083% IN NEBU
5.0000 mg | INHALATION_SOLUTION | Freq: Once | RESPIRATORY_TRACT | Status: AC
  Administered 2018-02-11: 5 mg via RESPIRATORY_TRACT

## 2018-02-11 MED ORDER — IPRATROPIUM-ALBUTEROL 0.5-2.5 (3) MG/3ML IN SOLN
3.0000 mL | Freq: Once | RESPIRATORY_TRACT | Status: AC
  Administered 2018-02-11: 3 mL via RESPIRATORY_TRACT
  Filled 2018-02-11: qty 3

## 2018-02-11 MED ORDER — ALBUTEROL SULFATE HFA 108 (90 BASE) MCG/ACT IN AERS
1.0000 | INHALATION_SPRAY | RESPIRATORY_TRACT | Status: DC | PRN
  Administered 2018-02-11: 2 via RESPIRATORY_TRACT
  Filled 2018-02-11: qty 6.7

## 2018-02-11 NOTE — ED Provider Notes (Signed)
Patient placed in Quick Look pathway, seen and evaluated   Chief Complaint: asthma  HPI: Jason Cardenas is a 57 y.o. male with hx of asthma who present to the ED with shortness of breath and what he reports as typical asthma attack. Patient ran out of his inhaler and does not see his PCP for a few weeks.   ROS: Resp: shortness of breath, wheezing  Physical Exam:  Blood pressure (!) 176/103, pulse (!) 105, temperature 98.8 F (37.1 C), temperature source Oral, resp. rate (!) 28, SpO2 97 %.    Gen: No distress  Neuro: Awake and Alert  Skin: Warm and dry  Lungs: decreased BS bilateral, no distress  Neb treatment in process    Initiation of care has begun. The patient has been counseled on the process, plan, and necessity for staying for the completion/evaluation, and the remainder of the medical screening examination    Janne Napoleon, NP 02/11/18 1336    Tilden Fossa, MD 02/12/18 1558

## 2018-02-11 NOTE — ED Provider Notes (Signed)
[facility] EMERGENCY DEPARTMENT Provider Note   CSN: [id number] Arrival date & time: 02/11/18  1315     History   Chief Complaint Chief Complaint  Patient presents with  . Asthma    HPI Jason Cardenas is a 57 y.o. male.  HPI   57 year old male presents today with asthma exacerbation.  Patient notes this feels identical to previous.  He notes he was getting ready to go to work when he developed wheezing and tightness in his chest.  Denies any preceding fever, productive cough or chest pain.  Patient notes he did not have an inhaler at home.  He received breathing treatment prior to my evaluation which he reports significantly improved his symptoms.  He denies any comp gating features here today.  He notes he has a follow-up appointment in 2 weeks with his primary care provider.  Past Medical History:  Diagnosis Date  . Asthma   . Prediabetes     There are no active problems to display for this patient.   Past Surgical History:  Procedure Laterality Date  . TONSILLECTOMY          Home Medications    Prior to Admission medications   Medication Sig Start Date End Date Taking? Authorizing Provider  albuterol (PROVENTIL HFA;VENTOLIN HFA) 108 (90 Base) MCG/ACT inhaler Inhale 1-2 puffs into the lungs every 6 (six) hours as needed for wheezing or shortness of breath. 12/18/17   Marquasha Brutus, Tinnie Gens, PA-C  Fluticasone-Salmeterol (ADVAIR) 250-50 MCG/DOSE AEPB 1 puff 2 times daily 02/11/18   Eyvonne Mechanic, PA-C    Family History Family History  Problem Relation Age of Onset  . Diabetes Mother     Social History Social History   Tobacco Use  . Smoking status: Never Smoker  . Smokeless tobacco: Never Used  Substance Use Topics  . Alcohol use: Yes    Comment: socially   . Drug use: No     Allergies   Patient has no known allergies.   Review of Systems Review of Systems  All other systems reviewed and are negative.    Physical  Exam Updated Vital Signs BP (!) 140/91 (BP Location: Right Arm)   Pulse 89   Temp 98.2 F (36.8 C)   Resp 16   SpO2 99%   Physical Exam  Constitutional: He is oriented to person, place, and time. He appears well-developed and well-nourished.  HENT:  Head: Normocephalic and atraumatic.  Eyes: Pupils are equal, round, and reactive to light. Conjunctivae are normal. Right eye exhibits no discharge. Left eye exhibits no discharge. No scleral icterus.  Neck: Normal range of motion. No JVD present. No tracheal deviation present.  Pulmonary/Chest: Effort normal and breath sounds normal. No stridor. No respiratory distress. He has no wheezes. He has no rales. He exhibits no tenderness.  Neurological: He is alert and oriented to person, place, and time. Coordination normal.  Psychiatric: He has a normal mood and affect. His behavior is normal. Judgment and thought content normal.  Nursing note and vitals reviewed.    ED Treatments / Results  Labs (all labs ordered are listed, but only abnormal results are displayed) Labs Reviewed - No data to display  EKG None  Radiology No results found.  Procedures Procedures (including critical care time)  Medications Ordered in ED Medications  albuterol (PROVENTIL) (2.5 MG/3ML) 0.083% nebulizer solution (has no administration in time range)  albuterol (PROVENTIL HFA;VENTOLIN HFA) 108 (90 Base) MCG/ACT inhaler 1-2 puff (2 puffs Inhalation Given  02/11/18 1925)  albuterol (PROVENTIL) (2.5 MG/3ML) 0.083% nebulizer solution 5 mg (5 mg Nebulization Given 02/11/18 1325)  ipratropium-albuterol (DUONEB) 0.5-2.5 (3) MG/3ML nebulizer solution 3 mL (3 mLs Nebulization Given 02/11/18 1344)  predniSONE (DELTASONE) tablet 60 mg (60 mg Oral Given 02/11/18 1344)     Initial Impression / Assessment and Plan / ED Course  I have reviewed the triage vital signs and the nursing notes.  Pertinent labs & imaging results that were available during my care of the  patient were reviewed by me and considered in my medical decision making (see chart for details).     Patient presents with asthma exacerbation.  Prior to my evaluation history of treatment which completely resolved the symptoms.  No respiratory distress, no signs of infection.  Discharged home with butyryl, Advair and outpatient follow-up with strict return precautions.  He verbalized understanding and agreement to today's plan.  Final Clinical Impressions(s) / ED Diagnoses   Final diagnoses:  Exacerbation of intermittent asthma, unspecified asthma severity    ED Discharge Orders         Ordered    Fluticasone-Salmeterol (ADVAIR) 250-50 MCG/DOSE AEPB     02/11/18 1928           Eyvonne Mechanic, PA-C 02/11/18 Claudette Laws, MD 02/11/18 2306

## 2018-02-11 NOTE — Discharge Instructions (Addendum)
Please read attached information. If you experience any new or worsening signs or symptoms please return to the emergency room for evaluation. Please follow-up with your primary care provider or specialist as discussed. Please use medication prescribed only as directed and discontinue taking if you have any concerning signs or symptoms.   °

## 2018-02-11 NOTE — ED Triage Notes (Signed)
Pt to ER for asthma, reports onset this morning around 8-9 am, is out of his rescue inhaler, states sees his PCP next month for a refill. Pt inspiratory and expiratory wheezing, respirations labored at this time but able to speak to me and is a.o x4. Neb started.

## 2018-02-20 ENCOUNTER — Ambulatory Visit: Payer: BLUE CROSS/BLUE SHIELD | Attending: Nurse Practitioner | Admitting: Nurse Practitioner

## 2018-02-20 ENCOUNTER — Encounter: Payer: Self-pay | Admitting: Nurse Practitioner

## 2018-02-20 VITALS — BP 132/80 | HR 98 | Temp 98.3°F | Ht 70.0 in | Wt 177.8 lb

## 2018-02-20 DIAGNOSIS — Z1211 Encounter for screening for malignant neoplasm of colon: Secondary | ICD-10-CM | POA: Diagnosis not present

## 2018-02-20 DIAGNOSIS — Z0001 Encounter for general adult medical examination with abnormal findings: Secondary | ICD-10-CM | POA: Insufficient documentation

## 2018-02-20 DIAGNOSIS — Z0000 Encounter for general adult medical examination without abnormal findings: Secondary | ICD-10-CM | POA: Diagnosis not present

## 2018-02-20 DIAGNOSIS — L989 Disorder of the skin and subcutaneous tissue, unspecified: Secondary | ICD-10-CM

## 2018-02-20 DIAGNOSIS — R2241 Localized swelling, mass and lump, right lower limb: Secondary | ICD-10-CM

## 2018-02-20 DIAGNOSIS — Z79899 Other long term (current) drug therapy: Secondary | ICD-10-CM | POA: Insufficient documentation

## 2018-02-20 DIAGNOSIS — J45909 Unspecified asthma, uncomplicated: Secondary | ICD-10-CM | POA: Insufficient documentation

## 2018-02-20 DIAGNOSIS — Z833 Family history of diabetes mellitus: Secondary | ICD-10-CM | POA: Insufficient documentation

## 2018-02-20 DIAGNOSIS — R7303 Prediabetes: Secondary | ICD-10-CM

## 2018-02-20 DIAGNOSIS — J45998 Other asthma: Secondary | ICD-10-CM | POA: Diagnosis not present

## 2018-02-20 DIAGNOSIS — Z7951 Long term (current) use of inhaled steroids: Secondary | ICD-10-CM | POA: Insufficient documentation

## 2018-02-20 LAB — GLUCOSE, POCT (MANUAL RESULT ENTRY): POC Glucose: 95 mg/dl (ref 70–99)

## 2018-02-20 MED ORDER — FLUTICASONE-SALMETEROL 250-50 MCG/DOSE IN AEPB: INHALATION_SPRAY | RESPIRATORY_TRACT | 6 refills | Status: DC

## 2018-02-20 NOTE — Progress Notes (Signed)
Assessment & Plan:  Jason Cardenas was seen today for annual exam.  Diagnoses and all orders for this visit:  Encounter for annual physical exam  Prediabetes -     Glucose (CBG) Continue blood sugar control as discussed in office today, low carbohydrate diet, and regular physical exercise as tolerated, 150 minutes per week (30 min each day, 5 days per week, or 50 min 3 days per week).   Poorly controlled persistent asthma -     Ambulatory referral to Pulmonology -     Fluticasone-Salmeterol (ADVAIR) 250-50 MCG/DOSE AEPB; 1 puff 2 times daily  Colon cancer screening -     Fecal occult blood, imunochemical([facility])  Skin abnormalities -     Ambulatory referral to Podiatry  Lower leg mass, right -     Ambulatory referral to General Surgery    Patient has been counseled on age-appropriate routine health concerns for screening and prevention. These are reviewed and up-to-date. Referrals have been placed accordingly. Immunizations are up-to-date or declined.    Subjective:   Chief Complaint  Patient presents with  . Annual Exam    Pt. is here for a physical.   HPI Jason Cardenas 57 y.o. male presents to office today for routine annual physical.   Asthma He has a history of asthma recently seen in the ED on 02/11/2018 with acute exacerbation.  He was treated in ED with duo nebs and prednisone.  He was discharged home with albuterol inhaler for as needed use as well as advair inhaler 250-50 1 puff BID. I also had referred him to pulmonology on a previous office visit however he was a no show to that appointment. Today he tells me he has been having transportation issues as well as his cell phone has been disconnected. He ran out of the advair inhaler and will need a refill today. He currently denies chest pain, shortness of breath, wheezing or cough.    Review of Systems  Constitutional: Negative for fever, malaise/fatigue and weight loss.  HENT: Negative.  Negative for  nosebleeds.   Eyes: Negative.  Negative for blurred vision, double vision and photophobia.  Respiratory: Negative.  Negative for cough and shortness of breath.   Cardiovascular: Negative.  Negative for chest pain, palpitations and leg swelling.  Gastrointestinal: Negative.  Negative for heartburn, nausea and vomiting.  Genitourinary: Negative.   Musculoskeletal: Negative.  Negative for myalgias.  Skin: Negative.   Neurological: Negative.  Negative for dizziness, focal weakness, seizures and headaches.  Endo/Heme/Allergies: Negative.   Psychiatric/Behavioral: Negative.  Negative for suicidal ideas.    Past Medical History:  Diagnosis Date  . Asthma   . Prediabetes     Past Surgical History:  Procedure Laterality Date  . TONSILLECTOMY      Family History  Problem Relation Age of Onset  . Diabetes Mother     Social History Reviewed with no changes to be made today.   Outpatient Medications Prior to Visit  Medication Sig Dispense Refill  . albuterol (PROVENTIL HFA;VENTOLIN HFA) 108 (90 Base) MCG/ACT inhaler Inhale 1-2 puffs into the lungs every 6 (six) hours as needed for wheezing or shortness of breath. 1 Inhaler 3  . Fluticasone-Salmeterol (ADVAIR) 250-50 MCG/DOSE AEPB 1 puff 2 times daily 60 each 2   No facility-administered medications prior to visit.     No Known Allergies     Objective:    BP 132/80 (BP Location: Left Arm, Patient Position: Sitting, Cuff Size: Normal)   Pulse 98  Temp 98.3 F (36.8 C) (Oral)   Ht 5\' 10"  (1.778 m)   Wt 177 lb 12.8 oz (80.6 kg)   SpO2 95%   BMI 25.51 kg/m  Wt Readings from Last 3 Encounters:  02/20/18 177 lb 12.8 oz (80.6 kg)  01/07/18 175 lb 6.4 oz (79.6 kg)  12/04/17 184 lb 3.2 oz (83.6 kg)    Physical Exam  Constitutional: He is oriented to person, place, and time. He appears well-developed and well-nourished.  HENT:  Head: Normocephalic and atraumatic.  Right Ear: Hearing, tympanic membrane, external ear and ear  canal normal.  Left Ear: Hearing, tympanic membrane, external ear and ear canal normal.  Nose: Nose normal. No mucosal edema or rhinorrhea.  Mouth/Throat: Uvula is midline, oropharynx is clear and moist and mucous membranes are normal. Dentures: partial edentulous. Abnormal dentition. Dental caries present. Tonsils are 1+ on the right. Tonsils are 1+ on the left. No tonsillar exudate.  Eyes: Pupils are equal, round, and reactive to light. Conjunctivae, EOM and lids are normal. No scleral icterus.  Wears glasses  Neck: Normal range of motion. Neck supple. No tracheal deviation present. No thyromegaly present.  Cardiovascular: Normal rate, regular rhythm, normal heart sounds and intact distal pulses. Exam reveals no gallop and no friction rub.  No murmur heard. Pulses:      Dorsalis pedis pulses are 2+ on the right side, and 2+ on the left side.       Posterior tibial pulses are 2+ on the right side.  Pulmonary/Chest: Effort normal and breath sounds normal. No respiratory distress. He has no wheezes. He has no rales. He exhibits no mass and no tenderness. Right breast exhibits no inverted nipple, no mass, no nipple discharge, no skin change and no tenderness. Left breast exhibits no inverted nipple, no mass, no nipple discharge, no skin change and no tenderness.  Abdominal: Soft. Bowel sounds are normal. He exhibits no distension and no mass. There is no tenderness. There is no rebound and no guarding. Hernia confirmed negative in the right inguinal area and confirmed negative in the left inguinal area.  Genitourinary: Testes normal and penis normal. Right testis shows no mass, no swelling and no tenderness. Right testis is descended. Cremasteric reflex is not absent on the right side. Left testis shows no mass, no swelling and no tenderness. Left testis is descended. Cremasteric reflex is not absent on the left side. Circumcised.  Musculoskeletal: Normal range of motion. He exhibits no edema,  tenderness or deformity.       Legs:      Feet:  Feet:  Right Foot:  Protective Sensation: 10 sites tested.  Skin Integrity: Positive for skin breakdown, callus and dry skin. Negative for ulcer, blister or erythema.  Left Foot:  Protective Sensation: 10 sites tested. 10 sites sensed.  Skin Integrity: Positive for blister, skin breakdown, callus and dry skin. Negative for ulcer or erythema.  Lymphadenopathy:    He has no cervical adenopathy. No inguinal adenopathy noted on the right or left side.       Right: No inguinal adenopathy present.       Left: No inguinal adenopathy present.  Neurological: He is alert and oriented to person, place, and time. He has normal strength. He displays normal reflexes. No cranial nerve deficit or sensory deficit. He exhibits normal muscle tone. He displays a negative Romberg sign. Coordination and gait normal.  Reflex Scores:      Patellar reflexes are 1+ on the right side and 1+  on the left side. Skin: Skin is warm and dry. Capillary refill takes less than 2 seconds. No erythema.  Psychiatric: He has a normal mood and affect. His behavior is normal. Judgment and thought content normal.         Patient has been counseled extensively about nutrition and exercise as well as the importance of adherence with medications and regular follow-up. The patient was given clear instructions to go to ER or return to medical center if symptoms don't improve, worsen or new problems develop. The patient verbalized understanding.   Follow-up: Return for SEE BELOW.   Claiborne Rigg, FNP-BC [facility] [place], [place] [phone]   02/20/2018, 10:33 AM

## 2018-02-27 ENCOUNTER — Telehealth: Payer: Self-pay | Admitting: *Deleted

## 2018-02-27 ENCOUNTER — Ambulatory Visit: Payer: Self-pay | Admitting: Critical Care Medicine

## 2018-02-27 DIAGNOSIS — J309 Allergic rhinitis, unspecified: Secondary | ICD-10-CM | POA: Insufficient documentation

## 2018-02-27 NOTE — Telephone Encounter (Signed)
MA contacted patient to inquire about NO show. No answer and MA unable to leave a message due to continuously ringing with no answer or VM option. !!!Please reschedule patient!!!

## 2018-02-27 NOTE — Progress Notes (Deleted)
   Subjective:    Patient ID: Jason Cardenas, male    DOB: [birth year], 57 y.o.   MRN: [id number]  58 y.o.M with asthma.  The patient was in the emergency room on 02/11/2018 with an asthma flare treated with nebulizer treatments.  The patient was discharged with a prescription for an Advair 250 inhaler along with albuterol as needed    Asthma  His past medical history is significant for asthma.      Review of Systems     Objective:   Physical Exam There were no vitals filed for this visit.  Gen: Pleasant, well-nourished, in no distress,  normal affect  ENT: No lesions,  mouth clear,  oropharynx clear, no postnasal drip  Neck: No JVD, no TMG, no carotid bruits  Lungs: No use of accessory muscles, no dullness to percussion, clear without rales or rhonchi  Cardiovascular: RRR, heart sounds normal, no murmur or gallops, no peripheral edema  Abdomen: soft and NT, no HSM,  BS normal  Musculoskeletal: No deformities, no cyanosis or clubbing  Neuro: alert, non focal  Skin: Warm, no lesions or rashes  No results found.  Chest x-ray from 12/18/2017 showed no active disease      Assessment & Plan:

## 2018-03-01 MED FILL — ADVAIR 250/50 DISKUS: 250-50 | 30 days supply | Qty: 60 | Fill #0

## 2018-03-06 LAB — FECAL OCCULT BLOOD, IMMUNOCHEMICAL: Fecal Occult Bld: NEGATIVE

## 2018-03-08 ENCOUNTER — Telehealth: Payer: Self-pay

## 2018-03-08 NOTE — Telephone Encounter (Signed)
CMA attempt to reach patient to inform on results.  Call was unable to go through and no voicemail was set up.  A letter will be send out to reach patient.   If patient call back, please inform:  Stool negative for blood. Will repeat next year. At this time a colonoscopy is not required

## 2018-03-08 NOTE — Telephone Encounter (Signed)
-----   Message from Claiborne RiggZelda W Fleming, NP sent at 03/06/2018 11:19 PM EST ----- Stool negative for blood. Will repeat next year. At this time a colonoscopy is not required

## 2018-03-11 NOTE — Telephone Encounter (Signed)
A letter will be send out to reach patient  

## 2018-07-28 ENCOUNTER — Emergency Department (HOSPITAL_COMMUNITY): Payer: PRIVATE HEALTH INSURANCE

## 2018-07-28 ENCOUNTER — Emergency Department (HOSPITAL_COMMUNITY)
Admission: EM | Admit: 2018-07-28 | Discharge: 2018-07-28 | Disposition: A | Discharge status: Home / Self Care | Payer: PRIVATE HEALTH INSURANCE | Attending: Emergency Medicine | Admitting: Emergency Medicine

## 2018-07-28 ENCOUNTER — Other Ambulatory Visit: Payer: Self-pay

## 2018-07-28 ENCOUNTER — Encounter (HOSPITAL_COMMUNITY): Payer: Self-pay | Admitting: Emergency Medicine

## 2018-07-28 DIAGNOSIS — R062 Wheezing: Secondary | ICD-10-CM | POA: Insufficient documentation

## 2018-07-28 DIAGNOSIS — R0789 Other chest pain: Secondary | ICD-10-CM | POA: Insufficient documentation

## 2018-07-28 DIAGNOSIS — J4541 Moderate persistent asthma with (acute) exacerbation: Secondary | ICD-10-CM

## 2018-07-28 DIAGNOSIS — R0682 Tachypnea, not elsewhere classified: Secondary | ICD-10-CM | POA: Insufficient documentation

## 2018-07-28 DIAGNOSIS — R61 Generalized hyperhidrosis: Secondary | ICD-10-CM | POA: Insufficient documentation

## 2018-07-28 LAB — CBC WITH DIFFERENTIAL/PLATELET
Abs Immature Granulocytes: 0.02 10*3/uL (ref 0.00–0.07)
Basophils Absolute: 0.1 10*3/uL (ref 0.0–0.1)
Basophils Relative: 1 %
Eosinophils Absolute: 0.4 10*3/uL (ref 0.0–0.5)
Eosinophils Relative: 5 %
HCT: 52.1 % — ABNORMAL HIGH (ref 39.0–52.0)
Hemoglobin: 16.4 g/dL (ref 13.0–17.0)
Immature Granulocytes: 0 %
Lymphocytes Relative: 30 %
Lymphs Abs: 2.8 10*3/uL (ref 0.7–4.0)
MCH: 26.6 pg (ref 26.0–34.0)
MCHC: 31.5 g/dL (ref 30.0–36.0)
MCV: 84.6 fL (ref 80.0–100.0)
Monocytes Absolute: 0.7 10*3/uL (ref 0.1–1.0)
Monocytes Relative: 7 %
Neutro Abs: 5.3 10*3/uL (ref 1.7–7.7)
Neutrophils Relative %: 57 %
Platelets: 327 10*3/uL (ref 150–400)
RBC: 6.16 MIL/uL — ABNORMAL HIGH (ref 4.22–5.81)
RDW: 14.4 % (ref 11.5–15.5)
WBC: 9.3 10*3/uL (ref 4.0–10.5)
nRBC: 0 % (ref 0.0–0.2)

## 2018-07-28 LAB — BASIC METABOLIC PANEL
Anion gap: 9 (ref 5–15)
BUN: 19 mg/dL (ref 6–20)
CO2: 23 mmol/L (ref 22–32)
Calcium: 9 mg/dL (ref 8.9–10.3)
Chloride: 107 mmol/L (ref 98–111)
Creatinine, Ser: 1.56 mg/dL — ABNORMAL HIGH (ref 0.61–1.24)
GFR calc Af Amer: 57 mL/min — ABNORMAL LOW (ref >60)
GFR calc non Af Amer: 49 mL/min — ABNORMAL LOW (ref >60)
Glucose, Bld: 88 mg/dL (ref 70–99)
Potassium: 4.2 mmol/L (ref 3.5–5.1)
Sodium: 139 mmol/L (ref 135–145)

## 2018-07-28 LAB — BRAIN NATRIURETIC PEPTIDE: B Natriuretic Peptide: 20.8 pg/mL (ref 0.0–100.0)

## 2018-07-28 MED ORDER — PREDNISONE 20 MG PO TABS
60.0000 mg | ORAL_TABLET | Freq: Once | ORAL | Status: AC
  Administered 2018-07-28: 60 mg via ORAL
  Filled 2018-07-28: qty 3

## 2018-07-28 MED ORDER — ALBUTEROL SULFATE HFA 108 (90 BASE) MCG/ACT IN AERS
INHALATION_SPRAY | RESPIRATORY_TRACT | Status: AC
  Administered 2018-07-28: 19:00:00
  Filled 2018-07-28: qty 6.7

## 2018-07-28 MED ORDER — PREDNISONE 20 MG PO TABS: ORAL_TABLET | ORAL | 0 refills | Status: DC

## 2018-07-28 NOTE — ED Notes (Signed)
Pt ambulated with no change in SPO2. 98 percent O2 sustained with pulse of 90.

## 2018-07-28 NOTE — ED Triage Notes (Addendum)
Patient arrived from home with shortness of breath, he reports that he has asthma, stating "this has happened all my life I have asthma, I ran out of meds."  Denies fever, denies contact with anyone who has been sick  EDP at bedtime

## 2018-07-28 NOTE — ED Provider Notes (Signed)
[facility] EMERGENCY DEPARTMENT Provider Note   CSN: [id number] Arrival date & time: 07/28/18  1845    History   Chief Complaint No chief complaint on file.   HPI Jason Cardenas is a 58 y.o. male.     The history is provided by the patient and medical records. No language interpreter was used.  Asthma      58 year old male with significant history of asthma presenting via EMS from home for evaluation of shortness of breath.  Patient report he was sitting and watching TV approximately an hour ago when he became very short of breath and wheezing.  Report of tightness in his chest and broke out in sweat.  He felt his symptom most likely an asthma exacerbation.  Report he ran out of his inhaler yesterday.  He denies any associated fever or chills, no productive cough, no body aches, no myalgias or exertional chest pain.  He denies tobacco abuse.  He did report remote asthma complication requiring intubation and ICU stay more than 10 years ago.  Past Medical History:  Diagnosis Date  . Asthma   . Prediabetes     Patient Active Problem List   Diagnosis Date Noted  . Allergic rhinitis due to allergen 02/27/2018  . Severe persistent asthma 11/17/2011  . Asthmatic bronchitis with exacerbation 10/02/2011    Past Surgical History:  Procedure Laterality Date  . TONSILLECTOMY          Home Medications    Prior to Admission medications   Medication Sig Start Date End Date Taking? Authorizing Provider  albuterol (PROVENTIL HFA;VENTOLIN HFA) 108 (90 Base) MCG/ACT inhaler Inhale 1-2 puffs into the lungs every 6 (six) hours as needed for wheezing or shortness of breath. 12/18/17   Hedges, Tinnie Gens, PA-C  Fluticasone-Salmeterol (ADVAIR) 250-50 MCG/DOSE AEPB 1 puff 2 times daily 02/20/18   Claiborne Rigg, NP    Family History Family History  Problem Relation Age of Onset  . Diabetes Mother     Social History Social History   Tobacco Use  . Smoking  status: Never Smoker  . Smokeless tobacco: Never Used  Substance Use Topics  . Alcohol use: Yes    Comment: socially   . Drug use: No     Allergies   Patient has no known allergies.   Review of Systems Review of Systems  All other systems reviewed and are negative.    Physical Exam Updated Vital Signs Pulse (!) 101   SpO2 98%   Physical Exam Vitals signs and nursing note reviewed.  Constitutional:      General: He is in acute distress.     Appearance: He is well-developed. He is diaphoretic.     Comments: Patient appears uncomfortable, having difficulty breathing and is diaphoretic.  HENT:     Head: Atraumatic.     Mouth/Throat:     Mouth: Mucous membranes are moist.  Eyes:     Conjunctiva/sclera: Conjunctivae normal.  Neck:     Musculoskeletal: Neck supple. No neck rigidity.  Cardiovascular:     Rate and Rhythm: Tachycardia present.  Pulmonary:     Breath sounds: Wheezing present.     Comments: Chest tightness with prolonged expiratory phase and faint wheezes.  No rales or rhonchi Abdominal:     Palpations: Abdomen is soft.  Musculoskeletal:        General: No swelling.  Skin:    Findings: No rash.  Neurological:     Mental Status: He is alert.  ED Treatments / Results  Labs (all labs ordered are listed, but only abnormal results are displayed) Labs Reviewed  BASIC METABOLIC PANEL - Abnormal; Notable for the following components:      Result Value   Creatinine, Ser 1.56 (*)    GFR calc non Af Amer 49 (*)    GFR calc Af Amer 57 (*)    All other components within normal limits  CBC WITH DIFFERENTIAL/PLATELET - Abnormal; Notable for the following components:   RBC 6.16 (*)    HCT 52.1 (*)    All other components within normal limits  BRAIN NATRIURETIC PEPTIDE    EKG EKG Interpretation  Date/Time:  Sunday July 28 2018 18:59:08 EDT Ventricular Rate:  96 PR Interval:    QRS Duration: 84 QT Interval:  349 QTC Calculation: 441 R Axis:    90 Text Interpretation:  Sinus rhythm Borderline right axis deviation Partial missing lead(s): V2 increased rate from prior 7/19 Confirmed by Butler, Michael ([id number]) on 07/28/2018 8:22:39 PM    Date: 07/28/2018  Rate: 96  Rhythm: normal sinus rhythm  QRS Axis: normal  Intervals: normal  ST/T Wave abnormalities: normal  Conduction Disutrbances: none  Narrative Interpretation:   Old EKG Reviewed: No significant changes noted     Radiology Dg Chest Port 1 View  Result Date: 07/28/2018 CLINICAL DATA:  Shortness of breath EXAM: PORTABLE CHEST 1 VIEW COMPARISON:  12/18/2017 FINDINGS: The heart size and mediastinal contours are within normal limits. Both lungs are clear. The visualized skeletal structures are unremarkable. IMPRESSION: No active disease. Electronically Signed   By: Hetal  Patel   On: 07/28/2018 19:49    Procedures Procedures (including critical care time)  Medications Ordered in ED Medications  albuterol (PROVENTIL HFA;VENTOLIN HFA) 108 (90 Base) MCG/ACT inhaler (  Given 07/28/18 1857)  predniSONE (DELTASONE) tablet 60 mg (60 mg Oral Given 07/28/18 1905)     Initial Impression / Assessment and Plan / ED Course  I have reviewed the triage vital signs and the nursing notes.  Pertinent labs & imaging results that were available during my care of the patient were reviewed by me and considered in my medical decision making (see chart for details).        BP (!) 146/97 (BP Location: Right Arm)   Pulse 79   Temp 98.3 F (36.8 C) (Oral)   Resp 19   Ht 5\' 10" (1.778 m)   Wt 81.6 kg   SpO2 100%   BMI 25.83 kg/m    Final Clinical Impressions(s) / ED Diagnoses   Final diagnoses:  Moderate persistent asthma with exacerbation    ED Discharge Orders         Ordered    predniSONE (DELTASONE) 20 MG tablet     04 /05/20 2040         7:03 PM Patient with significant history of asthma requiring albuterol use every other day who went out of his albuterol yesterday and  now having trouble breathing.  He came in appears diaphoretic, tachypneic, and ill-appearing.  He has decreased breath sounds with faint expiratory wheezes.  He felt this is similar to prior asthma exacerbation.  Will give albuterol inhaler and prednisone.  He report no recent sick contact or having any other COVID-19 symptoms.  He has been self isolating.  8:39 PM Lung sounds improved on reassessment.  Patient felt much better.  He is no longer diaphoretic.  He ambulate while maintaining O2 sats at 98% on room air.  He feels  stable to discharge.  Will discharge home with a course of steroid as well.  Return precaution discussed.   Fayrene Helper, PA-C 07/28/18 2040    Terrilee Files, MD 07/29/18 1046

## 2018-09-28 ENCOUNTER — Emergency Department (HOSPITAL_COMMUNITY)
Admission: EM | Admit: 2018-09-28 | Discharge: 2018-09-28 | Disposition: A | Discharge status: Home / Self Care | Payer: Self-pay | Attending: Emergency Medicine | Admitting: Emergency Medicine

## 2018-09-28 ENCOUNTER — Other Ambulatory Visit: Payer: Self-pay

## 2018-09-28 ENCOUNTER — Encounter (HOSPITAL_COMMUNITY): Payer: Self-pay

## 2018-09-28 DIAGNOSIS — J4541 Moderate persistent asthma with (acute) exacerbation: Secondary | ICD-10-CM

## 2018-09-28 MED ORDER — ALBUTEROL SULFATE HFA 108 (90 BASE) MCG/ACT IN AERS
INHALATION_SPRAY | RESPIRATORY_TRACT | Status: AC
  Administered 2018-09-28: 11:00:00
  Filled 2018-09-28: qty 6.7

## 2018-09-28 MED ORDER — FLUTICASONE PROPIONATE HFA 44 MCG/ACT IN AERO
2.0000 | INHALATION_SPRAY | Freq: Two times a day (BID) | RESPIRATORY_TRACT | Status: DC
  Administered 2018-09-28: 2 via RESPIRATORY_TRACT
  Filled 2018-09-28: qty 10.6

## 2018-09-28 MED ORDER — ALBUTEROL SULFATE HFA 108 (90 BASE) MCG/ACT IN AERS: 2.0000 | INHALATION_SPRAY | RESPIRATORY_TRACT | 2 refills | Status: DC | PRN

## 2018-09-28 MED ORDER — DEXAMETHASONE SODIUM PHOSPHATE 10 MG/ML IJ SOLN
10.0000 mg | Freq: Once | INTRAMUSCULAR | Status: AC
  Administered 2018-09-28: 10 mg via INTRAVENOUS
  Filled 2018-09-28: qty 1

## 2018-09-28 NOTE — ED Notes (Signed)
Pt placed on 3L oxygen due to dyspnea and SHOB; O2 sat 98%

## 2018-09-28 NOTE — ED Notes (Signed)
Patient verbalizes understanding of discharge instructions. Opportunity for questioning and answers were provided. Armband removed by staff, pt discharged from ED.  

## 2018-09-28 NOTE — ED Notes (Signed)
ED Provider at bedside. 

## 2018-09-28 NOTE — ED Notes (Signed)
Pt respirations are normal rate, and unlabored. Pt comfortable, stable, and is no distress at this time.

## 2018-09-28 NOTE — ED Notes (Addendum)
Dr. Billy Fischer and RT notified; pt having asthma exacerbation.

## 2018-09-28 NOTE — ED Notes (Signed)
RT at bedside.

## 2018-09-28 NOTE — ED Triage Notes (Signed)
Pt was walking to the ED when [employer] picked him up; pt c/o [facility] and is diaphoretic. Pt has hx of asthma; denies CP. Pt states he did not use his rescue inhaler because "they were empty." PT a&o x4

## 2018-09-28 NOTE — Care Management (Signed)
Patient is a patient with the [facility]. Last seen 11/19.  Patient can follow up with the clinic for medical needs. CM met with patient to discuss follow up with clinic patient is agreeable, CM updated patient's demographics. CM will reach out to clinic's CM to follow up with scheduling an e-visit with patient. Updated Dr. Billy Fischer Patient was provided with inhalers prior to discharge from the ED. No further ED CM needs identified  Laurena Slimmer RN, BSN  [facility] ED Care Manager [phone]

## 2018-09-28 NOTE — ED Provider Notes (Signed)
[facility] EMERGENCY DEPARTMENT Provider Note   CSN: [id number] Arrival date & time: 09/28/18  1040    History   Chief Complaint Chief Complaint  Patient presents with  . Shortness of Breath    asthma attack    HPI Jason Cardenas is a 58 y.o. male.     HPI   Started to feel some shortness of breath this morning, like asthma No significant coughing  Tried to use inhaler but both were empty  Tried walking here, but looked short of breath and was brought in by [employer]  Has been trying to stay in, avoid covid19, last night it rained, and after humidity goes up triggers asthma  Dyspnea was 6-7/10 now, now is almost back to normal after using albuterol inhaler  No fever, no chest pain    Past Medical History:  Diagnosis Date  . Asthma   . Prediabetes     Patient Active Problem List   Diagnosis Date Noted  . Allergic rhinitis due to allergen 02/27/2018  . Severe persistent asthma 11/17/2011  . Asthmatic bronchitis with exacerbation 10/02/2011    Past Surgical History:  Procedure Laterality Date  . TONSILLECTOMY          Home Medications    Prior to Admission medications   Medication Sig Start Date End Date Taking? Authorizing Provider  albuterol (VENTOLIN HFA) 108 (90 Base) MCG/ACT inhaler Inhale 2 puffs into the lungs every 4 (four) hours as needed for wheezing or shortness of breath. 09/28/18   Alvira MondaySchlossman, Vanassa Penniman, MD  Fluticasone-Salmeterol (ADVAIR) 250-50 MCG/DOSE AEPB 1 puff 2 times daily Patient not taking: Reported on 09/28/2018 02/20/18   Claiborne RiggFleming, Zelda W, NP    Family History Family History  Problem Relation Age of Onset  . Diabetes Mother     Social History Social History   Tobacco Use  . Smoking status: Never Smoker  . Smokeless tobacco: Never Used  Substance Use Topics  . Alcohol use: Yes    Comment: socially   . Drug use: No     Allergies   Patient has no known allergies.   Review of Systems Review of Systems   Constitutional: Negative for fever.  HENT: Negative for congestion and sore throat.   Respiratory: Positive for chest tightness, shortness of breath and wheezing.   Cardiovascular: Negative for chest pain and leg swelling.  Gastrointestinal: Negative for nausea and vomiting.  Skin: Negative for rash.  Neurological: Negative for light-headedness.     Physical Exam Updated Vital Signs BP (!) 145/88   Pulse 84   Resp 18   Ht 5\' 10"  (1.778 m)   Wt 81.6 kg   SpO2 97%   BMI 25.83 kg/m   Physical Exam Vitals signs and nursing note reviewed.  Constitutional:      General: He is not in acute distress.    Appearance: He is well-developed. He is not diaphoretic.  HENT:     Head: Normocephalic and atraumatic.  Eyes:     Conjunctiva/sclera: Conjunctivae normal.  Neck:     Musculoskeletal: Normal range of motion.  Cardiovascular:     Rate and Rhythm: Normal rate and regular rhythm.     Heart sounds: Normal heart sounds. No murmur. No friction rub. No gallop.   Pulmonary:     Effort: Pulmonary effort is normal. No respiratory distress.     Breath sounds: Normal breath sounds. No wheezing or rales.  Abdominal:     General: There is no distension.  Palpations: Abdomen is soft.     Tenderness: There is no abdominal tenderness. There is no guarding.  Skin:    General: Skin is warm and dry.  Neurological:     Mental Status: He is alert and oriented to person, place, and time.      ED Treatments / Results  Labs (all labs ordered are listed, but only abnormal results are displayed) Labs Reviewed - No data to display  EKG EKG Interpretation  Date/Time:  Saturday September 28 2018 10:49:47 EDT Ventricular Rate:  108 PR Interval:    QRS Duration: 97 QT Interval:  326 QTC Calculation: 439 R Axis:   89 Text Interpretation:  Sinus tachycardia LAE, consider biatrial enlargement Artifact in lead(s) I II III aVR aVL aVF V3 V4 V5 and baseline wander in lead(s) II III aVL aVF V2 V3 V4  V6 No significant change since last tracing Confirmed by Gareth Morgan [phone]) on 09/28/2018 11:39:00 AM   Radiology No results found.  Procedures Procedures (including critical care time)  Medications Ordered in ED Medications  albuterol (VENTOLIN HFA) 108 (90 Base) MCG/ACT inhaler (  Given 09/28/18 1057)  dexamethasone (DECADRON) injection 10 mg (10 mg Intravenous Given 09/28/18 1135)     Initial Impression / Assessment and Plan / ED Course  I have reviewed the triage vital signs and the nursing notes.  Pertinent labs & imaging results that were available during my care of the patient were reviewed by me and considered in my medical decision making (see chart for details).        58yo male with history of asthma presents with concern for shortness of breath consistent with asthma exacerbation in setting of running out of his inhalers.  Exam and history consistent with asthma exacerbation.  No sign of CHF, no chest pain doubt ACS, bilateral breath sounds and improved after albuterol, doubt pneumothorax.  Asthma exacerbation mild, brought on by weather and pt without medication to take at home.  Given albuterol and formeterol inhaler.  Case management saw and assisted with outpt follow up. Patient discharged in stable condition with understanding of reasons to return.     Final Clinical Impressions(s) / ED Diagnoses   Final diagnoses:  Moderate persistent asthma with acute exacerbation    ED Discharge Orders         Ordered    albuterol (VENTOLIN HFA) 108 (90 Base) MCG/ACT inhaler  Every 4 hours PRN     09/28/18 1145           Gareth Morgan, MD 09/28/18 1941

## 2018-09-30 ENCOUNTER — Telehealth: Payer: Self-pay

## 2018-09-30 NOTE — Telephone Encounter (Signed)
Message received from [place] requesting a hospital follow up appointment for the patient at [facility]. Call placed to patient # [phone]  and message was left requesting a call back to this CM # [phone]. The patient also has the phone number for [facility] on his ED AVS  Update provided to [facility], RN CM

## 2018-10-15 ENCOUNTER — Other Ambulatory Visit: Payer: Self-pay

## 2018-10-15 ENCOUNTER — Encounter (HOSPITAL_COMMUNITY): Payer: Self-pay | Admitting: Emergency Medicine

## 2018-10-15 ENCOUNTER — Emergency Department (HOSPITAL_COMMUNITY)
Admission: EM | Admit: 2018-10-15 | Discharge: 2018-10-15 | Disposition: A | Discharge status: Home / Self Care | Payer: Self-pay | Attending: Emergency Medicine | Admitting: Emergency Medicine

## 2018-10-15 ENCOUNTER — Emergency Department (HOSPITAL_COMMUNITY): Payer: Self-pay

## 2018-10-15 DIAGNOSIS — J4521 Mild intermittent asthma with (acute) exacerbation: Secondary | ICD-10-CM

## 2018-10-15 MED ORDER — ALBUTEROL SULFATE (2.5 MG/3ML) 0.083% IN NEBU: 5.0000 mg | INHALATION_SOLUTION | Freq: Once | RESPIRATORY_TRACT | Status: DC

## 2018-10-15 MED ORDER — ALBUTEROL SULFATE HFA 108 (90 BASE) MCG/ACT IN AERS
8.0000 | INHALATION_SPRAY | Freq: Once | RESPIRATORY_TRACT | Status: AC
  Administered 2018-10-15: 8 via RESPIRATORY_TRACT
  Filled 2018-10-15: qty 6.7

## 2018-10-15 MED ORDER — ALBUTEROL SULFATE HFA 108 (90 BASE) MCG/ACT IN AERS
8.0000 | INHALATION_SPRAY | Freq: Once | RESPIRATORY_TRACT | Status: AC
  Administered 2018-10-15: 8 via RESPIRATORY_TRACT

## 2018-10-15 MED ORDER — PREDNISONE 20 MG PO TABS: 60.0000 mg | ORAL_TABLET | Freq: Every day | ORAL | 0 refills | Status: DC

## 2018-10-15 NOTE — ED Notes (Signed)
Pt ambulated in hallway sp02 above 92 , walked with success

## 2018-10-15 NOTE — ED Triage Notes (Signed)
Pt comes to ed via ems, c/o sob with a asthma attack. Attack started at 1am, from home, used home his home inhaler w/o relief. Pt called ems, ems placed 20left forearm, 2grams mag sulfate, 125 solumedrol, 10 liters non breather on screen. V/s on arrival bp 160/98, pluse 110, r20, spo2 98 room air, temp 97.3.  Pt walked into room from ems stretcher.

## 2018-10-15 NOTE — Discharge Instructions (Signed)
You may use your albuterol inhaler 2 to 4 puffs every 2-4 hours as needed for shortness of breath and wheezing. °

## 2018-10-15 NOTE — ED Notes (Signed)
Bed: VD47 Expected date:  Expected time:  Means of arrival:  Comments: 58 yo M/ shortness of breath

## 2018-10-15 NOTE — ED Provider Notes (Signed)
TIME SEEN: 2:36 AM  CHIEF COMPLAINT: Asthma exacerbation  HPI: Patient is a 58 year old male with history of asthma who presents to the emergency department with wheezing, shortness of breath that started at 1 AM.  Use his albuterol inhaler at home without relief.  Called EMS and was given 125 mg of IV Solu-Medrol and 2 g of IV magnesium and states he is feeling much better.  No fevers, cough, chest pain.  States he has been staying at home and does not think that he has been exposed to coronavirus.  No history of PE or DVT.  Denies any chest discomfort.  States that he does not wear oxygen at home.  No history of tobacco use.  Has been admitted once before in 2016 for asthma exacerbation.  No history of intubation.  ROS: See HPI Constitutional: no fever  Eyes: no drainage  ENT: no runny nose   Cardiovascular:  no chest pain  Resp:  SOB  GI: no vomiting GU: no dysuria Integumentary: no rash  Allergy: no hives  Musculoskeletal: no leg swelling  Neurological: no slurred speech ROS otherwise negative  PAST MEDICAL HISTORY/PAST SURGICAL HISTORY:  Past Medical History:  Diagnosis Date  . Asthma   . Prediabetes     MEDICATIONS:  Prior to Admission medications   Medication Sig Start Date End Date Taking? Authorizing Provider  albuterol (VENTOLIN HFA) 108 (90 Base) MCG/ACT inhaler Inhale 2 puffs into the lungs every 4 (four) hours as needed for wheezing or shortness of breath. 09/28/18  Yes Alvira MondaySchlossman, Erin, MD  Fluticasone-Salmeterol (ADVAIR) 250-50 MCG/DOSE AEPB 1 puff 2 times daily Patient taking differently: Inhale 1 puff into the lungs 2 (two) times a day.  02/20/18  Yes Claiborne RiggFleming, Zelda W, NP    ALLERGIES:  No Known Allergies  SOCIAL HISTORY:  Social History   Tobacco Use  . Smoking status: Never Smoker  . Smokeless tobacco: Never Used  Substance Use Topics  . Alcohol use: Yes    Comment: socially     FAMILY HISTORY: Family History  Problem Relation Age of Onset  .  Diabetes Mother     EXAM: BP (!) 143/82 (BP Location: Left Arm)   Pulse (!) 101   Temp 98 F (36.7 C) (Oral)   Resp (!) 24   SpO2 93%  CONSTITUTIONAL: Alert and oriented and responds appropriately to questions. Well-appearing; well-nourished HEAD: Normocephalic EYES: Conjunctivae clear, pupils appear equal, EOMI ENT: normal nose; moist mucous membranes NECK: Supple, no meningismus, no nuchal rigidity, no LAD  CARD: RRR; S1 and S2 appreciated; no murmurs, no clicks, no rubs, no gallops RESP: Normal chest excursion without splinting or tachypnea; breath sounds clear and equal bilaterally; no wheezes, no rhonchi, no rales, no respiratory distress, speaking full sentences, oxygen saturation is in the low 90s on room air at rest ABD/GI: Normal bowel sounds; non-distended; soft, non-tender, no rebound, no guarding, no peritoneal signs, no hepatosplenomegaly BACK:  The back appears normal and is non-tender to palpation, there is no CVA tenderness EXT: Normal ROM in all joints; non-tender to palpation; no edema; normal capillary refill; no cyanosis, no calf tenderness or swelling    SKIN: Normal color for age and race; warm; no rash NEURO: Moves all extremities equally PSYCH: The patient's mood and manner are appropriate. Grooming and personal hygiene are appropriate.  MEDICAL DECISION MAKING: Patient here with asthma exacerbation.  Reports feeling better after IV meds provided by EMS.  Lungs are now clear but he still has oxygen saturations  in the low 90s on room air.  Will provide with albuterol inhaler and then ambulate with pulse oximetry.  His chest x-ray is clear.  His EKG shows no ischemic change.  Doubt ACS, PE, CHF, pneumonia.  ED PROGRESS: Patient ambulated with sats above 92% with walking with no shortness of breath.  I feel he is safe to be discharged home.  Will discharge with prednisone burst.  He has been provided with albuterol inhaler here in the ED.   At this time, I do not  feel there is any life-threatening condition present. I have reviewed and discussed all results (EKG, imaging, lab, urine as appropriate) and exam findings with patient/family. I have reviewed nursing notes and appropriate previous records.  I feel the patient is safe to be discharged home without further emergent workup and can continue workup as an outpatient as needed. Discussed usual and customary return precautions. Patient/family verbalize understanding and are comfortable with this plan.  Outpatient follow-up has been provided as needed. All questions have been answered.    EKG Interpretation  Date/Time:  Tuesday October 15 2018 03:00:07 EDT Ventricular Rate:  97 PR Interval:    QRS Duration: 92 QT Interval:  384 QTC Calculation: 488 R Axis:   84 Text Interpretation:  Sinus rhythm Abnormal R-wave progression, early transition Borderline prolonged QT interval No significant change since last tracing Confirmed by Harry Bark, Cyril Mourning [id number]) on 10/15/2018 3:03:38 AM           Kieffer Blatz, Delice Bison, DO 10/15/18 0451

## 2018-11-14 ENCOUNTER — Encounter (HOSPITAL_COMMUNITY): Payer: Self-pay | Admitting: Emergency Medicine

## 2018-11-14 ENCOUNTER — Emergency Department (HOSPITAL_COMMUNITY)
Admission: EM | Admit: 2018-11-14 | Discharge: 2018-11-14 | Disposition: A | Discharge status: Home / Self Care | Payer: Self-pay | Attending: Emergency Medicine | Admitting: Emergency Medicine

## 2018-11-14 ENCOUNTER — Emergency Department (HOSPITAL_COMMUNITY): Payer: Self-pay

## 2018-11-14 ENCOUNTER — Other Ambulatory Visit: Payer: Self-pay

## 2018-11-14 DIAGNOSIS — J45901 Unspecified asthma with (acute) exacerbation: Secondary | ICD-10-CM

## 2018-11-14 LAB — CBC
HCT: 49.1 % (ref 39.0–52.0)
Hemoglobin: 15.6 g/dL (ref 13.0–17.0)
MCH: 27.7 pg (ref 26.0–34.0)
MCHC: 31.8 g/dL (ref 30.0–36.0)
MCV: 87.1 fL (ref 80.0–100.0)
Platelets: 340 10*3/uL (ref 150–400)
RBC: 5.64 MIL/uL (ref 4.22–5.81)
RDW: 14 % (ref 11.5–15.5)
WBC: 7.6 10*3/uL (ref 4.0–10.5)
nRBC: 0 % (ref 0.0–0.2)

## 2018-11-14 LAB — BASIC METABOLIC PANEL
Anion gap: 8 (ref 5–15)
BUN: 12 mg/dL (ref 6–20)
CO2: 25 mmol/L (ref 22–32)
Calcium: 9.5 mg/dL (ref 8.9–10.3)
Chloride: 107 mmol/L (ref 98–111)
Creatinine, Ser: 1.37 mg/dL — ABNORMAL HIGH (ref 0.61–1.24)
GFR calc Af Amer: >60 mL/min (ref >60)
GFR calc non Af Amer: 58 mL/min — ABNORMAL LOW (ref >60)
Glucose, Bld: 103 mg/dL — ABNORMAL HIGH (ref 70–99)
Potassium: 4.5 mmol/L (ref 3.5–5.1)
Sodium: 140 mmol/L (ref 135–145)

## 2018-11-14 MED ORDER — METHYLPREDNISOLONE SODIUM SUCC 125 MG IJ SOLR
125.0000 mg | Freq: Once | INTRAMUSCULAR | Status: AC
  Administered 2018-11-14: 125 mg via INTRAMUSCULAR
  Filled 2018-11-14: qty 2

## 2018-11-14 MED ORDER — ALBUTEROL SULFATE HFA 108 (90 BASE) MCG/ACT IN AERS: 1.0000 | INHALATION_SPRAY | Freq: Four times a day (QID) | RESPIRATORY_TRACT | 1 refills | Status: DC | PRN

## 2018-11-14 MED ORDER — ALBUTEROL SULFATE HFA 108 (90 BASE) MCG/ACT IN AERS
8.0000 | INHALATION_SPRAY | Freq: Once | RESPIRATORY_TRACT | Status: AC
  Administered 2018-11-14: 8 via RESPIRATORY_TRACT
  Filled 2018-11-14: qty 6.7

## 2018-11-14 NOTE — ED Triage Notes (Signed)
Arrived via EMS patient has history asthma and increased shortness of breath over the past two weeks and now ran out of his inhaler.  EMS VS 140/90, HR 110, RR 20, 96% Room air and 98% 4L [place].

## 2018-11-14 NOTE — ED Notes (Signed)
ED Provider at bedside. 

## 2018-11-14 NOTE — ED Provider Notes (Signed)
[place] EMERGENCY DEPARTMENT Provider Note   CSN: [id number] Arrival date & time: 11/14/18  1302     History   Chief Complaint Chief Complaint  Patient presents with  . Asthma  . Shortness of Breath    HPI Jason Cardenas is a 58 y.o. male.     The history is provided by the patient. No language interpreter was used.  Asthma This is a new problem. The problem occurs constantly. The problem has been gradually worsening. Associated symptoms include shortness of breath. Nothing aggravates the symptoms. Nothing relieves the symptoms. He has tried nothing for the symptoms.  Shortness of Breath Pt reports he ran out of his inhaler and has had an asthma attack.  Pt complains of feeling short of breath  Past Medical History:  Diagnosis Date  . Asthma   . Prediabetes     Patient Active Problem List   Diagnosis Date Noted  . Allergic rhinitis due to allergen 02/27/2018  . Severe persistent asthma 11/17/2011  . Asthmatic bronchitis with exacerbation 10/02/2011    Past Surgical History:  Procedure Laterality Date  . TONSILLECTOMY          Home Medications    Prior to Admission medications   Medication Sig Start Date End Date Taking? Authorizing Provider  albuterol (VENTOLIN HFA) 108 (90 Base) MCG/ACT inhaler Inhale 1-2 puffs into the lungs every 6 (six) hours as needed for wheezing or shortness of breath. 11/14/18   Fransico Meadow, PA-C  predniSONE (DELTASONE) 20 MG tablet Take 3 tablets (60 mg total) by mouth daily. Patient not taking: Reported on 11/14/2018 10/15/18   Ward, Delice Bison, DO  Fluticasone-Salmeterol (ADVAIR) 250-50 MCG/DOSE AEPB 1 puff 2 times daily Patient not taking: Reported on 11/14/2018 02/20/18 11/14/18  Gildardo Pounds, NP    Family History Family History  Problem Relation Age of Onset  . Diabetes Mother     Social History Social History   Tobacco Use  . Smoking status: Never Smoker  . Smokeless tobacco: Never Used   Substance Use Topics  . Alcohol use: Yes    Comment: socially   . Drug use: No     Allergies   Patient has no known allergies.   Review of Systems Review of Systems  Respiratory: Positive for shortness of breath.   All other systems reviewed and are negative.    Physical Exam Updated Vital Signs BP (!) 155/90 (BP Location: Right Arm)   Pulse 92   Temp 98.8 F (37.1 C) (Oral)   Resp 16   Ht 5\' 10"  (1.778 m)   Wt 81 kg   SpO2 96%   BMI 25.62 kg/m   Physical Exam Vitals signs and nursing note reviewed.  Constitutional:      Appearance: He is well-developed.  HENT:     Head: Normocephalic and atraumatic.  Eyes:     Conjunctiva/sclera: Conjunctivae normal.  Neck:     Musculoskeletal: Neck supple.  Cardiovascular:     Rate and Rhythm: Normal rate and regular rhythm.     Heart sounds: No murmur.  Pulmonary:     Effort: Pulmonary effort is normal. No respiratory distress.     Breath sounds: Wheezing present.  Chest:     Chest wall: No mass.  Abdominal:     Palpations: Abdomen is soft.     Tenderness: There is no abdominal tenderness.  Musculoskeletal: Normal range of motion.  Skin:    General: Skin is warm and dry.  Neurological:     General: No focal deficit present.     Mental Status: He is alert.      ED Treatments / Results  Labs (all labs ordered are listed, but only abnormal results are displayed) Labs Reviewed  BASIC METABOLIC PANEL - Abnormal; Notable for the following components:      Result Value   Glucose, Bld 103 (*)    Creatinine, Ser 1.37 (*)    GFR calc non Af Amer 58 (*)    All other components within normal limits  CBC    EKG EKG Interpretation  Date/Time:  Thursday November 14 2018 13:10:17 EDT Ventricular Rate:  100 PR Interval:  152 QRS Duration: 78 QT Interval:  358 QTC Calculation: 461 R Axis:   81 Text Interpretation:  Normal sinus rhythm Normal ECG Confirmed by Raeford RazorKohut, Stephen [id number]) on 11/14/2018 2:27:07 PM    Radiology Dg Chest Port 1 View  Result Date: 11/14/2018 CLINICAL DATA:  Cough, asthma EXAM: PORTABLE CHEST 1 VIEW COMPARISON:  10/15/2018 FINDINGS: The heart size and mediastinal contours are within normal limits. Both lungs are clear. The visualized skeletal structures are unremarkable. IMPRESSION: No active disease. Electronically Signed   By: Duanne GuessNicholas  Plundo M.D.   On: 11/14/2018 15:54    Procedures Procedures (including critical care time)  Medications Ordered in ED Medications  albuterol (VENTOLIN HFA) 108 (90 Base) MCG/ACT inhaler 8 puff (8 puffs Inhalation Given 11/14/18 1455)  methylPREDNISolone sodium succinate (SOLU-MEDROL) 125 mg/2 mL injection 125 mg (125 mg Intramuscular Given 11/14/18 1455)     Initial Impression / Assessment and Plan / ED Course  I have reviewed the triage vital signs and the nursing notes.  Pertinent labs & imaging results that were available during my care of the patient were reviewed by me and considered in my medical decision making (see chart for details).        MDM: Patient given 8 puffs of albuterol HFA and Solu-Medrol 125 mg IM patient's chest x-ray returned and shows no acute disease patient is observed oxygen saturation remains in the 96-100 range after treatment patient reports he feels much better.  Patient is given a prescription for prednisone 50 mg a day for 5 days he is given a prescription for albuterol inhalers as well as an albuterol inhaler to take home today.  Patient is given a note to be out of work tonight he is advised to return to the emergency department if symptoms worsen or change An After Visit Summary was printed and given to the patient.   Final Clinical Impressions(s) / ED Diagnoses   Final diagnoses:  Moderate asthma with exacerbation, unspecified whether persistent    ED Discharge Orders         Ordered    albuterol (VENTOLIN HFA) 108 (90 Base) MCG/ACT inhaler  Every 6 hours PRN     11/14/18 1830            Osie CheeksSofia, Leslie K, PA-C 11/14/18 1858    Raeford RazorKohut, Stephen, MD 11/15/18 0710

## 2018-11-14 NOTE — ED Notes (Signed)
Pt discharged with all belongings. Discharge instructions reviewed with pt, and pt verbalized understanding. Opportunity for questions provided.  

## 2018-11-14 NOTE — Discharge Instructions (Signed)
Return if any problems.

## 2018-12-01 ENCOUNTER — Encounter (HOSPITAL_COMMUNITY): Payer: Self-pay

## 2018-12-01 ENCOUNTER — Observation Stay (HOSPITAL_COMMUNITY)
Admission: EM | Admit: 2018-12-01 | Discharge: 2018-12-02 | Disposition: A | Discharge status: Home / Self Care | Payer: Self-pay | Attending: Family Medicine | Admitting: Family Medicine

## 2018-12-01 ENCOUNTER — Emergency Department (HOSPITAL_COMMUNITY): Payer: Self-pay

## 2018-12-01 DIAGNOSIS — Z20828 Contact with and (suspected) exposure to other viral communicable diseases: Secondary | ICD-10-CM | POA: Insufficient documentation

## 2018-12-01 DIAGNOSIS — Z79899 Other long term (current) drug therapy: Secondary | ICD-10-CM | POA: Insufficient documentation

## 2018-12-01 DIAGNOSIS — R7303 Prediabetes: Secondary | ICD-10-CM | POA: Insufficient documentation

## 2018-12-01 DIAGNOSIS — J45909 Unspecified asthma, uncomplicated: Secondary | ICD-10-CM | POA: Diagnosis present

## 2018-12-01 DIAGNOSIS — Z0184 Encounter for antibody response examination: Secondary | ICD-10-CM | POA: Insufficient documentation

## 2018-12-01 DIAGNOSIS — Z833 Family history of diabetes mellitus: Secondary | ICD-10-CM | POA: Insufficient documentation

## 2018-12-01 DIAGNOSIS — J4551 Severe persistent asthma with (acute) exacerbation: Secondary | ICD-10-CM | POA: Insufficient documentation

## 2018-12-01 DIAGNOSIS — R0682 Tachypnea, not elsewhere classified: Secondary | ICD-10-CM | POA: Insufficient documentation

## 2018-12-01 DIAGNOSIS — R0902 Hypoxemia: Secondary | ICD-10-CM | POA: Insufficient documentation

## 2018-12-01 DIAGNOSIS — J4541 Moderate persistent asthma with (acute) exacerbation: Secondary | ICD-10-CM

## 2018-12-01 DIAGNOSIS — J309 Allergic rhinitis, unspecified: Secondary | ICD-10-CM | POA: Insufficient documentation

## 2018-12-01 LAB — CBC WITH DIFFERENTIAL/PLATELET
Abs Immature Granulocytes: 0.02 10*3/uL (ref 0.00–0.07)
Basophils Absolute: 0.1 10*3/uL (ref 0.0–0.1)
Basophils Relative: 1 %
Eosinophils Absolute: 0.7 10*3/uL — ABNORMAL HIGH (ref 0.0–0.5)
Eosinophils Relative: 8 %
HCT: 47.9 % (ref 39.0–52.0)
Hemoglobin: 15.7 g/dL (ref 13.0–17.0)
Immature Granulocytes: 0 %
Lymphocytes Relative: 34 %
Lymphs Abs: 2.7 10*3/uL (ref 0.7–4.0)
MCH: 27.7 pg (ref 26.0–34.0)
MCHC: 32.8 g/dL (ref 30.0–36.0)
MCV: 84.5 fL (ref 80.0–100.0)
Monocytes Absolute: 0.6 10*3/uL (ref 0.1–1.0)
Monocytes Relative: 7 %
Neutro Abs: 4 10*3/uL (ref 1.7–7.7)
Neutrophils Relative %: 50 %
Platelets: 318 10*3/uL (ref 150–400)
RBC: 5.67 MIL/uL (ref 4.22–5.81)
RDW: 13.6 % (ref 11.5–15.5)
WBC: 8 10*3/uL (ref 4.0–10.5)
nRBC: 0 % (ref 0.0–0.2)

## 2018-12-01 LAB — COMPREHENSIVE METABOLIC PANEL
ALT: 22 U/L (ref 0–44)
AST: 27 U/L (ref 15–41)
Albumin: 4 g/dL (ref 3.5–5.0)
Alkaline Phosphatase: 55 U/L (ref 38–126)
Anion gap: 11 (ref 5–15)
BUN: 18 mg/dL (ref 6–20)
CO2: 21 mmol/L — ABNORMAL LOW (ref 22–32)
Calcium: 9 mg/dL (ref 8.9–10.3)
Chloride: 106 mmol/L (ref 98–111)
Creatinine, Ser: 1.38 mg/dL — ABNORMAL HIGH (ref 0.61–1.24)
GFR calc Af Amer: >60 mL/min (ref >60)
GFR calc non Af Amer: 57 mL/min — ABNORMAL LOW (ref >60)
Glucose, Bld: 111 mg/dL — ABNORMAL HIGH (ref 70–99)
Potassium: 3.9 mmol/L (ref 3.5–5.1)
Sodium: 138 mmol/L (ref 135–145)
Total Bilirubin: 1.1 mg/dL (ref 0.3–1.2)
Total Protein: 6.8 g/dL (ref 6.5–8.1)

## 2018-12-01 LAB — SARS CORONAVIRUS 2 BY RT PCR (HOSPITAL ORDER, PERFORMED IN ~~LOC~~ HOSPITAL LAB): SARS Coronavirus 2: NEGATIVE

## 2018-12-01 LAB — TROPONIN I (HIGH SENSITIVITY): Troponin I (High Sensitivity): 4 ng/L (ref <18)

## 2018-12-01 MED ORDER — ALBUTEROL SULFATE HFA 108 (90 BASE) MCG/ACT IN AERS: 8.0000 | INHALATION_SPRAY | Freq: Once | RESPIRATORY_TRACT | Status: DC

## 2018-12-01 MED ORDER — MAGNESIUM SULFATE 2 GM/50ML IV SOLN
2.0000 g | Freq: Once | INTRAVENOUS | Status: AC
  Administered 2018-12-01: 2 g via INTRAVENOUS
  Filled 2018-12-01: qty 50

## 2018-12-01 MED ORDER — METHYLPREDNISOLONE SODIUM SUCC 125 MG IJ SOLR
125.0000 mg | Freq: Once | INTRAMUSCULAR | Status: AC
  Administered 2018-12-01: 125 mg via INTRAVENOUS
  Filled 2018-12-01: qty 2

## 2018-12-01 MED ORDER — ALBUTEROL SULFATE HFA 108 (90 BASE) MCG/ACT IN AERS
2.0000 | INHALATION_SPRAY | RESPIRATORY_TRACT | Status: DC | PRN
  Administered 2018-12-01: 2 via RESPIRATORY_TRACT
  Filled 2018-12-01: qty 6.7

## 2018-12-01 MED ORDER — IPRATROPIUM-ALBUTEROL 0.5-2.5 (3) MG/3ML IN SOLN
3.0000 mL | Freq: Once | RESPIRATORY_TRACT | Status: AC
  Administered 2018-12-01: 3 mL via RESPIRATORY_TRACT
  Filled 2018-12-01: qty 3

## 2018-12-01 MED ORDER — ALBUTEROL SULFATE HFA 108 (90 BASE) MCG/ACT IN AERS
2.0000 | INHALATION_SPRAY | Freq: Once | RESPIRATORY_TRACT | Status: AC
  Administered 2018-12-01: 8 via RESPIRATORY_TRACT
  Filled 2018-12-01: qty 6.7

## 2018-12-01 NOTE — ED Notes (Addendum)
Pt taken off NRB  and placed on [place] 2L

## 2018-12-01 NOTE — ED Triage Notes (Signed)
Pt comes via [facility] for asthma and SOB that has been going on for several days, increased inhaler use over the past few days, tripoding upon EMS arrival with wheezing in all fields, pt diaphoretic, no fevers.

## 2018-12-01 NOTE — ED Provider Notes (Signed)
[facility] Provider Note   CSN: [id number] Arrival date & time: 12/01/18  2008    History   Chief Complaint Chief Complaint  Patient presents with  . Respiratory Distress    HPI Jason Cardenas is a 58 y.o. male with history of moderate persistent asthma presenting to the ED via EMS complaining of respiratory distress, shortness of breath, and wheezing.  Patient reports his symptoms started this morning.  Patient denies any known asthma triggers.  He reports using his albuterol inhaler 3-4 times today.  He reports this evening he was feeling short of breath and his inhaler ran out after he was only able to take 1 puff.  EMS reports that on their arrival, patient was tachypneic, tripoding, and diaphoretic.  EMS placed patient on a nonrebreather.  They were unable to administer nebulizer treatments this time because of their COVID protocols.  Patient reports he is on albuterol only for his asthma and does not have any controlling medications.  Patient denies any recent fever, chills, sick contacts, abdominal pain, nausea, vomiting, diarrhea, or any other complaints.     The history is provided by the patient and the EMS personnel.    Past Medical History:  Diagnosis Date  . Asthma   . Prediabetes     Patient Active Problem List   Diagnosis Date Noted  . Allergic rhinitis due to allergen 02/27/2018  . Severe persistent asthma 11/17/2011  . Asthmatic bronchitis with exacerbation 10/02/2011    Past Surgical History:  Procedure Laterality Date  . TONSILLECTOMY          Home Medications    Prior to Admission medications   Medication Sig Start Date End Date Taking? Authorizing Provider  albuterol (VENTOLIN HFA) 108 (90 Base) MCG/ACT inhaler Inhale 1-2 puffs into the lungs every 6 (six) hours as needed for wheezing or shortness of breath. 11/14/18  Yes Cheron SchaumannSofia, Leslie K, PA-C  predniSONE (DELTASONE) 20 MG tablet Take 3 tablets (60 mg total)  by mouth daily. Patient not taking: Reported on 11/14/2018 10/15/18   Ward, Layla MawKristen N, DO  Fluticasone-Salmeterol (ADVAIR) 250-50 MCG/DOSE AEPB 1 puff 2 times daily Patient not taking: Reported on 11/14/2018 02/20/18 11/14/18  Claiborne RiggFleming, Zelda W, NP    Family History Family History  Problem Relation Age of Onset  . Diabetes Mother     Social History Social History   Tobacco Use  . Smoking status: Never Smoker  . Smokeless tobacco: Never Used  Substance Use Topics  . Alcohol use: Yes    Comment: socially   . Drug use: No     Allergies   Patient has no known allergies.   Review of Systems Review of Systems  Constitutional: Positive for diaphoresis. Negative for chills and fever.  HENT: Negative for ear pain and sore throat.   Eyes: Negative for pain and visual disturbance.  Respiratory: Positive for chest tightness, shortness of breath and wheezing.   Cardiovascular: Negative for chest pain and palpitations.  Gastrointestinal: Negative for abdominal pain and vomiting.  Genitourinary: Negative for dysuria and hematuria.  Musculoskeletal: Negative for arthralgias and back pain.  Skin: Negative for color change and rash.  Neurological: Negative for seizures and syncope.  All other systems reviewed and are negative.    Physical Exam Updated Vital Signs BP (!) 150/84   Pulse 94   Resp (!) 29   SpO2 100%   Physical Exam Vitals signs and nursing note reviewed.  Constitutional:  General: He is in acute distress.     Appearance: Normal appearance. He is normal weight. He is diaphoretic. He is not ill-appearing or toxic-appearing.  HENT:     Head: Normocephalic and atraumatic.     Nose: Nose normal. No congestion or rhinorrhea.     Mouth/Throat:     Pharynx: Oropharynx is clear. No oropharyngeal exudate or posterior oropharyngeal erythema.  Eyes:     Extraocular Movements: Extraocular movements intact.     Pupils: Pupils are equal, round, and reactive to light.   Neck:     Musculoskeletal: Normal range of motion and neck supple. No neck rigidity or muscular tenderness.  Cardiovascular:     Rate and Rhythm: Normal rate and regular rhythm.     Pulses: Normal pulses.     Heart sounds: Normal heart sounds. No murmur. No friction rub. No gallop.   Pulmonary:     Effort: Respiratory distress present.     Breath sounds: Wheezing present.     Comments: Tachypneic.  Tripoding.  Diffuse retractions.  Speaking in short sentences.  Poor air movement.  Diffuse wheezing in all lung fields. Abdominal:     General: Abdomen is flat. There is no distension.     Palpations: Abdomen is soft.     Tenderness: There is no abdominal tenderness. There is no guarding or rebound.  Musculoskeletal: Normal range of motion.        General: No swelling, tenderness, deformity or signs of injury.  Skin:    General: Skin is warm.     Findings: No rash.  Neurological:     General: No focal deficit present.     Mental Status: He is alert and oriented to person, place, and time. Mental status is at baseline.     Cranial Nerves: No cranial nerve deficit.     Sensory: No sensory deficit.     Motor: No weakness.  Psychiatric:        Mood and Affect: Mood normal.        Behavior: Behavior normal.      ED Treatments / Results  Labs (all labs ordered are listed, but only abnormal results are displayed) Labs Reviewed  CBC WITH DIFFERENTIAL/PLATELET - Abnormal; Notable for the following components:      Result Value   Eosinophils Absolute 0.7 (*)    All other components within normal limits  COMPREHENSIVE METABOLIC PANEL - Abnormal; Notable for the following components:   CO2 21 (*)    Glucose, Bld 111 (*)    Creatinine, Ser 1.38 (*)    GFR calc non Af Amer 57 (*)    All other components within normal limits  SARS CORONAVIRUS 2 (HOSPITAL ORDER, PERFORMED IN [facility])  BLOOD GAS, VENOUS  TROPONIN I (HIGH SENSITIVITY)    EKG None  Radiology Dg Chest  Port 1 View  Result Date: 12/01/2018 CLINICAL DATA:  Shortness of breath. EXAM: PORTABLE CHEST 1 VIEW COMPARISON:  November 14, 2018 FINDINGS: Cardiomediastinal silhouette is normal. Mediastinal contours appear intact. Calcific atherosclerotic disease of the aorta. There is no evidence of focal airspace consolidation, pleural effusion or pneumothorax. Osseous structures are without acute abnormality. Soft tissues are grossly normal. IMPRESSION: No active disease. Electronically Signed   By: Ted Mcalpineobrinka  Dimitrova M.D.   On: 12/01/2018 20:29    Procedures Procedures (including critical care time)  Medications Ordered in ED Medications  ipratropium-albuterol (DUONEB) 0.5-2.5 (3) MG/3ML nebulizer solution 3 mL (has no administration in time range)  magnesium  sulfate IVPB 2 g 50 mL (0 g Intravenous Stopped 12/01/18 2205)  methylPREDNISolone sodium succinate (SOLU-MEDROL) 125 mg/2 mL injection 125 mg (125 mg Intravenous Given 12/01/18 2021)  albuterol (VENTOLIN HFA) 108 (90 Base) MCG/ACT inhaler 2 puff (2 puffs Inhalation Given 12/01/18 2020)     Initial Impression / Assessment and Plan / ED Course  I have reviewed the triage vital signs and the nursing notes.  Pertinent labs & imaging results that were available during my care of the patient were reviewed by me and considered in my medical decision making (see chart for details).        Jason Cardenas is a 58 y.o. male with history of moderate persistent asthma presenting to the ED via EMS complaining of respiratory distress, shortness of breath, and wheezing.  On arrival to the ED, patient was diaphoretic, tachypneic, tripoding, had diffuse retractions, was speaking in short sentences, had diffuse wheezing in all lung fields, and had poor air movement.  Patient was given 125 mg of Solu-Medrol, 2 g of magnesium, and albuterol MDI puffs with some improvement of his symptoms.  Patient was weaned from nonrebreather to nasal cannula.  COVID-19 swab was  negative.  Chest x-ray showed hyperexpansion of the lungs without focal consolidation.  CMP and CBC were unremarkable.  On reassessment, patient continues to have diffuse wheezing and poor air movement.  Given the severity of patient's symptoms on initial presentation, he would likely benefit from admission.  Family medicine was contacted for admission.   Final Clinical Impressions(s) / ED Diagnoses   Final diagnoses:  Moderate persistent asthma with exacerbation    ED Discharge Orders    None       Candie Chroman, MD 12/01/18 2321    Drenda Freeze, MD 12/05/18 [phone]

## 2018-12-02 ENCOUNTER — Other Ambulatory Visit: Payer: Self-pay

## 2018-12-02 DIAGNOSIS — J45909 Unspecified asthma, uncomplicated: Secondary | ICD-10-CM | POA: Diagnosis present

## 2018-12-02 DIAGNOSIS — J4541 Moderate persistent asthma with (acute) exacerbation: Secondary | ICD-10-CM

## 2018-12-02 LAB — BASIC METABOLIC PANEL
Anion gap: 10 (ref 5–15)
BUN: 15 mg/dL (ref 6–20)
CO2: 22 mmol/L (ref 22–32)
Calcium: 9 mg/dL (ref 8.9–10.3)
Chloride: 106 mmol/L (ref 98–111)
Creatinine, Ser: 1.29 mg/dL — ABNORMAL HIGH (ref 0.61–1.24)
GFR calc Af Amer: >60 mL/min (ref >60)
GFR calc non Af Amer: >60 mL/min (ref >60)
Glucose, Bld: 193 mg/dL — ABNORMAL HIGH (ref 70–99)
Potassium: 4.4 mmol/L (ref 3.5–5.1)
Sodium: 138 mmol/L (ref 135–145)

## 2018-12-02 LAB — HEMOGLOBIN A1C
Hgb A1c MFr Bld: 5.8 % — ABNORMAL HIGH (ref 4.8–5.6)
Mean Plasma Glucose: 119.76 mg/dL

## 2018-12-02 LAB — HIV ANTIBODY (ROUTINE TESTING W REFLEX): HIV Screen 4th Generation wRfx: NONREACTIVE

## 2018-12-02 MED ORDER — FLUTICASONE-SALMETEROL 250-50 MCG/DOSE IN AEPB: 1.0000 | INHALATION_SPRAY | Freq: Two times a day (BID) | RESPIRATORY_TRACT | 1 refills | Status: DC

## 2018-12-02 MED ORDER — ALBUTEROL SULFATE (2.5 MG/3ML) 0.083% IN NEBU: 2.5000 mg | INHALATION_SOLUTION | RESPIRATORY_TRACT | Status: DC | PRN

## 2018-12-02 MED ORDER — ONDANSETRON HCL 4 MG PO TABS: 4.0000 mg | ORAL_TABLET | Freq: Four times a day (QID) | ORAL | Status: DC | PRN

## 2018-12-02 MED ORDER — PREDNISONE 20 MG PO TABS: 40.0000 mg | ORAL_TABLET | Freq: Every day | ORAL | 0 refills | Status: AC

## 2018-12-02 MED ORDER — ACETAMINOPHEN 650 MG RE SUPP: 650.0000 mg | Freq: Four times a day (QID) | RECTAL | Status: DC | PRN

## 2018-12-02 MED ORDER — ENOXAPARIN SODIUM 30 MG/0.3ML ~~LOC~~ SOLN
30.0000 mg | SUBCUTANEOUS | Status: DC
  Filled 2018-12-02: qty 0.3

## 2018-12-02 MED ORDER — ACETAMINOPHEN 325 MG PO TABS: 650.0000 mg | ORAL_TABLET | Freq: Four times a day (QID) | ORAL | Status: DC | PRN

## 2018-12-02 MED ORDER — PREDNISONE 20 MG PO TABS: 40.0000 mg | ORAL_TABLET | Freq: Every day | ORAL | 0 refills | Status: DC

## 2018-12-02 MED ORDER — ALBUTEROL SULFATE HFA 108 (90 BASE) MCG/ACT IN AERS: 1.0000 | INHALATION_SPRAY | Freq: Four times a day (QID) | RESPIRATORY_TRACT | Status: DC | PRN

## 2018-12-02 MED ORDER — PREDNISONE 20 MG PO TABS: 40.0000 mg | ORAL_TABLET | Freq: Every day | ORAL | Status: DC

## 2018-12-02 MED ORDER — ALBUTEROL SULFATE (2.5 MG/3ML) 0.083% IN NEBU
3.0000 mL | INHALATION_SOLUTION | RESPIRATORY_TRACT | Status: DC
  Administered 2018-12-02: 3 mL via RESPIRATORY_TRACT
  Filled 2018-12-02: qty 3

## 2018-12-02 MED ORDER — POLYETHYLENE GLYCOL 3350 17 G PO PACK: 17.0000 g | PACK | Freq: Every day | ORAL | Status: DC | PRN

## 2018-12-02 MED ORDER — ONDANSETRON HCL 4 MG/2ML IJ SOLN: 4.0000 mg | Freq: Four times a day (QID) | INTRAMUSCULAR | Status: DC | PRN

## 2018-12-02 NOTE — H&P (Addendum)
[facility] History and Physical Service Pager: [phone]  Patient name: Jason RuedDarryl L Cardenas Medical record number: [id number] Date of birth: 04-10-1961 Age: 58 y.o. Gender: male  Primary Care Provider: Claiborne RiggFleming, Zelda W, NP Consultants: Georges LynchVeera Monette [phone] Code Status: Full   Chief Complaint: Acute dyspnea  Assessment and Plan: Jacquan L Jason Cardenas is a 58 y.o. male presenting with acute dyspnea secondary to asthma exacerbation. PMH is significant for asthma.   Asthma exacerbation Patient's history suggests acute asthma exacerbation given increased dyspnea this week, increased use of his albuterol following changes in weather. Patient was tachypneic, tripoding and diaphoretic in the ED and initial chest exam revealed poor air movement and diffuse wheezing in all lung fields in the ED. Unable to speak in short sentences. Patient had good response to nebulizers and steroids in the ED suggestive of asthma exacerbation. Oxygen requirement also improved patient satting on room air after treatment.  On exam by the family medicine team, he is breathing comfortably on room air, not tachypneic, no wheezing, poor air movement in middle and lower fields.  COPD less likely as patient is a non-smoker. Infective causes such as pneumonia, or infective asthma exacerbation considered however patient denies cough or fevers, WBC 8. PE also considered due to tachypnea and hypoxia on admission, however Wells score 0.  Pneumothorax excluded based on chest x-ray findings and clinical exam.  History and physical exam are consistent with an acute asthma exacerbation.  Patient has a history of daily Advair or Symbicort which he no longer takes because he ran out.  He has poor follow-up with his PCP at this time.  He reports that he does experience seasonal allergies which have worsened lately. -Admit to MedSurg, attending Dr. Leveda AnnaHensel -S/p methylprednisolone -Prednisone 40 mg p.o. for 5  days -Albuterol MDI 4 puffs every 4 hours scheduled -Continue to assess respiratory status -Advair not on formulary.  Consider restarting Dulera or Brio Ellipta based on pharmacy recommendations and affordability.  Patient is currently uninsured. -Ensure follow-up with PCP for maintenance of asthma  Allergies-allergic rhinitis Usually has symptoms and high humidity after thunderstorms Currently does not take medications  Prediabetes Mr. Jason Cardenas would likely benefit from routine follow-up with his primary care physician.  He was last seen by his primary care provider 02/20/2018.  At that time, he was diagnosed with prediabetes.   -HbA1c -Continue monitor CBGs  FEN/GI: Normal diet Prophylaxis: Lovenox  Disposition: Home tomorrow pending resolution of asthma exacerbation  History of Present Illness:  Kimothy L Jason Cardenas is a 58 y.o. male presenting with acute dyspnea.  Reports that after thunderstorms and increased humidity in the air usually has asthma flares and requires more puffs of his albuterol.  Patient was helping with waxing the floors this afternoon.  Felt mildly short of breath whilst doing this labor and required 2 puffs of his albuterol.  Patient was walking towards the park later in the day and felt more short of breath requiring more puffs of his inhaler.  Altogether requiring 6 puffs of albuterol at which time he he finished the medication available from his inhaler.  After continuing to walk, decided to come to ED. He called EMS as he was unable to make it towards the hospital.  Denies use his inhaler more this week.  On a good day he does not require the inhaler often 0-1 use per day.  During his exacerbations he uses it 3-4 times a day. Previously has used advair and symbicort  and has had previous exacerbations of asthma.  ICU admission in 2016 but was not intubated.  Does not know his usual peak flow.  Denies cough, palpitations, chest pain, fevers, nausea or vomiting, abdominal  pain, urinary symptoms or change in bowel habits.   Patient arrived in the ED, tachypneic, tripoding and diaphoretic and placed on a nonrebreather.  Patient was given IV 125 mg methylprednisolone, 2 g magnesium sulfate, duo nebs and albuterol puffs with improvement of his symptoms.  It was weaned from nonrebreather to nasal cannula.  Patient still continued poor air movement throughout his lungs so was referred to family medicine inpatient.  Pertinent labs: CR 1.38, glucose 111.  CBC normal. COVID negative.  Review Of Systems: Per HPI with the following additions:   Review of Systems  Constitutional: Negative for fever and malaise/fatigue.  Eyes: Negative for blurred vision.  Respiratory: Positive for shortness of breath and wheezing. Negative for cough.   Cardiovascular: Negative for chest pain and palpitations.  Gastrointestinal: Positive for constipation. Negative for melena.  Genitourinary: Negative for dysuria and frequency.  Neurological: Negative for dizziness and headaches.    Patient Active Problem List   Diagnosis Date Noted  . Asthma due to environmental allergies 12/02/2018  . Allergic rhinitis due to allergen 02/27/2018  . Severe persistent asthma 11/17/2011  . Asthmatic bronchitis with exacerbation 10/02/2011    Past Medical History: Past Medical History:  Diagnosis Date  . Asthma   . Prediabetes     Past Surgical History: Past Surgical History:  Procedure Laterality Date  . TONSILLECTOMY      Social History: Social History   Tobacco Use  . Smoking status: Never Smoker  . Smokeless tobacco: Never Used  Substance Use Topics  . Alcohol use: Yes    Comment: socially   . Drug use: No   Additional social history: Lives alone, sometimes with his girlfriend. Denies smoking or illicit drug use Please also refer to relevant sections of EMR.  Family History: Family History  Problem Relation Age of Onset  . Diabetes Mother    Dad: cancer - unknown  source  Allergies and Medications: No Known Allergies No current facility-administered medications on file prior to encounter.    Current Outpatient Medications on File Prior to Encounter  Medication Sig Dispense Refill  . albuterol (VENTOLIN HFA) 108 (90 Base) MCG/ACT inhaler Inhale 1-2 puffs into the lungs every 6 (six) hours as needed for wheezing or shortness of breath. 1 g 1  . predniSONE (DELTASONE) 20 MG tablet Take 3 tablets (60 mg total) by mouth daily. (Patient not taking: Reported on 11/14/2018) 15 tablet 0  . [DISCONTINUED] Fluticasone-Salmeterol (ADVAIR) 250-50 MCG/DOSE AEPB 1 puff 2 times daily (Patient not taking: Reported on 11/14/2018) 60 each 6    Objective: BP 127/84   Pulse 92   Resp (!) 22   SpO2 93%  Exam: General: pleasant, alert, cooperative and in no acute distress. No oxygen requirement Eyes: normal EOM, no icterus, normal sclera and conjunctiva. ENTM: Normal mucous membranes, no erythema or exudates in pharynx, unable to visualize tonsils.  Neck: Supple and nontender, no thyroid cysts, thyromegaly, or lymphadenopathy Cardiovascular: S1 and S2 present, no S3 or S4, no rubs or gallops, cap refill less than 2 seconds Respiratory: Reduced air movement in mid to basal zones bilaterally, no crackles or wheeze, no acute respiratory distress Gastrointestinal: Abdomen soft nontender, bowel sounds present MSK: Moving all 4 limbs Derm: No rashes or ecchymosis Neuro: Cranial nerves II to XII grossly  intact Psych: Mood, normal affect  Labs and Imaging: CBC BMET  Recent Labs  Lab 12/01/18 2016  WBC 8.0  HGB 15.7  HCT 47.9  PLT 318   Recent Labs  Lab 12/01/18 2016  NA 138  K 3.9  CL 106  CO2 21*  BUN 18  CREATININE 1.38*  GLUCOSE 111*  CALCIUM 9.0     EKG  Dg Chest Port 1 View  Result Date: 12/01/2018 CLINICAL DATA:  Shortness of breath. EXAM: PORTABLE CHEST 1 VIEW COMPARISON:  November 14, 2018 FINDINGS: Cardiomediastinal silhouette is normal.  Mediastinal contours appear intact. Calcific atherosclerotic disease of the aorta. There is no evidence of focal airspace consolidation, pleural effusion or pneumothorax. Osseous structures are without acute abnormality. Soft tissues are grossly normal. IMPRESSION: No active disease. Electronically Signed   By: Ted Mcalpineobrinka  Dimitrova M.D.   On: 12/01/2018 20:29   Dg Chest Port 1 View  Result Date: 11/14/2018 CLINICAL DATA:  Cough, asthma EXAM: PORTABLE CHEST 1 VIEW COMPARISON:  10/15/2018 FINDINGS: The heart size and mediastinal contours are within normal limits. Both lungs are clear. The visualized skeletal structures are unremarkable. IMPRESSION: No active disease. Electronically Signed   By: Duanne GuessNicholas  Plundo M.D.   On: 11/14/2018 15:54     Towanda OctavePatel, Poonam, MD 12/02/2018, 2:26 AM PGY-1, [facility] FPTS Intern pager: [phone], text pages welcome  FPTS Upper-Level Resident Addendum   I have independently interviewed and examined the patient. I have discussed the above with the original author and agree with their documentation. My edits for correction/addition/clarification are in blue. Please see also any attending notes.    Mirian MoFrank, Cambria Osten MD PGY-1, [facility] 12/02/2018 4:32 AM  FPTS Service pager: [phone] (text pages welcome through [facility])

## 2018-12-02 NOTE — Discharge Instructions (Signed)
Thank you for allowing us to participate in your care!    You were admitted for an asthma exacerbation. Please continue using the inhalers we provided int he hospital daily. You may then use Advair with the written prescription provided.  We have also given you a prednisone burst.  Please take 2 tablets or 40 mg daily with breakfast for 4 more days.  It is very important that you follow-up with your PCP.  We have called [facility] and they are waiting you to schedule an appointment with them.  If you experience worsening of your admission symptoms, develop shortness of breath, life threatening emergency, suicidal or homicidal thoughts you must seek medical attention immediately by calling 911 or calling your MD immediately  if symptoms less severe.  Asthma, Adult  Asthma is a long-term (chronic) condition that causes recurrent episodes in which the airways become tight and narrow. The airways are the passages that lead from the nose and mouth down into the lungs. Asthma episodes, also called asthma attacks, can cause coughing, wheezing, shortness of breath, and chest pain. The airways can also fill with mucus. During an attack, it can be difficult to breathe. Asthma attacks can range from minor to life threatening. Asthma cannot be cured, but medicines and lifestyle changes can help control it and treat acute attacks. What are the causes? This condition is believed to be caused by inherited (genetic) and environmental factors, but its exact cause is not known. There are many things that can bring on an asthma attack or make asthma symptoms worse (triggers). Asthma triggers are different for each person. Common triggers include:  Mold.  Dust.  Cigarette smoke.  Cockroaches.  Things that can cause allergy symptoms (allergens), such as animal dander or pollen from trees or grass.  Air pollutants such as household cleaners, wood smoke, smog, or Therapist, occupationalchemical odors.  Cold air, weather  changes, and winds (which increase molds and pollen in the air).  Strong emotional expressions such as crying or laughing hard.  Stress.  Certain medicines (such as aspirin) or types of medicines (such as beta-blockers).  Sulfites in foods and drinks. Foods and drinks that may contain sulfites include dried fruit, potato chips, and sparkling grape juice.  Infections or inflammatory conditions such as the flu, a cold, or inflammation of the nasal membranes (rhinitis).  Gastroesophageal reflux disease (GERD).  Exercise or strenuous activity. What are the signs or symptoms? Symptoms of this condition may occur right after asthma is triggered or many hours later. Symptoms include:  Wheezing. This can sound like whistling when you breathe.  Excessive nighttime or early morning coughing.  Frequent or severe coughing with a common cold.  Chest tightness.  Shortness of breath.  Tiredness (fatigue) with minimal activity. How is this diagnosed? This condition is diagnosed based on:  Your medical history.  A physical exam.  Tests, which may include: ? Lung function studies and pulmonary studies (spirometry). These tests can evaluate the flow of air in your lungs. ? Allergy tests. ? Imaging tests, such as X-rays. How is this treated? There is no cure for this condition, but treatment can help control your symptoms. Treatment for asthma usually involves:  Identifying and avoiding your asthma triggers.  Using medicines to control your symptoms. Generally, two types of medicines are used to treat asthma: ? Controller medicines. These help prevent asthma symptoms from occurring. They are usually taken every day. ? Fast-acting reliever or rescue medicines. These quickly relieve asthma symptoms by  widening the narrow and tight airways. They are used as needed and provide short-term relief.  Using supplemental oxygen. This may be needed during a severe episode.  Using other medicines,  such as: ? Allergy medicines, such as antihistamines, if your asthma attacks are triggered by allergens. ? Immune medicines (immunomodulators). These are medicines that help control the immune system.  Creating an asthma action plan. An asthma action plan is a written plan for managing and treating your asthma attacks. This plan includes: ? A list of your asthma triggers and how to avoid them. ? Information about when medicines should be taken and when their dosage should be changed. ? Instructions about using a device called a peak flow meter. A peak flow meter measures how well the lungs are working and the severity of your asthma. It helps you monitor your condition. Follow these instructions at home: Controlling your home environment Control your home environment in the following ways to help avoid triggers and prevent asthma attacks:  Change your heating and air conditioning filter regularly.  Limit your use of fireplaces and wood stoves.  Get rid of pests (such as roaches and mice) and their droppings.  Throw away plants if you see mold on them.  Clean floors and dust surfaces regularly. Use unscented cleaning products.  Try to have someone else vacuum for you regularly. Stay out of rooms while they are being vacuumed and for a short while afterward. If you vacuum, use a dust mask from a hardware store, a double-layered or microfilter vacuum cleaner bag, or a vacuum cleaner with a HEPA filter.  Replace carpet with wood, tile, or vinyl flooring. Carpet can trap dander and dust.  Use allergy-proof pillows, mattress covers, and box spring covers.  Keep your bedroom a trigger-free room.  Avoid pets and keep windows closed when allergens are in the air.  Wash beddings every week in hot water and dry them in a dryer.  Use blankets that are made of polyester or cotton.  Clean bathrooms and kitchens with bleach. If possible, have someone repaint the walls in these rooms with  mold-resistant paint. Stay out of the rooms that are being cleaned and painted.  Wash your hands often with soap and water. If soap and water are not available, use hand sanitizer.  Do not allow anyone to smoke in your home. General instructions  Take over-the-counter and prescription medicines only as told by your health care provider. ? Speak with your health care provider if you have questions about how or when to take the medicines. ? Make note if you are requiring more frequent dosages.  Do not use any products that contain nicotine or tobacco, such as cigarettes and e-cigarettes. If you need help quitting, ask your health care provider. Also, avoid being exposed to secondhand smoke.  Use a peak flow meter as told by your health care provider. Record and keep track of the readings.  Understand and use the asthma action plan to help minimize, or stop an asthma attack, without needing to seek medical care.  Make sure you stay up to date on your yearly vaccinations as told by your health care provider. This may include vaccines for the flu and pneumonia.  Avoid outdoor activities when allergen counts are high and when air quality is low.  Wear a ski mask that covers your nose and mouth during outdoor winter activities. Exercise indoors on cold days if you can.  Warm up before exercising, and take time for a  cool-down period after exercise.  Keep all follow-up visits as told by your health care provider. This is important. Where to find more information  For information about asthma, turn to the Centers for Disease Control and Prevention at http://www.mills-berg.com/www.cdc.gov/asthma/faqs.htm  For air quality information, turn to AirNow at Gym[employer].nohttps://airnow.gov/ Contact a health care provider if:  You have wheezing, shortness of breath, or a cough even while you are taking medicine to prevent attacks.  The mucus you cough up (sputum) is thicker than usual.  Your sputum changes from clear or white to  yellow, green, gray, or bloody.  Your medicines are causing side effects, such as a rash, itching, swelling, or trouble breathing.  You need to use a reliever medicine more than 2-3 times a week.  Your peak flow reading is still at 50-79% of your personal best after following your action plan for 1 hour.  You have a fever. Get help right away if:  You are getting worse and do not respond to treatment during an asthma attack.  You are short of breath when at rest or when doing very little physical activity.  You have difficulty eating, drinking, or talking.  You have chest pain or tightness.  You develop a fast heartbeat or palpitations.  You have a bluish color to your lips or fingernails.  You are light-headed or dizzy, or you faint.  Your peak flow reading is less than 50% of your personal best.  You feel too tired to breathe normally. Summary  Asthma is a long-term (chronic) condition that causes recurrent episodes in which the airways become tight and narrow. These episodes can cause coughing, wheezing, shortness of breath, and chest pain.  Asthma cannot be cured, but medicines and lifestyle changes can help control it and treat acute attacks.  Make sure you understand how to avoid triggers and how and when to use your medicines.  Asthma attacks can range from minor to life threatening. Get help right away if you have an asthma attack and do not respond to treatment with your usual rescue medicines. This information is not intended to replace advice given to you by your health care provider. Make sure you discuss any questions you have with your health care provider. Document Released: 04/10/2005 Document Revised: 06/13/2018 Document Reviewed: 05/15/2016 Elsevier Patient Education  2020 [employer]

## 2018-12-02 NOTE — Plan of Care (Signed)
  Problem: Education: Goal: Knowledge of General Education information will improve Description: Including pain rating scale, medication(s)/side effects and non-pharmacologic comfort measures Outcome: Progressing   Problem: Clinical Measurements: Goal: Respiratory complications will improve Outcome: Progressing   Problem: Safety: Goal: Ability to remain free from injury will improve Outcome: Progressing   

## 2018-12-02 NOTE — Progress Notes (Signed)
Patient discharging home. Discharge instructions explained to patient and he verbalized understanding. Took all personal belongings. Prescription given to patient. No further questions or concerns voiced.

## 2018-12-02 NOTE — Discharge Summary (Signed)
[facility]  Patient name: Jason Cardenas Medical record number: [id number] Date of birth: 1960-10-15 Age: 58 y.o. Gender: male Date of Admission: 12/01/2018  Date of Discharge: 12/02/18 Admitting Physician: Moses MannersWilliam A Hensel, MD  Primary Care Provider: Claiborne RiggFleming, Zelda W, NP Consultants: None  Indication for Hospitalization: Asthma exacerbation  Discharge Diagnoses/Problem List:  Active Problems:   Asthma due to environmental allergies  Disposition: Patient discharged home  Discharge Condition: Stable  Discharge Exam:  General: Alert and cooperative and appears to be in no acute distress HEENT: Neck non-tender without lymphadenopathy, masses or thyromegaly Cardio: Normal S1 and S2, no S3 or S4. Rhythm is regular. No murmurs or rubs.   Pulm: Clear to auscultation bilaterally, no crackles.  Patient has scant wheezing, without diminished breath sounds. Normal respiratory effort. Abdomen: Bowel sounds normal. Abdomen soft and non-tender.  Extremities: No peripheral edema. Warm/ well perfused.  Strong radial and pedal pulses.  Brief Hospital Course:   Mr. [name] presented after running out of albuterol.  Patient presented with increased dyspnea and shortness of breath.  Patient reports that he is currently uninsured and has been unable to follow-up with providers as outpatient.   During this admission he was treated with methylprednisolone, albuterol, and recommended to complete a 5-day course of prednisone upon discharge.  Patient was recommended to have outpatient follow-up with primary care provider.  Spoke with primary care provider during this admission who stated they would be happy to see patient upon discharge within 2 weeks.  Patient reports that he is uninsured at this time and has been unable to afford medications.  Pharmacy discussed match options but  after speaking with patient's primary care provider it was determined that patient  [facility]  Patient was provided with Advair and prednisone printed prescriptions upon discharge.   Issues for Follow Up:  1. Asthma Medication with hospital follow up   Significant Procedures: None  Significant Labs and Imaging:  Recent Labs  Lab 12/01/18 2016  WBC 8.0  HGB 15.7  HCT 47.9  PLT 318   Recent Labs  Lab 12/01/18 2016 12/02/18 0440  NA 138 138  K 3.9 4.4  CL 106 106  CO2 21* 22  GLUCOSE 111* 193*  BUN 18 15  CREATININE 1.38* 1.29*  CALCIUM 9.0 9.0  ALKPHOS 55  --   AST 27  --   ALT 22  --   ALBUMIN 4.0  --     Results/Tests Pending at Time of Discharge: None  Discharge Medications:  Allergies as of 12/02/2018   No Known Allergies     Medication List    TAKE these medications   albuterol 108 (90 Base) MCG/ACT inhaler Commonly known as: VENTOLIN HFA Inhale 1-2 puffs into the lungs every 6 (six) hours as needed for wheezing or shortness of breath.   Fluticasone-Salmeterol 250-50 MCG/DOSE Aepb Commonly known as: Advair Diskus Inhale 1 puff into the lungs 2 (two) times daily.   predniSONE 20 MG tablet Commonly known as: DELTASONE Take 2 tablets (40 mg total) by mouth daily with breakfast for 4 days. What changed:   how much to take  when to take this       Discharge Instructions: Please refer to Patient Instructions section of EMR for full details.  Patient was counseled important signs and symptoms that should prompt return to medical care, changes in medications, dietary instructions, activity restrictions, and follow up appointments.  Follow-Up Appointments: Patient instructed to follow-up with PCP provider for medications.  Patient given prescription for Advair [facility].  Spoke with Daron Offer during this admission and she stated she would be happy to see patient upon discharge within 2 weeks.  Patient given contact number to schedule appointment.    Stark Klein, MD 12/05/2018, 3:03 PM PGY-1, [place]

## 2018-12-03 MED FILL — !ADVAIR 250/50 DISKUS: 250-50 | 30 days supply | Qty: 60 | Fill #1

## 2018-12-03 MED FILL — predniSONE 20 MG TABS: 20 | 4 days supply | Qty: 8 | Fill #0

## 2018-12-09 NOTE — Progress Notes (Signed)
Patient ID: Jason Cardenas, male   DOB: [birth year], 58 y.o.   MRN: [id number]  Virtual Visit via Telephone Note  I connected with Jason Cardenas on 12/11/18 at 10:50 AM EDT by telephone and verified that I am speaking with the correct person using two identifiers.   I discussed the limitations, risks, security and privacy concerns of performing an evaluation and management service by telephone and the availability of in person appointments. I also discussed with the patient that there may be a patient responsible charge related to this service. The patient expressed understanding and agreed to proceed.  Patient location:  home My Location:  [place] office Persons on the call:  Me and the patient.   History of Present Illness: After hospitalization from 8/9-8/01/2019 for asthma exacerbation.  Finished prednisone.  No SOB.  Not wheezing.  No fever  From discharge summary: Brief Hospital Course:   Jason Cardenas presented after running out of albuterol.  Patient presented with increased dyspnea and shortness of breath.  Patient reports that he is currently uninsured and has been unable to follow-up with providers as outpatient.   During this admission he was treated with methylprednisolone, albuterol, and recommended to complete a 5-day course of prednisone upon discharge.  Patient was recommended to have outpatient follow-up with primary care provider.  Spoke with primary care provider during this admission who stated they would be happy to see patient upon discharge within 2 weeks.  Patient reports that he is uninsured at this time and has been unable to afford medications.  Pharmacy discussed match options but  after speaking with patient's primary care provider it was determined that [facility]  Patient was provided with Advair and prednisone printed prescriptions upon discharge.   Observations/Objective:  A&Ox3   Assessment and  Plan: 1. Mild intermittent asthma without complication - Fluticasone-Salmeterol (ADVAIR DISKUS) 250-50 MCG/DOSE AEPB; Inhale 1 puff into the lungs 2 (two) times daily.  Dispense: 60 each; Refill: 3 - albuterol (VENTOLIN HFA) 108 (90 Base) MCG/ACT inhaler; Inhale 1-2 puffs into the lungs every 6 (six) hours as needed for wheezing or shortness of breath.  Dispense: 1 g; Refill: 1  2. Hospital follow up   Follow Up Instructions: See PCP in 3 months   I discussed the assessment and treatment plan with the patient. The patient was provided an opportunity to ask questions and all were answered. The patient agreed with the plan and demonstrated an understanding of the instructions.   The patient was advised to call back or seek an in-person evaluation if the symptoms worsen or if the condition fails to improve as anticipated.  I provided 8 minutes of non-face-to-face time during this encounter.   Freeman Caldron, PA-C

## 2018-12-11 ENCOUNTER — Other Ambulatory Visit: Payer: Self-pay

## 2018-12-11 ENCOUNTER — Ambulatory Visit: Payer: Self-pay | Attending: Nurse Practitioner | Admitting: Physician Assistant

## 2018-12-11 DIAGNOSIS — J4520 Mild intermittent asthma, uncomplicated: Secondary | ICD-10-CM

## 2018-12-11 DIAGNOSIS — Z09 Encounter for follow-up examination after completed treatment for conditions other than malignant neoplasm: Secondary | ICD-10-CM

## 2018-12-11 MED ORDER — FLUTICASONE-SALMETEROL 250-50 MCG/DOSE IN AEPB: 1.0000 | INHALATION_SPRAY | Freq: Two times a day (BID) | RESPIRATORY_TRACT | 3 refills | Status: DC

## 2018-12-11 MED ORDER — ALBUTEROL SULFATE HFA 108 (90 BASE) MCG/ACT IN AERS: 1.0000 | INHALATION_SPRAY | Freq: Four times a day (QID) | RESPIRATORY_TRACT | 1 refills | Status: DC | PRN

## 2018-12-11 MED FILL — !VENTOLIN HFA INHALER: 108 (90 BAS | 25 days supply | Qty: 18 | Fill #0

## 2018-12-11 NOTE — Progress Notes (Signed)
Patient verified DOB Patient has taken medication today and patient has not eaten today. Patient denies pain.

## 2019-01-26 IMAGING — DX DG CHEST 2V
2 series · 2 of 2 positions shown · non-contrast
Comparison: 10/07/2016

CLINICAL DATA: Wheezing and dyspnea and [DATE]

EXAM:
CHEST  2 VIEW

[chest pa]
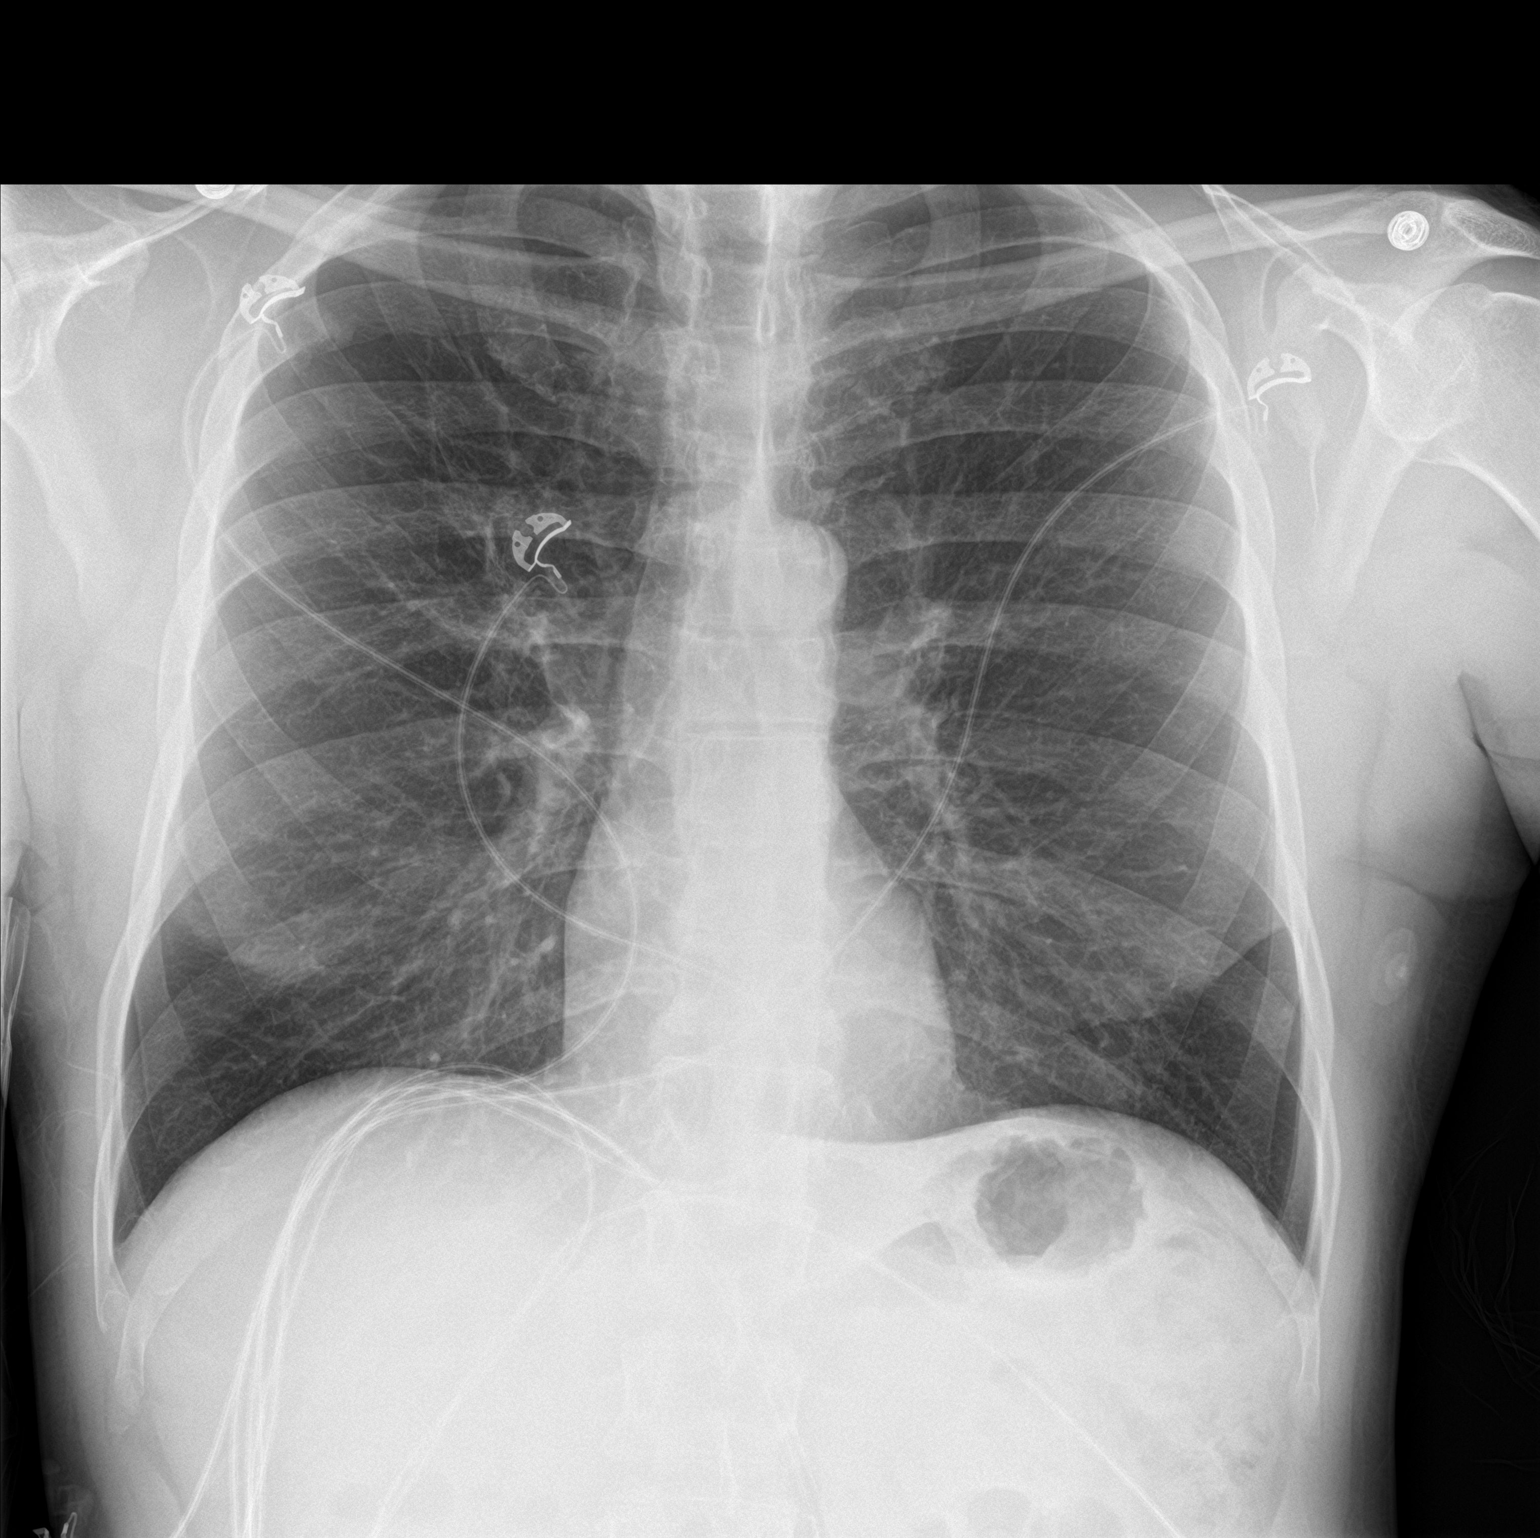

[chest lat]
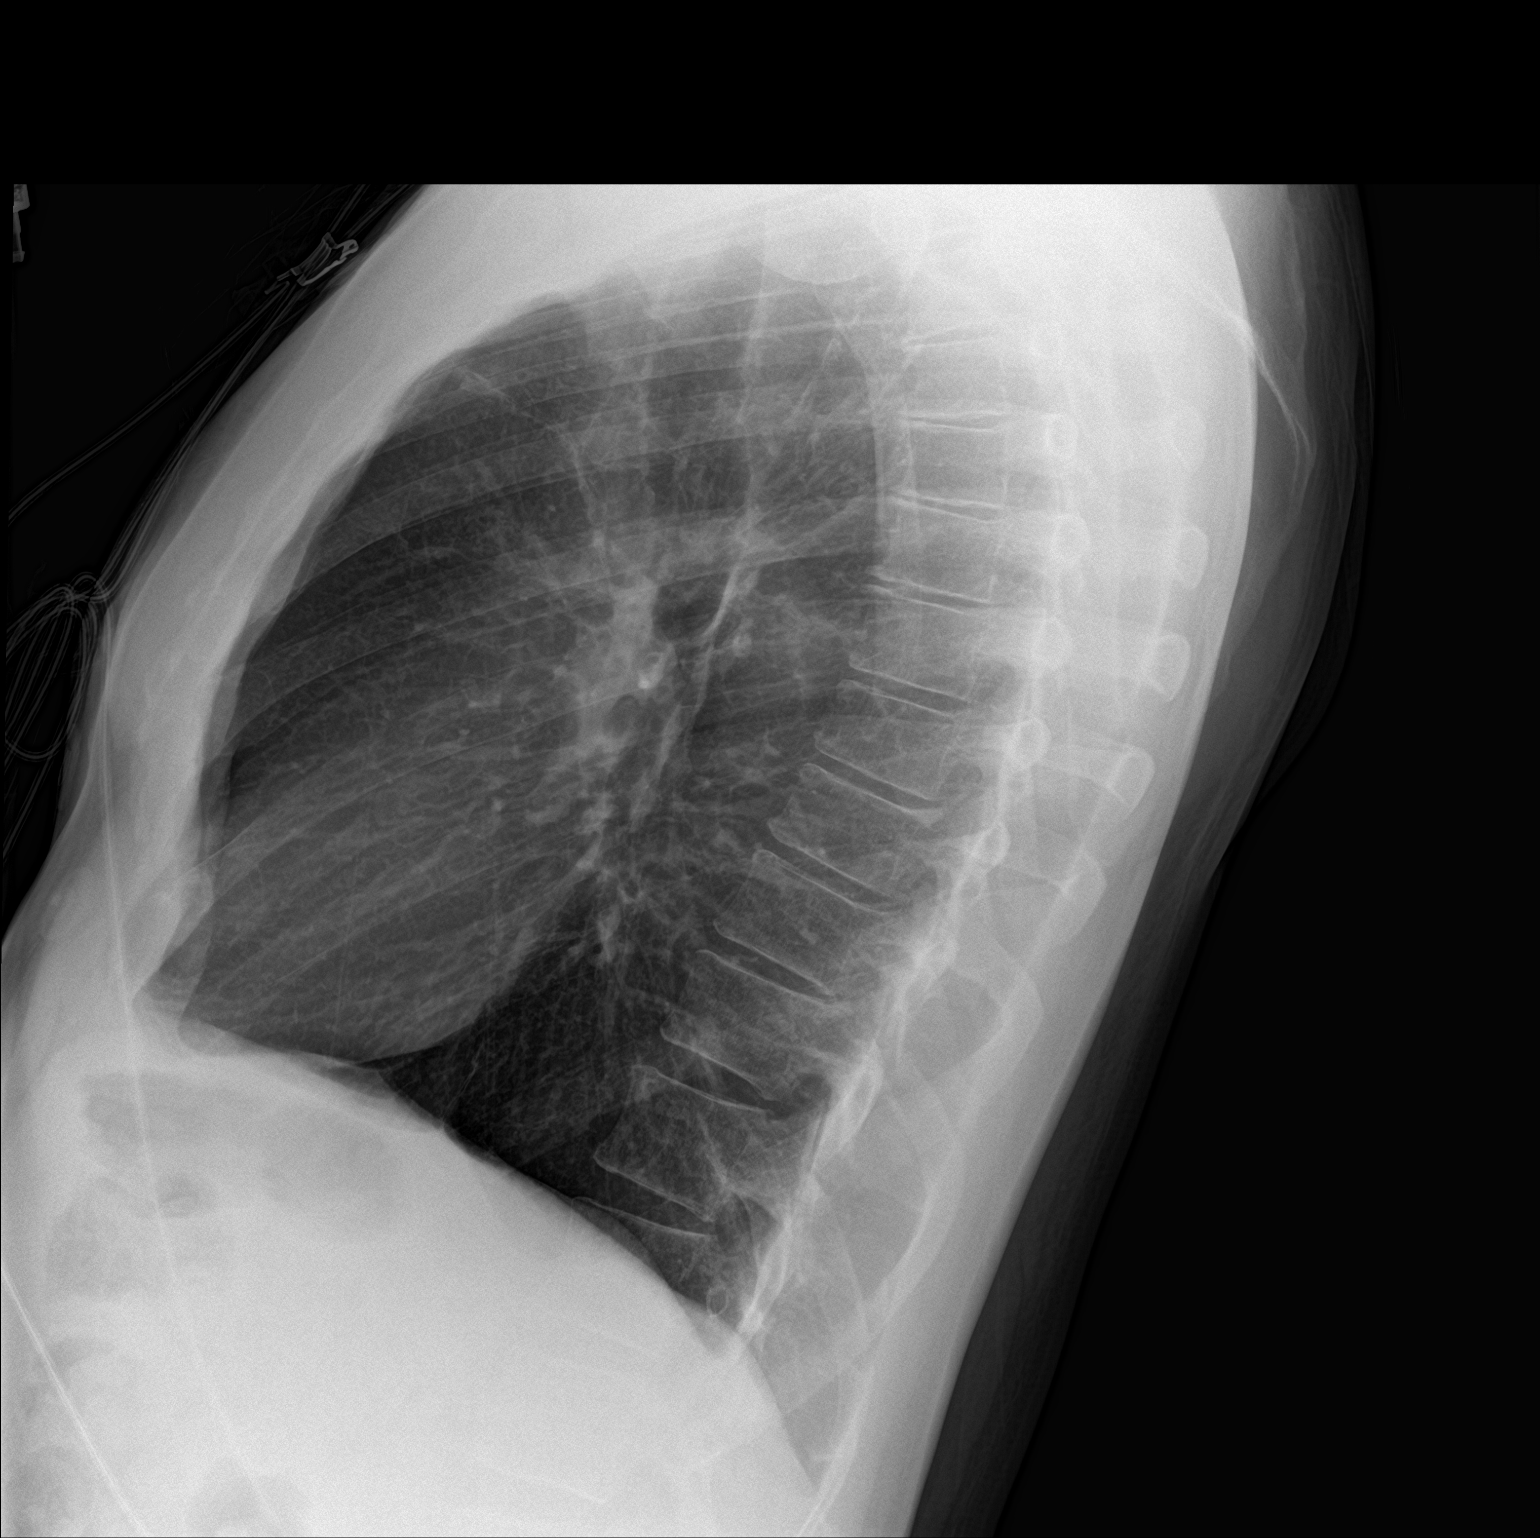

[2 of 2 positions shown; findings below may reference images not displayed]

FINDINGS: Moderate hyperinflation. The lungs are clear. The pulmonary
vasculature is normal. No pleural effusions. Normal heart size.
Hilar and mediastinal contours are unremarkable and unchanged.
IMPRESSION: Hyperinflation.  No consolidation or effusion.  Normal vasculature.

## 2019-02-08 ENCOUNTER — Emergency Department (HOSPITAL_COMMUNITY)
Admission: EM | Admit: 2019-02-08 | Discharge: 2019-02-08 | Disposition: A | Discharge status: Home / Self Care | Payer: Self-pay | Attending: Emergency Medicine | Admitting: Emergency Medicine

## 2019-02-08 ENCOUNTER — Emergency Department (HOSPITAL_COMMUNITY): Payer: Self-pay

## 2019-02-08 ENCOUNTER — Other Ambulatory Visit: Payer: Self-pay

## 2019-02-08 ENCOUNTER — Encounter (HOSPITAL_COMMUNITY): Payer: Self-pay | Admitting: Emergency Medicine

## 2019-02-08 DIAGNOSIS — Z7722 Contact with and (suspected) exposure to environmental tobacco smoke (acute) (chronic): Secondary | ICD-10-CM

## 2019-02-08 DIAGNOSIS — J4521 Mild intermittent asthma with (acute) exacerbation: Secondary | ICD-10-CM

## 2019-02-08 DIAGNOSIS — J4520 Mild intermittent asthma, uncomplicated: Secondary | ICD-10-CM

## 2019-02-08 LAB — CBC WITH DIFFERENTIAL/PLATELET
Abs Immature Granulocytes: 0.02 10*3/uL (ref 0.00–0.07)
Basophils Absolute: 0 10*3/uL (ref 0.0–0.1)
Basophils Relative: 0 %
Eosinophils Absolute: 0.3 10*3/uL (ref 0.0–0.5)
Eosinophils Relative: 4 %
HCT: 51.3 % (ref 39.0–52.0)
Hemoglobin: 16.9 g/dL (ref 13.0–17.0)
Immature Granulocytes: 0 %
Lymphocytes Relative: 22 %
Lymphs Abs: 1.6 10*3/uL (ref 0.7–4.0)
MCH: 28.1 pg (ref 26.0–34.0)
MCHC: 32.9 g/dL (ref 30.0–36.0)
MCV: 85.2 fL (ref 80.0–100.0)
Monocytes Absolute: 0.4 10*3/uL (ref 0.1–1.0)
Monocytes Relative: 6 %
Neutro Abs: 4.9 10*3/uL (ref 1.7–7.7)
Neutrophils Relative %: 68 %
Platelets: 322 10*3/uL (ref 150–400)
RBC: 6.02 MIL/uL — ABNORMAL HIGH (ref 4.22–5.81)
RDW: 14.5 % (ref 11.5–15.5)
WBC: 7.3 10*3/uL (ref 4.0–10.5)
nRBC: 0 % (ref 0.0–0.2)

## 2019-02-08 LAB — BASIC METABOLIC PANEL
Anion gap: 8 (ref 5–15)
BUN: 14 mg/dL (ref 6–20)
CO2: 26 mmol/L (ref 22–32)
Calcium: 9 mg/dL (ref 8.9–10.3)
Chloride: 106 mmol/L (ref 98–111)
Creatinine, Ser: 1.37 mg/dL — ABNORMAL HIGH (ref 0.61–1.24)
GFR calc Af Amer: >60 mL/min (ref >60)
GFR calc non Af Amer: 57 mL/min — ABNORMAL LOW (ref >60)
Glucose, Bld: 96 mg/dL (ref 70–99)
Potassium: 4.4 mmol/L (ref 3.5–5.1)
Sodium: 140 mmol/L (ref 135–145)

## 2019-02-08 MED ORDER — FLUTICASONE-SALMETEROL 250-50 MCG/DOSE IN AEPB: 1.0000 | INHALATION_SPRAY | Freq: Two times a day (BID) | RESPIRATORY_TRACT | 0 refills | Status: DC

## 2019-02-08 MED ORDER — AEROCHAMBER PLUS FLO-VU LARGE MISC
1.0000 | Freq: Once | Status: AC
  Administered 2019-02-08: 10:00:00 1

## 2019-02-08 MED ORDER — METHYLPREDNISOLONE SODIUM SUCC 125 MG IJ SOLR
125.0000 mg | Freq: Once | INTRAMUSCULAR | Status: AC
  Administered 2019-02-08: 125 mg via INTRAVENOUS
  Filled 2019-02-08: qty 2

## 2019-02-08 MED ORDER — PREDNISONE 10 MG (21) PO TBPK: ORAL_TABLET | ORAL | 0 refills | Status: DC

## 2019-02-08 MED ORDER — IPRATROPIUM-ALBUTEROL 0.5-2.5 (3) MG/3ML IN SOLN
3.0000 mL | Freq: Once | RESPIRATORY_TRACT | Status: AC
  Administered 2019-02-08: 3 mL via RESPIRATORY_TRACT
  Filled 2019-02-08: qty 3

## 2019-02-08 MED ORDER — ALBUTEROL SULFATE HFA 108 (90 BASE) MCG/ACT IN AERS: 1.0000 | INHALATION_SPRAY | Freq: Four times a day (QID) | RESPIRATORY_TRACT | 1 refills | Status: DC | PRN

## 2019-02-08 MED ORDER — ALBUTEROL SULFATE HFA 108 (90 BASE) MCG/ACT IN AERS
4.0000 | INHALATION_SPRAY | Freq: Once | RESPIRATORY_TRACT | Status: AC
  Administered 2019-02-08: 4 via RESPIRATORY_TRACT
  Filled 2019-02-08: qty 6.7

## 2019-02-08 MED ORDER — AEROCHAMBER PLUS FLO-VU LARGE MISC
Status: AC
  Administered 2019-02-08: 1
  Filled 2019-02-08: qty 1

## 2019-02-08 NOTE — Discharge Instructions (Addendum)
Avoid cigarette smoke

## 2019-02-08 NOTE — ED Notes (Signed)
Pt given dc instructions pt verbalizes understanding.  

## 2019-02-08 NOTE — ED Triage Notes (Signed)
Pt here with asthma attack his inhaler ran out this morning and his pharmacy is closed.

## 2019-02-08 NOTE — ED Provider Notes (Signed)
[facility] EMERGENCY DEPARTMENT Provider Note   CSN: [id number] Arrival date & time: 02/08/19  9924     History   Chief Complaint Chief Complaint  Patient presents with   Shortness of Breath    HPI Jason Cardenas is a 58 y.o. male.     Pt presents to the ED with sob.  The pt has a hx of asthma and is out of his medications.  He has been around people who smoke.  He denies any f/c.  He denies any known covid exposures.     Past Medical History:  Diagnosis Date   Asthma    Prediabetes     Patient Active Problem List   Diagnosis Date Noted   Asthma due to environmental allergies 12/02/2018   Allergic rhinitis due to allergen 02/27/2018   Severe persistent asthma 11/17/2011   Asthmatic bronchitis with exacerbation 10/02/2011    Past Surgical History:  Procedure Laterality Date   TONSILLECTOMY          Home Medications    Prior to Admission medications   Medication Sig Start Date End Date Taking? Authorizing Provider  albuterol (VENTOLIN HFA) 108 (90 Base) MCG/ACT inhaler Inhale 1-2 puffs into the lungs every 6 (six) hours as needed for wheezing or shortness of breath. 02/08/19   Jacalyn Lefevre, MD  Fluticasone-Salmeterol (ADVAIR DISKUS) 250-50 MCG/DOSE AEPB Inhale 1 puff into the lungs 2 (two) times daily. 02/08/19 03/10/19  Jacalyn Lefevre, MD  predniSONE (STERAPRED UNI-PAK 21 TAB) 10 MG (21) TBPK tablet Take 6 tabs for 2 days, then 5 for 2 days, then 4 for 2 days, then 3 for 2 days, 2 for 2 days, then 1 for 2 days 02/08/19   Jacalyn Lefevre, MD    Family History Family History  Problem Relation Age of Onset   Diabetes Mother     Social History Social History   Tobacco Use   Smoking status: Never Smoker   Smokeless tobacco: Never Used  Substance Use Topics   Alcohol use: Yes    Comment: socially    Drug use: No     Allergies   Patient has no known allergies.   Review of Systems Review of Systems    Respiratory: Positive for shortness of breath and wheezing.   All other systems reviewed and are negative.    Physical Exam Updated Vital Signs BP 138/84    Pulse 83    Temp 97.8 F (36.6 C) (Oral)    Resp (!) 25    SpO2 96%   Physical Exam Vitals signs and nursing note reviewed.  Constitutional:      Appearance: He is well-developed.  HENT:     Head: Normocephalic and atraumatic.     Mouth/Throat:     Mouth: Mucous membranes are moist.  Eyes:     Pupils: Pupils are equal, round, and reactive to light.  Neck:     Musculoskeletal: Normal range of motion and neck supple.  Cardiovascular:     Rate and Rhythm: Normal rate and regular rhythm.  Pulmonary:     Effort: Tachypnea present.     Breath sounds: Wheezing present.  Abdominal:     General: Bowel sounds are normal.     Palpations: Abdomen is soft.  Musculoskeletal: Normal range of motion.  Skin:    General: Skin is warm.     Capillary Refill: Capillary refill takes less than 2 seconds.  Neurological:     General: No focal deficit present.  Mental Status: He is alert and oriented to person, place, and time.  Psychiatric:        Mood and Affect: Mood normal.        Behavior: Behavior normal.      ED Treatments / Results  Labs (all labs ordered are listed, but only abnormal results are displayed) Labs Reviewed  BASIC METABOLIC PANEL - Abnormal; Notable for the following components:      Result Value   Creatinine, Ser 1.37 (*)    GFR calc non Af Amer 57 (*)    All other components within normal limits  CBC WITH DIFFERENTIAL/PLATELET - Abnormal; Notable for the following components:   RBC 6.02 (*)    All other components within normal limits    EKG None  Radiology Dg Chest Portable 1 View  Result Date: 02/08/2019 CLINICAL DATA:  Shortness of breath EXAM: PORTABLE CHEST 1 VIEW COMPARISON:  12/01/2018; 11/14/2018 FINDINGS: Grossly unchanged cardiac silhouette and mediastinal contours. The lungs remain  hyperexpanded with flattening of the diaphragms. No focal airspace opacities. No pleural effusion or pneumothorax. No evidence of edema. No acute osseous abnormalities. IMPRESSION: Hyperexpanded lungs without superimposed acute cardiopulmonary disease. Electronically Signed   By: Sandi Mariscal M.D.   On: 02/08/2019 09:43    Procedures Procedures (including critical care time)  Medications Ordered in ED Medications  albuterol (VENTOLIN HFA) 108 (90 Base) MCG/ACT inhaler 4 puff (4 puffs Inhalation Given 02/08/19 0935)  methylPREDNISolone sodium succinate (SOLU-MEDROL) 125 mg/2 mL injection 125 mg (125 mg Intravenous Given 02/08/19 0936)  AeroChamber Plus Flo-Vu Large MISC 1 each (1 each Other Given 02/08/19 0936)  ipratropium-albuterol (DUONEB) 0.5-2.5 (3) MG/3ML nebulizer solution 3 mL (3 mLs Nebulization Given 02/08/19 4098)     Initial Impression / Assessment and Plan / ED Course  I have reviewed the triage vital signs and the nursing notes.  Pertinent labs & imaging results that were available during my care of the patient were reviewed by me and considered in my medical decision making (see chart for details).       Pt is feeling much better.  He feels like he is ready to go home.  He ran out of his meds, so his meds will be refilled.  He will also be given a prednisone taper.   He is told to avoid cigarette smoke.  Return if worse.  Final Clinical Impressions(s) / ED Diagnoses   Final diagnoses:  Mild intermittent asthma with exacerbation  Exposure to secondhand smoke    ED Discharge Orders         Ordered    albuterol (VENTOLIN HFA) 108 (90 Base) MCG/ACT inhaler  Every 6 hours PRN     02/08/19 1014    Fluticasone-Salmeterol (ADVAIR DISKUS) 250-50 MCG/DOSE AEPB  2 times daily     02/08/19 1014    predniSONE (STERAPRED UNI-PAK 21 TAB) 10 MG (21) TBPK tablet     02/08/19 1014           Isla Pence, MD 02/08/19 1035

## 2019-07-05 ENCOUNTER — Encounter (HOSPITAL_COMMUNITY): Payer: Self-pay | Admitting: Emergency Medicine

## 2019-07-05 ENCOUNTER — Emergency Department (HOSPITAL_COMMUNITY)
Admission: EM | Admit: 2019-07-05 | Discharge: 2019-07-05 | Disposition: A | Discharge status: Home / Self Care | Payer: Self-pay | Attending: Emergency Medicine | Admitting: Emergency Medicine

## 2019-07-05 ENCOUNTER — Other Ambulatory Visit: Payer: Self-pay

## 2019-07-05 DIAGNOSIS — Z20822 Contact with and (suspected) exposure to covid-19: Secondary | ICD-10-CM | POA: Insufficient documentation

## 2019-07-05 DIAGNOSIS — J4521 Mild intermittent asthma with (acute) exacerbation: Secondary | ICD-10-CM

## 2019-07-05 LAB — POC SARS CORONAVIRUS 2 AG -  ED: SARS Coronavirus 2 Ag: NEGATIVE

## 2019-07-05 MED ORDER — ALBUTEROL SULFATE HFA 108 (90 BASE) MCG/ACT IN AERS
8.0000 | INHALATION_SPRAY | Freq: Once | RESPIRATORY_TRACT | Status: AC
  Administered 2019-07-05: 8 via RESPIRATORY_TRACT
  Filled 2019-07-05: qty 6.7

## 2019-07-05 MED ORDER — FLUTICASONE-SALMETEROL 250-50 MCG/DOSE IN AEPB: 1.0000 | INHALATION_SPRAY | Freq: Every day | RESPIRATORY_TRACT | 1 refills | Status: DC

## 2019-07-05 MED ORDER — PREDNISONE 20 MG PO TABS: 40.0000 mg | ORAL_TABLET | Freq: Every day | ORAL | 0 refills | Status: DC

## 2019-07-05 MED ORDER — ALBUTEROL (5 MG/ML) CONTINUOUS INHALATION SOLN
15.0000 mg/h | INHALATION_SOLUTION | RESPIRATORY_TRACT | Status: AC
  Administered 2019-07-05 (×2): 15 mg/h via RESPIRATORY_TRACT
  Filled 2019-07-05: qty 20

## 2019-07-05 MED ORDER — PREDNISONE 20 MG PO TABS
40.0000 mg | ORAL_TABLET | Freq: Once | ORAL | Status: AC
  Administered 2019-07-05: 40 mg via ORAL
  Filled 2019-07-05: qty 2

## 2019-07-05 NOTE — ED Notes (Signed)
TC to RESP per request of DR Little to give Pt a one hr neb.

## 2019-07-05 NOTE — ED Triage Notes (Signed)
C/o asthma flare-up since 4am with SOB and wheezing.  Denies cough.  States he used Albuterol inhaler 7-8 times this morning with no relief.

## 2019-07-05 NOTE — ED Provider Notes (Signed)
[facility] EMERGENCY DEPARTMENT Provider Note   CSN: [id number] Arrival date & time: 07/05/19  4098     History Chief Complaint  Patient presents with  . Shortness of Breath    Jason Cardenas is a 59 y.o. male.  59 year old male with past medical history including asthma and prediabetes who presents with shortness of breath and wheezing.  Patient states that at 4 AM he began having an asthma exacerbation with shortness of breath and wheezing.  This feels like his usual asthma exacerbations.  He reports using his albuterol inhaler a few times throughout the day yesterday but otherwise was in his usual state of health.  This morning he has used his inhaler 7-8 times without much relief.  [facility] and started on Advair but he ran out of it about a month ago.  He denies any other associated symptoms including no cough, sore throat, fevers, body aches, headache, chest pain, sick contacts, or recent illness.  No leg swelling or pain.  The history is provided by the patient.  Shortness of Breath      Past Medical History:  Diagnosis Date  . Asthma   . Prediabetes     Patient Active Problem List   Diagnosis Date Noted  . Asthma due to environmental allergies 12/02/2018  . Allergic rhinitis due to allergen 02/27/2018  . Severe persistent asthma 11/17/2011  . Asthmatic bronchitis with exacerbation 10/02/2011    Past Surgical History:  Procedure Laterality Date  . TONSILLECTOMY         Family History  Problem Relation Age of Onset  . Diabetes Mother     Social History   Tobacco Use  . Smoking status: Never Smoker  . Smokeless tobacco: Never Used  Substance Use Topics  . Alcohol use: Yes    Comment: socially   . Drug use: No    Home Medications Prior to Admission medications   Medication Sig Start Date End Date Taking? Authorizing Provider  albuterol (VENTOLIN HFA) 108 (90 Base) MCG/ACT  inhaler Inhale 1-2 puffs into the lungs every 6 (six) hours as needed for wheezing or shortness of breath. 02/08/19   Jacalyn Lefevre, MD  Fluticasone-Salmeterol (ADVAIR DISKUS) 250-50 MCG/DOSE AEPB Inhale 1 puff into the lungs 2 (two) times daily. 02/08/19 03/10/19  Jacalyn Lefevre, MD  predniSONE (STERAPRED UNI-PAK 21 TAB) 10 MG (21) TBPK tablet Take 6 tabs for 2 days, then 5 for 2 days, then 4 for 2 days, then 3 for 2 days, 2 for 2 days, then 1 for 2 days 02/08/19   Jacalyn Lefevre, MD    Allergies    Patient has no known allergies.  Review of Systems   Review of Systems  Respiratory: Positive for shortness of breath.    All other systems reviewed and are negative except that which was mentioned in HPI  Physical Exam Updated Vital Signs BP (!) 145/87 (BP Location: Right Arm)   Pulse 99   Temp 98 F (36.7 C) (Oral)   Resp 16   SpO2 97%   Physical Exam Vitals and nursing note reviewed.  Constitutional:      General: He is not in acute distress.    Appearance: He is well-developed.  HENT:     Head: Normocephalic and atraumatic.  Eyes:     Conjunctiva/sclera: Conjunctivae normal.  Cardiovascular:     Rate and Rhythm: Normal rate and regular rhythm.  Heart sounds: Normal heart sounds. No murmur.  Pulmonary:     Comments: Mildly increased WOB but able to speak in full sentences, severely diminished BS b/l with faint scattered wheezes in upper lung fields b/l Abdominal:     General: Bowel sounds are normal. There is no distension.     Palpations: Abdomen is soft.     Tenderness: There is no abdominal tenderness.  Musculoskeletal:     Cervical back: Neck supple.     Right lower leg: No edema.     Left lower leg: No edema.  Skin:    General: Skin is warm and dry.  Neurological:     Mental Status: He is alert and oriented to person, place, and time.     Comments: Fluent speech  Psychiatric:        Judgment: Judgment normal.     Comments: pleasant     ED Results /  Procedures / Treatments   Labs (all labs ordered are listed, but only abnormal results are displayed) Labs Reviewed  POC SARS CORONAVIRUS 2 AG -  ED    EKG EKG Interpretation  Date/Time:  Saturday July 05 2019 07:22:04 EST Ventricular Rate:  95 PR Interval:  174 QRS Duration: 76 QT Interval:  366 QTC Calculation: 459 R Axis:   91 Text Interpretation: Normal sinus rhythm Rightward axis Pulmonary disease pattern Abnormal ECG No significant change since last tracing Confirmed by Theotis Burrow [phone]) on 07/05/2019 7:46:35 AM   Radiology No results found.  Procedures Procedures (including critical care time) CRITICAL CARE Performed by: Wenda Overland Joby Richart   Total critical care time: 30 minutes  Critical care time was exclusive of separately billable procedures and treating other patients.  Critical care was necessary to treat or prevent imminent or life-threatening deterioration.  Critical care was time spent personally by me on the following activities: development of treatment plan with patient and/or surrogate as well as nursing, discussions with consultants, evaluation of patient's response to treatment, examination of patient, obtaining history from patient or surrogate, ordering and performing treatments and interventions, ordering and review of laboratory studies, ordering and review of radiographic studies, pulse oximetry and re-evaluation of patient's condition.  Medications Ordered in ED Medications  predniSONE (DELTASONE) tablet 40 mg (has no administration in time range)  albuterol (VENTOLIN HFA) 108 (90 Base) MCG/ACT inhaler 8 puff (has no administration in time range)    ED Course  I have reviewed the triage vital signs and the nursing notes.  Pertinent labs that were available during my care of the patient were reviewed by me and considered in my medical decision making (see chart for details).    MDM Rules/Calculators/A&P                      Pt very  diminished on exam, VS reassuring and O2 sat 97% on RA. No infectious symptoms, obtained COVID testing for screening prior to placing on continuous albuterol. Gave prednisone.  On reassessment after 1 hour continuous, patient was alert and comfortable stating that he felt much better.  Vital signs reassuring.  Recommended restarting advair and f/u appt w/ [facility].  Provided with prednisone course and albuterol to use at home.  Reviewed return precautions and he voiced understanding.  Final Clinical Impression(s) / ED Diagnoses Final diagnoses:  None    Rx / DC Orders ED Discharge Orders    None       Mariany Mackintosh, Wenda Overland, MD 07/05/19 1539

## 2019-09-01 ENCOUNTER — Ambulatory Visit: Payer: Self-pay | Attending: Internal Medicine

## 2019-09-01 DIAGNOSIS — Z23 Encounter for immunization: Secondary | ICD-10-CM

## 2019-09-01 NOTE — Progress Notes (Signed)
   Covid-19 Vaccination Clinic  Name:  JASIM HARARI    MRN: [id number] DOB: Aug 23, 1960  09/01/2019  Mr. Cala was observed post Covid-19 immunization for 15 minutes without incident. He was provided with Vaccine Information Sheet and instruction to access the V-Safe system.   Mr. Urizar was instructed to call 911 with any severe reactions post vaccine: Marland Kitchen Difficulty breathing  . Swelling of face and throat  . A fast heartbeat  . A bad rash all over body  . Dizziness and weakness   Immunizations Administered    Name Date Dose VIS Date Route   Moderna COVID-19 Vaccine 09/01/2019  5:04 PM 0.5 mL 03/2019 Intramuscular   Manufacturer: Moderna   Lot: [id number]   NDC: 91225-834-62

## 2019-09-29 ENCOUNTER — Ambulatory Visit: Payer: Self-pay | Attending: Internal Medicine

## 2019-09-29 DIAGNOSIS — Z23 Encounter for immunization: Secondary | ICD-10-CM

## 2019-09-29 NOTE — Progress Notes (Signed)
   Covid-19 Vaccination Clinic  Name:  GIBBS NAUGLE    MRN: [id number] DOB: 10-22-1960  09/29/2019  Mr. Silveria was observed post Covid-19 immunization for 15 minutes without incident. He was provided with Vaccine Information Sheet and instruction to access the V-Safe system.   Mr. Fischman was instructed to call 911 with any severe reactions post vaccine: Marland Kitchen Difficulty breathing  . Swelling of face and throat  . A fast heartbeat  . A bad rash all over body  . Dizziness and weakness   Immunizations Administered    Name Date Dose VIS Date Route   Moderna COVID-19 Vaccine 09/29/2019  4:48 PM 0.5 mL 03/2019 Intramuscular   Manufacturer: Moderna   Lot: [id number]   NDC: 66294-765-46

## 2020-11-04 ENCOUNTER — Emergency Department (HOSPITAL_COMMUNITY)
Admission: EM | Admit: 2020-11-04 | Discharge: 2020-11-04 | Disposition: A | Discharge status: Home / Self Care | Payer: Self-pay | Attending: Emergency Medicine | Admitting: Emergency Medicine

## 2020-11-04 ENCOUNTER — Emergency Department (HOSPITAL_COMMUNITY): Payer: Self-pay

## 2020-11-04 ENCOUNTER — Encounter (HOSPITAL_COMMUNITY): Payer: Self-pay | Admitting: Emergency Medicine

## 2020-11-04 ENCOUNTER — Other Ambulatory Visit: Payer: Self-pay

## 2020-11-04 DIAGNOSIS — G8929 Other chronic pain: Secondary | ICD-10-CM | POA: Insufficient documentation

## 2020-11-04 DIAGNOSIS — I129 Hypertensive chronic kidney disease with stage 1 through stage 4 chronic kidney disease, or unspecified chronic kidney disease: Secondary | ICD-10-CM | POA: Insufficient documentation

## 2020-11-04 DIAGNOSIS — Z79899 Other long term (current) drug therapy: Secondary | ICD-10-CM | POA: Insufficient documentation

## 2020-11-04 DIAGNOSIS — F17210 Nicotine dependence, cigarettes, uncomplicated: Secondary | ICD-10-CM | POA: Insufficient documentation

## 2020-11-04 DIAGNOSIS — N189 Chronic kidney disease, unspecified: Secondary | ICD-10-CM | POA: Insufficient documentation

## 2020-11-04 DIAGNOSIS — J4541 Moderate persistent asthma with (acute) exacerbation: Secondary | ICD-10-CM

## 2020-11-04 LAB — CBC WITH DIFFERENTIAL/PLATELET
Abs Immature Granulocytes: 0.01 10*3/uL (ref 0.00–0.07)
Basophils Absolute: 0.1 10*3/uL (ref 0.0–0.1)
Basophils Relative: 1 %
Eosinophils Absolute: 0.4 10*3/uL (ref 0.0–0.5)
Eosinophils Relative: 5 %
HCT: 48.6 % (ref 39.0–52.0)
Hemoglobin: 15.7 g/dL (ref 13.0–17.0)
Immature Granulocytes: 0 %
Lymphocytes Relative: 34 %
Lymphs Abs: 2.5 10*3/uL (ref 0.7–4.0)
MCH: 27.7 pg (ref 26.0–34.0)
MCHC: 32.3 g/dL (ref 30.0–36.0)
MCV: 85.9 fL (ref 80.0–100.0)
Monocytes Absolute: 0.5 10*3/uL (ref 0.1–1.0)
Monocytes Relative: 7 %
Neutro Abs: 4.1 10*3/uL (ref 1.7–7.7)
Neutrophils Relative %: 53 %
Platelets: 303 10*3/uL (ref 150–400)
RBC: 5.66 MIL/uL (ref 4.22–5.81)
RDW: 14 % (ref 11.5–15.5)
WBC: 7.6 10*3/uL (ref 4.0–10.5)
nRBC: 0 % (ref 0.0–0.2)

## 2020-11-04 LAB — BASIC METABOLIC PANEL
Anion gap: 6 (ref 5–15)
BUN: 25 mg/dL — ABNORMAL HIGH (ref 6–20)
CO2: 28 mmol/L (ref 22–32)
Calcium: 8.9 mg/dL (ref 8.9–10.3)
Chloride: 104 mmol/L (ref 98–111)
Creatinine, Ser: 1.27 mg/dL — ABNORMAL HIGH (ref 0.61–1.24)
GFR, Estimated: >60 mL/min (ref >60)
Glucose, Bld: 101 mg/dL — ABNORMAL HIGH (ref 70–99)
Potassium: 4.1 mmol/L (ref 3.5–5.1)
Sodium: 138 mmol/L (ref 135–145)

## 2020-11-04 LAB — TROPONIN I (HIGH SENSITIVITY)
Troponin I (High Sensitivity): 6 ng/L (ref <18)
Troponin I (High Sensitivity): 5 ng/L (ref <18)

## 2020-11-04 MED ORDER — ALBUTEROL SULFATE HFA 108 (90 BASE) MCG/ACT IN AERS
2.0000 | INHALATION_SPRAY | Freq: Once | RESPIRATORY_TRACT | Status: AC
  Administered 2020-11-04: 2 via RESPIRATORY_TRACT
  Filled 2020-11-04: qty 6.7

## 2020-11-04 MED ORDER — DEXAMETHASONE 4 MG PO TABS
10.0000 mg | ORAL_TABLET | Freq: Once | ORAL | Status: AC
  Administered 2020-11-04: 10 mg via ORAL
  Filled 2020-11-04: qty 3

## 2020-11-04 MED ORDER — IPRATROPIUM BROMIDE 0.02 % IN SOLN
0.5000 mg | Freq: Once | RESPIRATORY_TRACT | Status: AC
  Administered 2020-11-04: 0.5 mg via RESPIRATORY_TRACT
  Filled 2020-11-04: qty 2.5

## 2020-11-04 MED ORDER — ALBUTEROL SULFATE (2.5 MG/3ML) 0.083% IN NEBU
5.0000 mg | INHALATION_SOLUTION | Freq: Once | RESPIRATORY_TRACT | Status: AC
  Administered 2020-11-04: 5 mg via RESPIRATORY_TRACT
  Filled 2020-11-04: qty 6

## 2020-11-04 MED ORDER — ALBUTEROL SULFATE HFA 108 (90 BASE) MCG/ACT IN AERS: 1.0000 | INHALATION_SPRAY | Freq: Four times a day (QID) | RESPIRATORY_TRACT | 0 refills | Status: DC | PRN

## 2020-11-04 NOTE — ED Notes (Signed)
X RAY at bedside 

## 2020-11-04 NOTE — ED Provider Notes (Signed)
Emergency Medicine Provider Triage Evaluation Note  Jason Cardenas , a 60 y.o. male  was evaluated in triage.  Pt complains of asthma attack that began today.  Tried to use his inhaler at home but realized he was out.  Usually has trouble with change in weather (rain, etc).  Prior admissions for asthma, no prior intubations.  Review of Systems  Positive: SOB, wheezing Negative: Fever, cough, chest pain  Physical Exam  BP (!) 143/91   Pulse 87   Temp 97.6 F (36.4 C) (Oral)   Resp (!) 24   SpO2 96%   Gen:   Awake, alert, distressed, tripoding, diaphoretic Resp:  Tachypneic, accessory muscle use, talking in short, truncated sentences MSK:   Moves extremities without difficulty  Other:    Medical Decision Making  Medically screening exam initiated at 12:13 AM.  Appropriate orders placed.  Jason Cardenas was informed that the remainder of the evaluation will be completed by another provider, this initial triage assessment does not replace that evaluation, and the importance of remaining in the ED until their evaluation is complete.  Asthma with acute exacerbation.  Will obtain labs, EKG, CXR.  Nebs ordered.    Made acuity 2 and will expedite room assignment.   Jason Hatchet, PA-C 11/04/20 0016    Jason Rhine, MD 11/04/20 [phone]

## 2020-11-04 NOTE — ED Provider Notes (Signed)
[facility] EMERGENCY DEPARTMENT Provider Note   CSN: [id number] Arrival date & time: 11/04/20  0002     History Chief Complaint  Patient presents with   Shortness of Breath    Jason Cardenas is a 60 y.o. male.  The history is provided by the patient.  Shortness of Breath Severity:  Moderate Onset quality:  Gradual Duration:  4 hours Timing:  Constant Progression:  Improving Chronicity:  Recurrent Relieved by: albuterol. Worsened by:  Nothing Associated symptoms: cough   Associated symptoms: no chest pain, no fever and no hemoptysis   Patient with history of asthma presents with shortness of breath.  He reports he ran of his albuterol inhaler.  He reports that approximately 4 hours ago he began having cough/wheezing/shortness of breath.  No chest pain.  No fevers or vomiting. No Hemoptysis.    Past Medical History:  Diagnosis Date   Asthma    Prediabetes     Patient Active Problem List   Diagnosis Date Noted   Asthma due to environmental allergies 12/02/2018   Allergic rhinitis due to allergen 02/27/2018   Severe persistent asthma 11/17/2011   Asthmatic bronchitis with exacerbation 10/02/2011    Past Surgical History:  Procedure Laterality Date   TONSILLECTOMY         Family History  Problem Relation Age of Onset   Diabetes Mother     Social History   Tobacco Use   Smoking status: Never   Smokeless tobacco: Never  Vaping Use   Vaping Use: Never used  Substance Use Topics   Alcohol use: Yes    Comment: socially    Drug use: No    Home Medications Prior to Admission medications   Medication Sig Start Date End Date Taking? Authorizing Provider  albuterol (VENTOLIN HFA) 108 (90 Base) MCG/ACT inhaler Inhale 1-2 puffs into the lungs every 6 (six) hours as needed for wheezing or shortness of breath. 11/04/20  Yes Zadie Rhine, MD  Fluticasone-Salmeterol (ADVAIR DISKUS) 250-50 MCG/DOSE AEPB Inhale 1 puff into the lungs daily.  07/05/19   Little, Ambrose Finland, MD    Allergies    Patient has no known allergies.  Review of Systems   Review of Systems  Constitutional:  Negative for fever.  Respiratory:  Positive for cough and shortness of breath. Negative for hemoptysis.   Cardiovascular:  Negative for chest pain.  All other systems reviewed and are negative.  Physical Exam Updated Vital Signs BP (!) 143/91   Pulse 82   Temp 97.6 F (36.4 C) (Oral)   Resp 16   Ht 1.778 m (5\' 10" )   Wt 81.6 kg   SpO2 98%   BMI 25.83 kg/m   Physical Exam CONSTITUTIONAL: Well developed/well nourished HEAD: Normocephalic/atraumatic EYES: EOMI/PERRL ENMT: Mucous membranes moist NECK: supple no meningeal signs, no JVD SPINE/BACK:entire spine nontender CV: S1/S2 noted, no murmurs/rubs/gallops noted LUNGS: Lungs are clear to auscultation bilaterally, no apparent distress ABDOMEN: soft, nontender, no rebound or guarding, bowel sounds noted throughout abdomen GU:no cva tenderness NEURO: Pt is awake/alert/appropriate, moves all extremitiesx4.  No facial droop.   EXTREMITIES: pulses normal/equal, full ROM, no lower extremity edema SKIN: warm, color normal PSYCH: no abnormalities of mood noted, alert and oriented to situation  ED Results / Procedures / Treatments   Labs (all labs ordered are listed, but only abnormal results are displayed) Labs Reviewed  BASIC METABOLIC PANEL - Abnormal; Notable for the following components:      Result Value  Glucose, Bld 101 (*)    BUN 25 (*)    Creatinine, Ser 1.27 (*)    All other components within normal limits  CBC WITH DIFFERENTIAL/PLATELET  TROPONIN I (HIGH SENSITIVITY)  TROPONIN I (HIGH SENSITIVITY)    EKG EKG Interpretation  Date/Time:  Thursday November 04 2020 00:05:28 EDT Ventricular Rate:  89 PR Interval:  168 QRS Duration: 72 QT Interval:  374 QTC Calculation: 455 R Axis:   84 Text Interpretation: Normal sinus rhythm Normal ECG Interpretation limited secondary  to artifact Confirmed by Zadie Rhine ([id number]) on 11/04/2020 1:06:25 AM  Radiology DG Chest Port 1 View  Result Date: 11/04/2020 CLINICAL DATA:  Shortness of breath EXAM: PORTABLE CHEST 1 VIEW COMPARISON:  02/08/2019 FINDINGS: Cardiac shadow is within normal limits. Lungs are hyperinflated bilaterally. No focal infiltrate or effusion is seen. Nipple shadows are noted. No bony abnormality is seen. IMPRESSION: COPD without acute abnormality. Electronically Signed   By: Alcide Clever M.D.   On: 11/04/2020 00:29    Procedures Procedures   Medications Ordered in ED Medications  dexamethasone (DECADRON) tablet 10 mg (has no administration in time range)  albuterol (VENTOLIN HFA) 108 (90 Base) MCG/ACT inhaler 2 puff (has no administration in time range)  albuterol (PROVENTIL) (2.5 MG/3ML) 0.083% nebulizer solution 5 mg (5 mg Nebulization Given 11/04/20 0020)  ipratropium (ATROVENT) nebulizer solution 0.5 mg (0.5 mg Nebulization Given 11/04/20 0020)    ED Course  I have reviewed the triage vital signs and the nursing notes.  Pertinent labs & imaging results that were available during my care of the patient were reviewed by me and considered in my medical decision making (see chart for details).    MDM Rules/Calculators/A&P                          By the time of my evaluation patient was improved.  He had already received albuterol.  Lungs are clear.  He  will be given a one-time dose of Decadron.  Will give prescription for albuterol. No other acute issues at this time Final Clinical Impression(s) / ED Diagnoses Final diagnoses:  Moderate persistent asthma with exacerbation    Rx / DC Orders ED Discharge Orders          Ordered    albuterol (VENTOLIN HFA) 108 (90 Base) MCG/ACT inhaler  Every 6 hours PRN        11/04/20 0148             Zadie Rhine, MD 11/04/20 0200

## 2020-11-04 NOTE — ED Triage Notes (Signed)
Pt reporting asthma exacerbation that started tonight, pt was out of his inhaler. Pt with labored breathing with accessory muscles and tachypnea in triage. Neb tx given in triage. Denies pain. Hx of asthma.

## 2020-11-29 ENCOUNTER — Other Ambulatory Visit: Payer: Self-pay

## 2020-11-29 ENCOUNTER — Emergency Department (HOSPITAL_COMMUNITY)
Admission: EM | Admit: 2020-11-29 | Discharge: 2020-11-29 | Disposition: A | Discharge status: Home / Self Care | Payer: Commercial Managed Care - HMO | Attending: Emergency Medicine | Admitting: Emergency Medicine

## 2020-11-29 ENCOUNTER — Emergency Department (HOSPITAL_COMMUNITY): Payer: Commercial Managed Care - HMO

## 2020-11-29 DIAGNOSIS — J4541 Moderate persistent asthma with (acute) exacerbation: Secondary | ICD-10-CM | POA: Insufficient documentation

## 2020-11-29 DIAGNOSIS — Z7951 Long term (current) use of inhaled steroids: Secondary | ICD-10-CM | POA: Diagnosis not present

## 2020-11-29 DIAGNOSIS — R0602 Shortness of breath: Secondary | ICD-10-CM | POA: Diagnosis present

## 2020-11-29 LAB — BASIC METABOLIC PANEL
Anion gap: 8 (ref 5–15)
BUN: 13 mg/dL (ref 6–20)
CO2: 26 mmol/L (ref 22–32)
Calcium: 9.4 mg/dL (ref 8.9–10.3)
Chloride: 105 mmol/L (ref 98–111)
Creatinine, Ser: 1.25 mg/dL — ABNORMAL HIGH (ref 0.61–1.24)
GFR, Estimated: >60 mL/min (ref >60)
Glucose, Bld: 91 mg/dL (ref 70–99)
Potassium: 3.9 mmol/L (ref 3.5–5.1)
Sodium: 139 mmol/L (ref 135–145)

## 2020-11-29 LAB — CBC WITH DIFFERENTIAL/PLATELET
Abs Immature Granulocytes: 0.02 10*3/uL (ref 0.00–0.07)
Basophils Absolute: 0.1 10*3/uL (ref 0.0–0.1)
Basophils Relative: 1 %
Eosinophils Absolute: 0.3 10*3/uL (ref 0.0–0.5)
Eosinophils Relative: 3 %
HCT: 51.1 % (ref 39.0–52.0)
Hemoglobin: 16.8 g/dL (ref 13.0–17.0)
Immature Granulocytes: 0 %
Lymphocytes Relative: 21 %
Lymphs Abs: 1.8 10*3/uL (ref 0.7–4.0)
MCH: 28.1 pg (ref 26.0–34.0)
MCHC: 32.9 g/dL (ref 30.0–36.0)
MCV: 85.5 fL (ref 80.0–100.0)
Monocytes Absolute: 0.6 10*3/uL (ref 0.1–1.0)
Monocytes Relative: 7 %
Neutro Abs: 5.6 10*3/uL (ref 1.7–7.7)
Neutrophils Relative %: 68 %
Platelets: 306 10*3/uL (ref 150–400)
RBC: 5.98 MIL/uL — ABNORMAL HIGH (ref 4.22–5.81)
RDW: 14.1 % (ref 11.5–15.5)
WBC: 8.3 10*3/uL (ref 4.0–10.5)
nRBC: 0 % (ref 0.0–0.2)

## 2020-11-29 LAB — TROPONIN I (HIGH SENSITIVITY): Troponin I (High Sensitivity): 6 ng/L (ref <18)

## 2020-11-29 LAB — BRAIN NATRIURETIC PEPTIDE: B Natriuretic Peptide: 139.9 pg/mL — ABNORMAL HIGH (ref 0.0–100.0)

## 2020-11-29 LAB — MAGNESIUM: Magnesium: 2.1 mg/dL (ref 1.7–2.4)

## 2020-11-29 MED ORDER — CETIRIZINE HCL 10 MG PO TABS: 10.0000 mg | ORAL_TABLET | Freq: Every day | ORAL | 0 refills | Status: DC

## 2020-11-29 MED ORDER — PREDNISONE 20 MG PO TABS: 20.0000 mg | ORAL_TABLET | Freq: Every day | ORAL | 0 refills | Status: AC

## 2020-11-29 MED ORDER — FLUTICASONE-SALMETEROL 250-50 MCG/DOSE IN AEPB: 1.0000 | INHALATION_SPRAY | Freq: Every day | RESPIRATORY_TRACT | 1 refills | Status: DC

## 2020-11-29 MED ORDER — METHYLPREDNISOLONE SODIUM SUCC 125 MG IJ SOLR: 125.0000 mg | Freq: Once | INTRAMUSCULAR | Status: DC

## 2020-11-29 MED ORDER — ALBUTEROL SULFATE HFA 108 (90 BASE) MCG/ACT IN AERS: 1.0000 | INHALATION_SPRAY | Freq: Four times a day (QID) | RESPIRATORY_TRACT | 0 refills | Status: DC | PRN

## 2020-11-29 MED ORDER — PREDNISONE 20 MG PO TABS
50.0000 mg | ORAL_TABLET | Freq: Once | ORAL | Status: AC
  Administered 2020-11-29: 50 mg via ORAL
  Filled 2020-11-29: qty 3

## 2020-11-29 MED ORDER — IPRATROPIUM-ALBUTEROL 0.5-2.5 (3) MG/3ML IN SOLN
3.0000 mL | Freq: Once | RESPIRATORY_TRACT | Status: AC
  Administered 2020-11-29: 3 mL via RESPIRATORY_TRACT

## 2020-11-29 MED ORDER — ALBUTEROL SULFATE (2.5 MG/3ML) 0.083% IN NEBU
2.5000 mg | INHALATION_SOLUTION | Freq: Once | RESPIRATORY_TRACT | Status: AC
  Administered 2020-11-29: 2.5 mg via RESPIRATORY_TRACT

## 2020-11-29 NOTE — ED Provider Notes (Signed)
[facility] EMERGENCY DEPARTMENT Provider Note   CSN: [id number] Arrival date & time: 11/29/20  0701     History Chief Complaint  Patient presents with  . Shortness of Breath    Jason Cardenas is a 60 y.o. male.  Patient is a 60 year old male with past medical history of prediabetes and asthma presenting for complaints of shortness of breath.  Patient admits to shortness of breath that has gradually been worsening over the past 2 weeks.  Admits to ED presentation on 11/04/2020 for similar symptoms and discharged home albuterol inhaler.  Patient denies any chest pain. Denies fevers, chills, coughing, or URI symptoms.  Denies nausea, vomiting, diarrhea.  States he has established appointment with PCP next week for asthma management.  Previously using Advair but has been out for the last year. States asthma is worse in the summer months with summer allergens including pollen. Exposed to second hand smoke at home.   The history is provided by the patient.  Shortness of Breath Severity:  Moderate Onset quality:  Gradual Duration:  2 weeks Timing:  Constant Associated symptoms: no abdominal pain, no chest pain, no cough, no ear pain, no fever, no rash, no sore throat and no vomiting       Past Medical History:  Diagnosis Date  . Asthma   . Prediabetes     Patient Active Problem List   Diagnosis Date Noted  . Asthma due to environmental allergies 12/02/2018  . Allergic rhinitis due to allergen 02/27/2018  . Severe persistent asthma 11/17/2011  . Asthmatic bronchitis with exacerbation 10/02/2011    Past Surgical History:  Procedure Laterality Date  . TONSILLECTOMY         Family History  Problem Relation Age of Onset  . Diabetes Mother     Social History   Tobacco Use  . Smoking status: Never  . Smokeless tobacco: Never  Vaping Use  . Vaping Use: Never used  Substance Use Topics  . Alcohol use: Yes    Comment: socially   . Drug use: No     Home Medications Prior to Admission medications   Medication Sig Start Date End Date Taking? Authorizing Provider  albuterol (VENTOLIN HFA) 108 (90 Base) MCG/ACT inhaler Inhale 1-2 puffs into the lungs every 6 (six) hours as needed for wheezing or shortness of breath. 11/29/20  Yes Edwin Dada P, DO  cetirizine (ZYRTEC ALLERGY) 10 MG tablet Take 1 tablet (10 mg total) by mouth daily. 11/29/20  Yes Edwin Dada P, DO  predniSONE (DELTASONE) 20 MG tablet Take 1 tablet (20 mg total) by mouth daily for 5 days. 11/29/20 12/04/20 Yes Edwin Dada P, DO  albuterol (VENTOLIN HFA) 108 (90 Base) MCG/ACT inhaler Inhale 1-2 puffs into the lungs every 6 (six) hours as needed for wheezing or shortness of breath. 11/29/20   Edwin Dada P, DO  Fluticasone-Salmeterol (ADVAIR) 250-50 MCG/DOSE AEPB Inhale 1 puff into the lungs daily. 11/29/20   Franne Forts, DO    Allergies    Patient has no known allergies.  Review of Systems   Review of Systems  Constitutional:  Negative for chills and fever.  HENT:  Negative for ear pain and sore throat.   Eyes:  Negative for pain and visual disturbance.  Respiratory:  Positive for shortness of breath. Negative for cough.   Cardiovascular:  Negative for chest pain and palpitations.  Gastrointestinal:  Negative for abdominal pain and vomiting.  Genitourinary:  Negative for dysuria and hematuria.  Musculoskeletal:  Negative for arthralgias and back pain.  Skin:  Negative for color change and rash.  Neurological:  Negative for seizures and syncope.  All other systems reviewed and are negative.  Physical Exam Updated Vital Signs BP (!) 123/93   Pulse 75   Temp 97.9 F (36.6 C) (Oral)   Resp 20   SpO2 97%   Physical Exam Vitals and nursing note reviewed.  Constitutional:      Appearance: He is well-developed.  HENT:     Head: Normocephalic and atraumatic.  Eyes:     Conjunctiva/sclera: Conjunctivae normal.  Cardiovascular:     Rate and Rhythm: Normal rate and  regular rhythm.     Heart sounds: No murmur heard. Pulmonary:     Effort: Tachypnea and respiratory distress present.     Breath sounds: Decreased air movement present. No wheezing, rhonchi or rales.  Abdominal:     Palpations: Abdomen is soft.     Tenderness: There is no abdominal tenderness.  Musculoskeletal:     Cervical back: Neck supple.  Skin:    General: Skin is warm and dry.  Neurological:     Mental Status: He is alert.    ED Results / Procedures / Treatments   Labs (all labs ordered are listed, but only abnormal results are displayed) Labs Reviewed  BASIC METABOLIC PANEL - Abnormal; Notable for the following components:      Result Value   Creatinine, Ser 1.25 (*)    All other components within normal limits  CBC WITH DIFFERENTIAL/PLATELET - Abnormal; Notable for the following components:   RBC 5.98 (*)    All other components within normal limits  BRAIN NATRIURETIC PEPTIDE - Abnormal; Notable for the following components:   B Natriuretic Peptide 139.9 (*)    All other components within normal limits  MAGNESIUM  TROPONIN I (HIGH SENSITIVITY)    EKG None  Radiology DG Chest Portable 1 View  Result Date: 11/29/2020 CLINICAL DATA:  60 year old male with shortness of breath. Decreased lung sounds. Wheezing. EXAM: PORTABLE CHEST 1 VIEW COMPARISON:  Portable chest 11/04/2020 and earlier. FINDINGS: Portable AP semi upright view at 0750 hours. Lung volumes are stable at the upper limits of normal. Normal cardiac size and mediastinal contours. Visualized tracheal air column is within normal limits. Somewhat attenuated upper lobe bronchovascular markings especially on the right. Otherwise when allowing for portable technique the lungs are clear. No pneumothorax or pleural effusion. No osseous abnormality identified. Negative visible bowel gas pattern. IMPRESSION: Questionable upper lobe emphysema. No acute cardiopulmonary abnormality. Electronically Signed   By: Odessa Fleming M.D.    On: 11/29/2020 07:56    Procedures Procedures   Medications Ordered in ED Medications  ipratropium-albuterol (DUONEB) 0.5-2.5 (3) MG/3ML nebulizer solution 3 mL (3 mLs Nebulization Given 11/29/20 0731)  albuterol (PROVENTIL) (2.5 MG/3ML) 0.083% nebulizer solution 2.5 mg (2.5 mg Nebulization Given 11/29/20 0731)  predniSONE (DELTASONE) tablet 50 mg (50 mg Oral Given 11/29/20 2992)    ED Course  I have reviewed the triage vital signs and the nursing notes.  Pertinent labs & imaging results that were available during my care of the patient were reviewed by me and considered in my medical decision making (see chart for details).    MDM Rules/Calculators/A&P                           60 year old male with past medical history of prediabetes and asthma presenting for complaints of  shortness of breath.  On exam patient is alert and oriented x3, respiratory distress with tachypnea with a rate of 25 bpm, and diminished breath sounds in all lung fields.  No hypoxia.  Duonebulizer treatment started.   On reevaluation patient, patient admits to complete resolution of symptoms at this time.  EKG stable with no ST segment elevation or depression.  Troponin, BNP, and chest x-ray stable.  No pneumothorax.  No pneumonia.  Low suspicion PE.  Symptoms likely secondary to asthma exacerbation.  Patient discharged home with Zyrtec for seasonal allergies, albuterol, and prednisone.  Recommendations for close follow-up with PCP for further management of asthma.  Return precautions urgency department if shortness of breath worsens in any way.  Agreeable to plan.  All questions answered prior to discharge.     Final Clinical Impression(s) / ED Diagnoses Final diagnoses:  Moderate persistent asthma with exacerbation    Rx / DC Orders ED Discharge Orders          Ordered    albuterol (VENTOLIN HFA) 108 (90 Base) MCG/ACT inhaler  Every 6 hours PRN        11/29/20 0954    predniSONE (DELTASONE) 20 MG tablet   Daily        11/29/20 0954    cetirizine (ZYRTEC ALLERGY) 10 MG tablet  Daily        11/29/20 0954    albuterol (VENTOLIN HFA) 108 (90 Base) MCG/ACT inhaler  Every 6 hours PRN        11/29/20 0954    Fluticasone-Salmeterol (ADVAIR) 250-50 MCG/DOSE AEPB  Daily        11/29/20 0954             Franne Forts, DO 11/29/20 1608

## 2020-11-29 NOTE — ED Notes (Addendum)
RT notified, neb started. Reports asthma triggered by weather.

## 2020-11-29 NOTE — ED Notes (Addendum)
Calmer, breathing easier, "feels better". Attempted IV x3, unsuccessful.

## 2020-11-29 NOTE — ED Notes (Signed)
EDPA into room 

## 2020-11-29 NOTE — ED Triage Notes (Signed)
Pt here POV with c/o of SOB with Hx of asthma. Pt labored, lungs sounds diminished with wheezing.

## 2021-01-03 ENCOUNTER — Encounter: Payer: Self-pay | Admitting: Nurse Practitioner

## 2021-01-22 IMAGING — CR CHEST - 2 VIEW
2 series · 2 of 2 positions shown · non-contrast
Comparison: Chest radiograph dated 07/28/2018

CLINICAL DATA: 57-year-old male with shortness of breath

EXAM:
CHEST - 2 VIEW

[w chest pa]
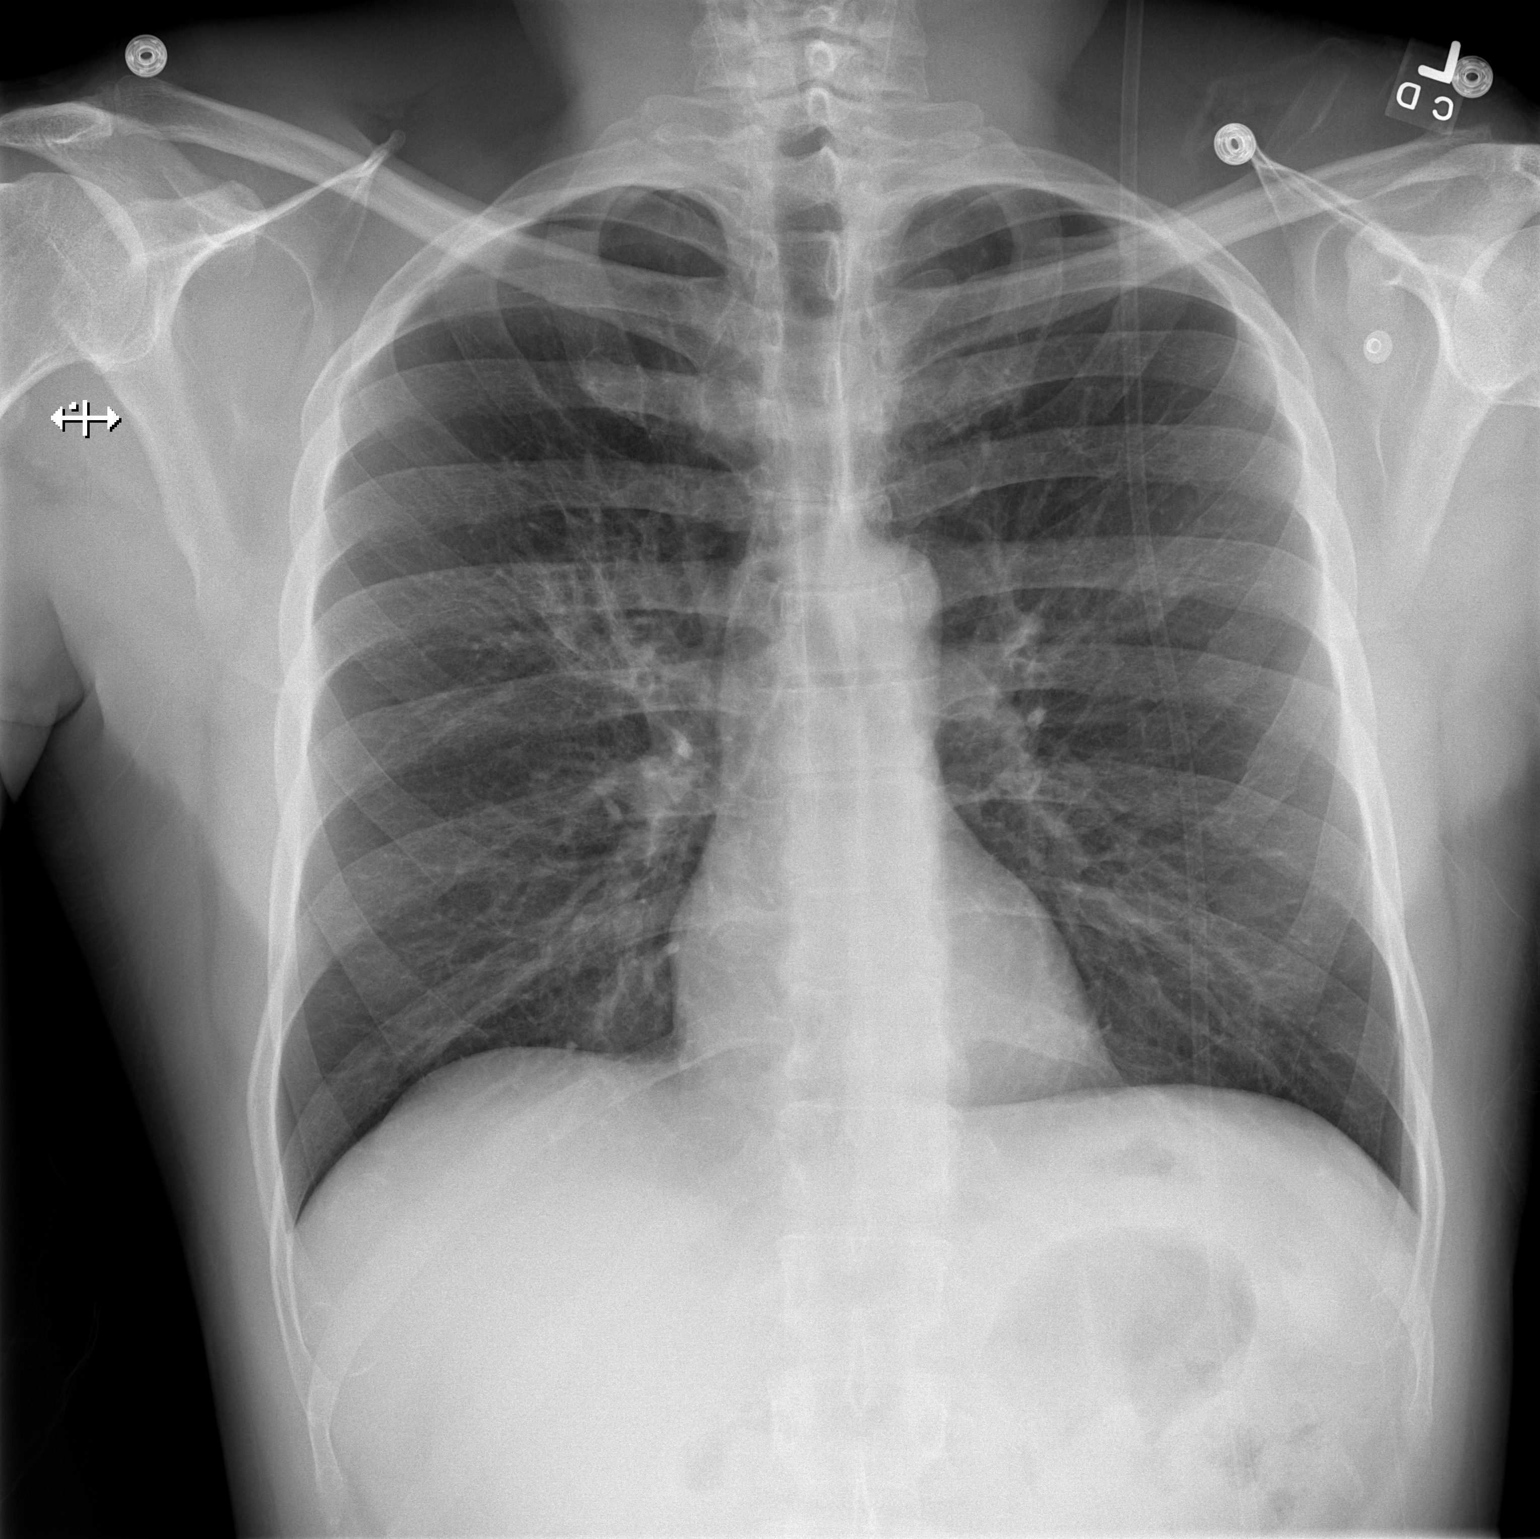

[w chest lat]
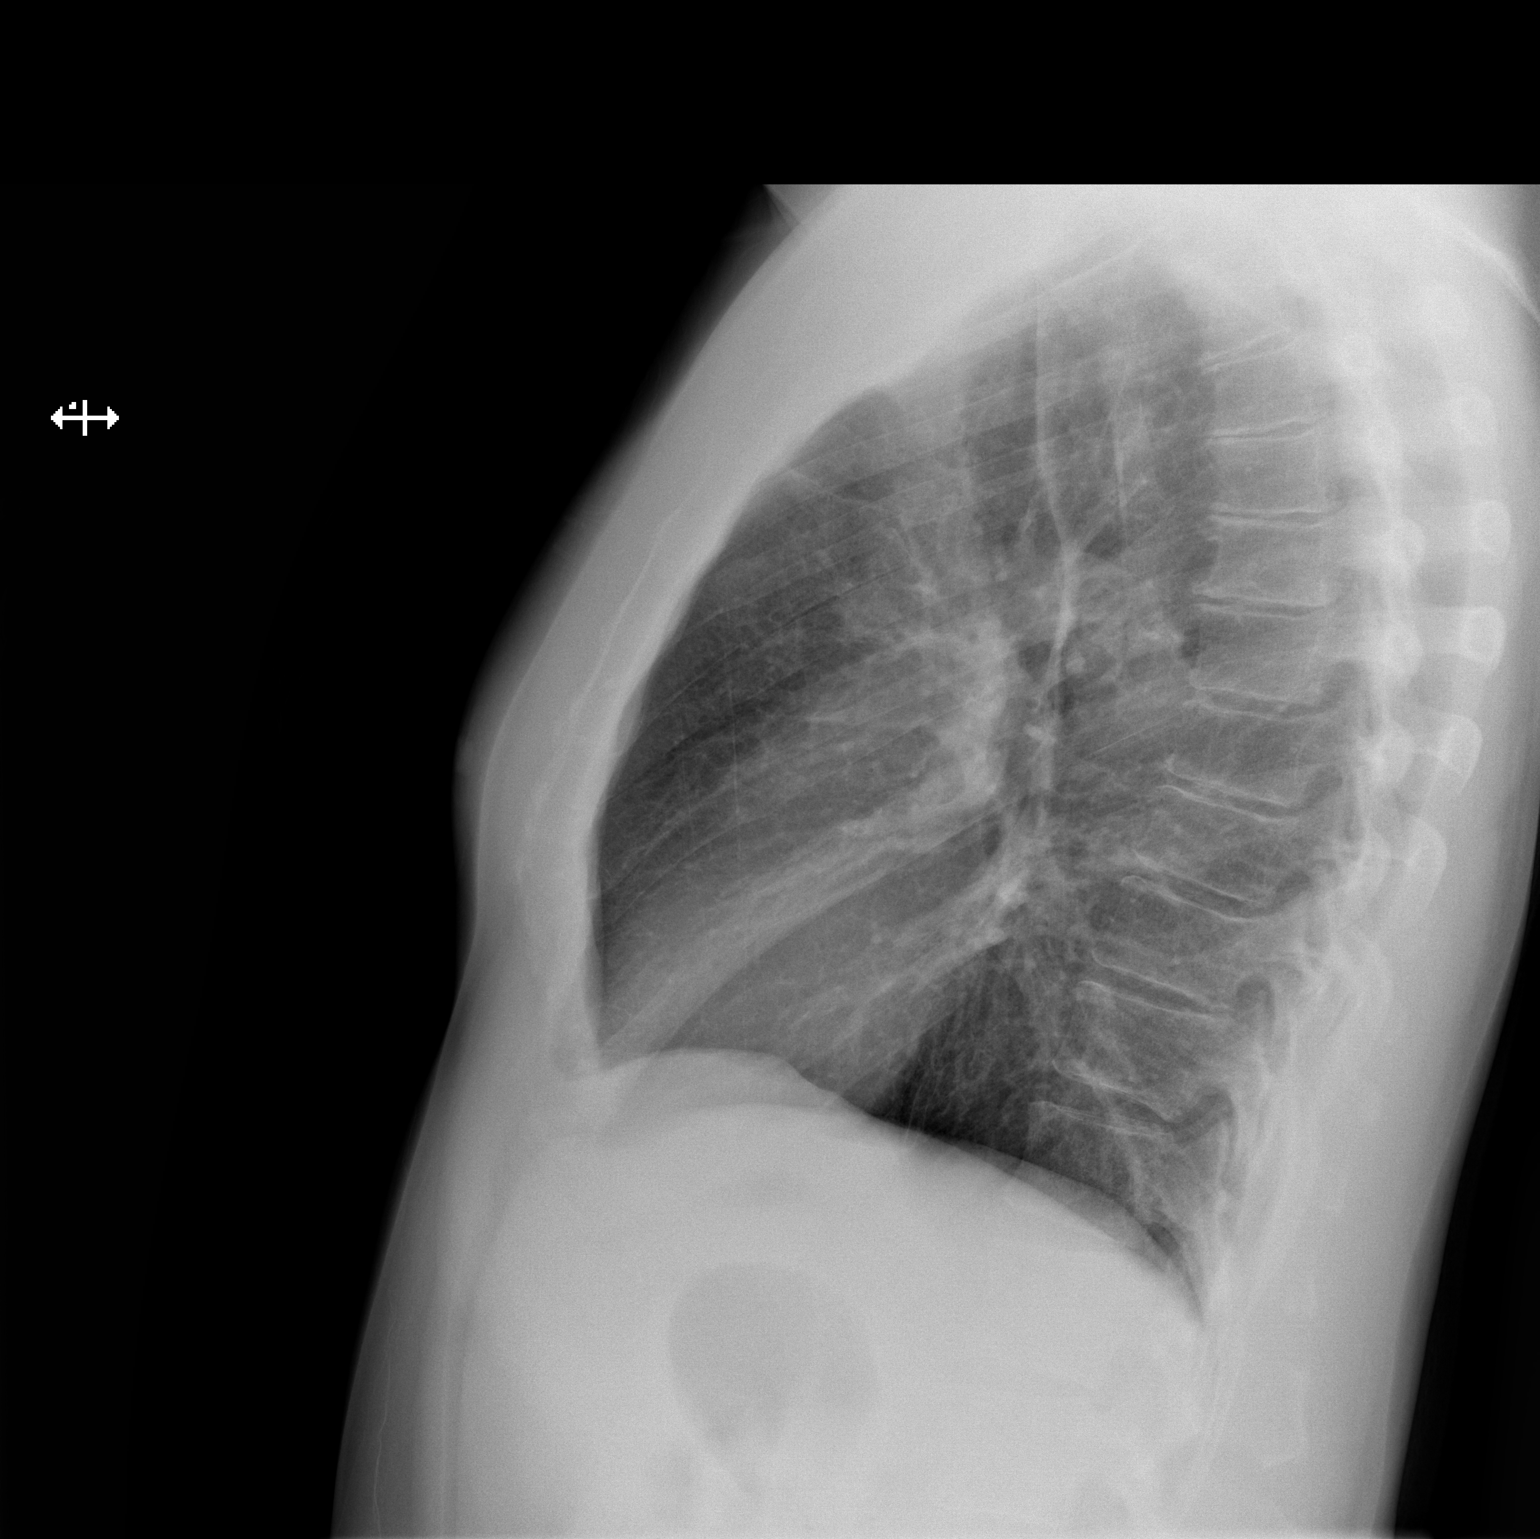

[2 of 2 positions shown; findings below may reference images not displayed]

FINDINGS: The lungs are clear. There is no pleural effusion or pneumothorax.
The cardiac silhouette is within normal limits. No acute osseous
pathology. Partially visualized tubing over the left chest may
represent VP shunt. Clinical correlation is recommended.
IMPRESSION: No active cardiopulmonary disease.

## 2021-02-08 ENCOUNTER — Encounter: Payer: Self-pay | Admitting: Internal Medicine

## 2021-02-11 ENCOUNTER — Encounter: Payer: Self-pay | Admitting: Internal Medicine

## 2021-07-16 ENCOUNTER — Other Ambulatory Visit: Payer: Self-pay

## 2021-07-16 ENCOUNTER — Emergency Department (HOSPITAL_COMMUNITY): Payer: Self-pay

## 2021-07-16 ENCOUNTER — Encounter (HOSPITAL_COMMUNITY): Payer: Self-pay | Admitting: Emergency Medicine

## 2021-07-16 ENCOUNTER — Emergency Department (HOSPITAL_COMMUNITY)
Admission: EM | Admit: 2021-07-16 | Discharge: 2021-07-16 | Disposition: A | Discharge status: Home / Self Care | Payer: Self-pay | Attending: Emergency Medicine | Admitting: Emergency Medicine

## 2021-07-16 DIAGNOSIS — J4551 Severe persistent asthma with (acute) exacerbation: Secondary | ICD-10-CM

## 2021-07-16 DIAGNOSIS — Z20822 Contact with and (suspected) exposure to covid-19: Secondary | ICD-10-CM | POA: Insufficient documentation

## 2021-07-16 DIAGNOSIS — J45909 Unspecified asthma, uncomplicated: Secondary | ICD-10-CM | POA: Insufficient documentation

## 2021-07-16 DIAGNOSIS — Z7951 Long term (current) use of inhaled steroids: Secondary | ICD-10-CM | POA: Insufficient documentation

## 2021-07-16 DIAGNOSIS — R0602 Shortness of breath: Secondary | ICD-10-CM | POA: Insufficient documentation

## 2021-07-16 LAB — BASIC METABOLIC PANEL
Anion gap: 6 (ref 5–15)
BUN: 15 mg/dL (ref 6–20)
CO2: 25 mmol/L (ref 22–32)
Calcium: 9 mg/dL (ref 8.9–10.3)
Chloride: 108 mmol/L (ref 98–111)
Creatinine, Ser: 1.04 mg/dL (ref 0.61–1.24)
GFR, Estimated: >60 mL/min (ref >60)
Glucose, Bld: 100 mg/dL — ABNORMAL HIGH (ref 70–99)
Potassium: 3.7 mmol/L (ref 3.5–5.1)
Sodium: 139 mmol/L (ref 135–145)

## 2021-07-16 LAB — CBC WITH DIFFERENTIAL/PLATELET
Abs Immature Granulocytes: 0.01 10*3/uL (ref 0.00–0.07)
Basophils Absolute: 0 10*3/uL (ref 0.0–0.1)
Basophils Relative: 0 %
Eosinophils Absolute: 0.4 10*3/uL (ref 0.0–0.5)
Eosinophils Relative: 4 %
HCT: 47.3 % (ref 39.0–52.0)
Hemoglobin: 15.6 g/dL (ref 13.0–17.0)
Immature Granulocytes: 0 %
Lymphocytes Relative: 21 %
Lymphs Abs: 2.2 10*3/uL (ref 0.7–4.0)
MCH: 27.9 pg (ref 26.0–34.0)
MCHC: 33 g/dL (ref 30.0–36.0)
MCV: 84.5 fL (ref 80.0–100.0)
Monocytes Absolute: 0.6 10*3/uL (ref 0.1–1.0)
Monocytes Relative: 6 %
Neutro Abs: 7.1 10*3/uL (ref 1.7–7.7)
Neutrophils Relative %: 69 %
Platelets: 352 10*3/uL (ref 150–400)
RBC: 5.6 MIL/uL (ref 4.22–5.81)
RDW: 14.4 % (ref 11.5–15.5)
WBC: 10.3 10*3/uL (ref 4.0–10.5)
nRBC: 0 % (ref 0.0–0.2)

## 2021-07-16 LAB — RESP PANEL BY RT-PCR (FLU A&B, COVID) ARPGX2
Influenza A by PCR: NEGATIVE
Influenza B by PCR: NEGATIVE
SARS Coronavirus 2 by RT PCR: NEGATIVE

## 2021-07-16 MED ORDER — METHYLPREDNISOLONE SODIUM SUCC 125 MG IJ SOLR
125.0000 mg | Freq: Once | INTRAMUSCULAR | Status: AC
  Administered 2021-07-16: 125 mg via INTRAVENOUS
  Filled 2021-07-16: qty 2

## 2021-07-16 MED ORDER — IPRATROPIUM BROMIDE 0.02 % IN SOLN
1.0000 mg | Freq: Once | RESPIRATORY_TRACT | Status: AC
  Administered 2021-07-16: 1 mg via RESPIRATORY_TRACT
  Filled 2021-07-16: qty 5

## 2021-07-16 MED ORDER — ALBUTEROL SULFATE HFA 108 (90 BASE) MCG/ACT IN AERS
2.0000 | INHALATION_SPRAY | Freq: Once | RESPIRATORY_TRACT | Status: AC
  Administered 2021-07-16: 2 via RESPIRATORY_TRACT
  Filled 2021-07-16: qty 6.7

## 2021-07-16 MED ORDER — METHYLPREDNISOLONE 4 MG PO TBPK: ORAL_TABLET | ORAL | 0 refills | Status: DC

## 2021-07-16 MED ORDER — MAGNESIUM SULFATE 2 GM/50ML IV SOLN
2.0000 g | Freq: Once | INTRAVENOUS | Status: AC
  Administered 2021-07-16: 2 g via INTRAVENOUS
  Filled 2021-07-16: qty 50

## 2021-07-16 MED ORDER — ALBUTEROL (5 MG/ML) CONTINUOUS INHALATION SOLN
10.0000 mg/h | INHALATION_SOLUTION | Freq: Once | RESPIRATORY_TRACT | Status: AC
  Administered 2021-07-16: 10 mg/h via RESPIRATORY_TRACT
  Filled 2021-07-16: qty 0.5

## 2021-07-16 NOTE — Discharge Instructions (Signed)
Use Xyzal otc allergy medicine at night before bed. ? ?Use your inhaler 1-2 puffs every 4 hours as needed for wheezing and sob. ? ? ?Get help right away if: ?Your peak flow reading is less than 50% of your personal best. This is in the red zone, which means "danger." ?You have trouble breathing. ?You develop chest pain or discomfort. ?Your medicines no longer seem to be helping. ?You are coughing up bloody mucus. ?You have a fever and your symptoms suddenly get worse. ?You have trouble swallowing. ?You feel very tired, and breathing becomes tiring. ?

## 2021-07-16 NOTE — ED Triage Notes (Signed)
Pt c/o worsening shortness of breath and chest tightness x 1 day. Hx asthma, states no relief with home inhalers. Denies cough/fever. ?

## 2021-07-16 NOTE — ED Notes (Incomplete)
Pt provided discharge instructions and prescription information. Pt was given the opportunity to ask questions and questions were answered. Discharge signature not obtained in the setting of the COVID-19 pandemic in order to reduce high touch surfaces.  ° °

## 2021-07-16 NOTE — ED Provider Notes (Signed)
?[facility] ?Provider Note ? ? ?CSN: [id number] ?Arrival date & time: 07/16/21  1500 ? ?  ? ?History ? ?Chief Complaint  ?Patient presents with  ? Shortness of Breath  ? ? ?Jason Cardenas is a 61 y.o. male with a past medical history of chronic persistent asthma and seasonal allergies.  He presents to the emergency department with complaint of asthma exacerbation and shortness of breath.  Patient states that he was out of his albuterol inhaler.  He tends to have an increase in his asthma attacks during the spring when his seasonal allergies are worse.  He has had some increased wheezing and shortness of breath over the past couple days but for the past hour and a half has had fairly severe difficulty breathing and moving air.  He denies a history of tobacco abuse or smoking.  He denies recent upper respiratory infection, fevers, chills.  He denies chest pain. ? ? ?Shortness of Breath ? ?  ? ?Home Medications ?Prior to Admission medications   ?Medication Sig Start Date End Date Taking? Authorizing Provider  ?albuterol (VENTOLIN HFA) 108 (90 Base) MCG/ACT inhaler Inhale 1-2 puffs into the lungs every 6 (six) hours as needed for wheezing or shortness of breath. [id number]   Lianne Cure, DO  ?albuterol (VENTOLIN HFA) 108 (90 Base) MCG/ACT inhaler Inhale 1-2 puffs into the lungs every 6 (six) hours as needed for wheezing or shortness of breath. [id number]   Lianne Cure, DO  ?cetirizine (ZYRTEC ALLERGY) 10 MG tablet Take 1 tablet (10 mg total) by mouth daily. [id number]   Lianne Cure, DO  ?Fluticasone-Salmeterol (ADVAIR) 250-50 MCG/DOSE AEPB Inhale 1 puff into the lungs daily. [id number]   Lianne Cure, DO  ?   ? ?Allergies    ?Patient has no known allergies.   ? ?Review of Systems   ?Review of Systems  ?Respiratory:  Positive for shortness of breath.   ? ?Physical Exam ?Updated Vital Signs ?BP (!) 152/85   Pulse (!) 102   Temp 98.1 ?F (36.7 ?C) (Oral)   Resp (!) 30   Ht 5\' 10"   (1.778 m)   Wt 81.6 kg   SpO2 97%   BMI 25.83 kg/m?  ?Physical Exam ?Vitals and nursing note reviewed.  ?Constitutional:   ?   General: He is not in acute distress. ?   Appearance: He is well-developed. He is not diaphoretic.  ?HENT:  ?   Head: Normocephalic and atraumatic.  ?Eyes:  ?   General: No scleral icterus. ?   Conjunctiva/sclera: Conjunctivae normal.  ?Cardiovascular:  ?   Rate and Rhythm: Normal rate and regular rhythm.  ?   Heart sounds: Normal heart sounds.  ?Pulmonary:  ?   Effort: Tachypnea and accessory muscle usage present.  ?   Breath sounds: Decreased air movement present. Decreased breath sounds present.  ?   Comments: Patient sitting upright in the examining bed.  He has his shoulders and near tripod position and is sipping ear.  He is able to speak in full sentences.  Minimal air movement noted.  No obvious wheezing on auscultation. ?No peripheral edema or JVD ?Abdominal:  ?   Palpations: Abdomen is soft.  ?   Tenderness: There is no abdominal tenderness.  ?Musculoskeletal:  ?   Cervical back: Normal range of motion and neck supple.  ?Skin: ?   General: Skin is warm and dry.  ?Neurological:  ?   Mental Status: He is alert.  ?  Psychiatric:     ?   Behavior: Behavior normal.  ? ? ?ED Results / Procedures / Treatments   ?Labs ?(all labs ordered are listed, but only abnormal results are displayed) ?Labs Reviewed  ?RESP PANEL BY RT-PCR (FLU A&B, COVID) [id number]  ?BASIC METABOLIC PANEL  ?CBC WITH DIFFERENTIAL/PLATELET  ? ? ?EKG ?EKG Interpretation ? ?Date/Time:  Saturday July 16 2021 15:09:23 EDT ?Ventricular Rate:  104 ?PR Interval:  154 ?QRS Duration: 106 ?QT Interval:  359 ?QTC Calculation: 473 ?R Axis:   93 ?Text Interpretation: Sinus tachycardia Biatrial enlargement Borderline right axis deviation Probable anteroseptal infarct, old Borderline repolarization abnormality similar to Aug 2022 Confirmed by Sherwood Gambler [phone]) on 07/16/2021 3:14:45 PM ? ?Radiology ?No results  found. ? ?Procedures ?Procedures  ? ? ?Medications Ordered in ED ?Medications  ?albuterol (PROVENTIL,VENTOLIN) solution continuous neb (has no administration in time range)  ?magnesium sulfate IVPB 2 g 50 mL (has no administration in time range)  ?methylPREDNISolone sodium succinate (SOLU-MEDROL) 125 mg/2 mL injection 125 mg (has no administration in time range)  ?ipratropium (ATROVENT) nebulizer solution 1 mg (has no administration in time range)  ? ? ?ED Course/ Medical Decision Making/ A&P ?Clinical Course as of 07/16/21 1918  ?Sat Jul 16, 2021  ?1641 Patient reevaluated- mildly improved air movement.- still labored ? [AH]  ?[id number] Basic metabolic panel(!) [AH]  ?1740 Resp Panel by RT-PCR (Flu A&B, Covid) Nasopharyngeal Swab [AH]  ?1740 CBC with Differential/Platelet [AH]  ?[place] 1 View ?I visualized CXR- + air trapping suggestive of ASTHMA - agree with rads [AH]  ?1741 EKG 12-Lead [AH]  ?  ?Clinical Course User Index ?[AH] Margarita Mail, PA-C  ? ?                        ?Medical Decision Making ?Patient here with c/o SOB. ?The emergent differential diagnosis for shortness of breath includes, but is not limited to, Pulmonary edema, bronchoconstriction, Pneumonia, Pulmonary embolism, Pneumotherax/ Hemothorax, Dysrythmia, ACS.  ? ?e to review all data points, imaging and lab values his diagnosis is most consistent with acute asthma exacerbation.  Patient was given an hour-long nebulizer treatment, IV steroids, fluids, magnesium and close monitoring with significant improvement in his symptoms.  On reevaluation patient has good lung sounds in all fields and is moving air well.  He feels much better.  He is no longer tachypneic with a respiratory rate in the 20s.  Patient will be given a refill on his MDI albuterol inhaler and steroid taper.  He is encouraged to take over-the-counter allergy medications.  Appears appropriate for discharge at this ? ?Amount and/or Complexity of Data Reviewed ?Labs:  ordered. Decision-making details documented in ED Course. ?Radiology: ordered. Decision-making details documented in ED Course. ?ECG/medicine tests:  Decision-making details documented in ED Course. ? ?Risk ?Prescription drug management. ? ? ? ? ? ?  ? ? ? ? ?Final Clinical Impression(s) / ED Diagnoses ?Final diagnoses:  ?None  ? ? ?Rx / DC Orders ?ED Discharge Orders   ? ? None  ? ?  ? ? ?  ?Margarita Mail, PA-C ?07/16/21 1932 ? ?  ?Sherwood Gambler, MD ?07/17/21 1546 ? ?

## 2021-10-15 ENCOUNTER — Other Ambulatory Visit: Payer: Self-pay

## 2021-10-15 ENCOUNTER — Encounter (HOSPITAL_COMMUNITY): Payer: Self-pay

## 2021-10-15 ENCOUNTER — Emergency Department (HOSPITAL_COMMUNITY)
Admission: EM | Admit: 2021-10-15 | Discharge: 2021-10-15 | Disposition: A | Discharge status: Home / Self Care | Payer: Commercial Managed Care - HMO | Attending: Emergency Medicine | Admitting: Emergency Medicine

## 2021-10-15 DIAGNOSIS — J45901 Unspecified asthma with (acute) exacerbation: Secondary | ICD-10-CM

## 2021-10-15 MED ORDER — ALBUTEROL SULFATE HFA 108 (90 BASE) MCG/ACT IN AERS
2.0000 | INHALATION_SPRAY | RESPIRATORY_TRACT | Status: DC | PRN
  Filled 2021-10-15: qty 6.7

## 2021-10-15 MED ORDER — METHYLPREDNISOLONE SODIUM SUCC 125 MG IJ SOLR
125.0000 mg | Freq: Once | INTRAMUSCULAR | Status: AC
  Administered 2021-10-15: 125 mg via INTRAMUSCULAR
  Filled 2021-10-15: qty 2

## 2021-10-15 MED ORDER — IPRATROPIUM-ALBUTEROL 0.5-2.5 (3) MG/3ML IN SOLN
3.0000 mL | Freq: Once | RESPIRATORY_TRACT | Status: AC
  Administered 2021-10-15: 3 mL via RESPIRATORY_TRACT
  Filled 2021-10-15: qty 3

## 2021-10-15 MED ORDER — PREDNISONE 20 MG PO TABS: 40.0000 mg | ORAL_TABLET | Freq: Every day | ORAL | 0 refills | Status: DC

## 2021-10-15 NOTE — ED Provider Notes (Signed)
[facility] Emergency Department Provider Note MRN:  [id number]  Arrival date & time: 10/15/21     Chief Complaint   Asthma   History of Present Illness   Jason Cardenas is a 61 y.o. year-old male presents to the ED with chief complaint of asthma attack that started earlier today.  States that he thinks it was exacerbated by being out in the heat today.  Was reportedly wheezing and tachypneic in triage and was given breathing treatment and Solu-Medrol.  Patient now states that he is feeling significantly improved.  Denies any fever, chills, cough.  History provided by patient.   Review of Systems  Pertinent review of systems noted in HPI.    Physical Exam   Vitals:   10/15/21 2209 10/15/21 2219  BP:    Pulse:  79  Resp:  20  Temp: 98.4 F (36.9 C) 97.8 F (36.6 C)  SpO2:  98%    CONSTITUTIONAL:  well-appearing, NAD NEURO:  Alert and oriented x 3, CN 3-12 grossly intact EYES:  eyes equal and reactive ENT/NECK:  Supple, no stridor  CARDIO:  normal rate, regular rhythm, appears well-perfused  PULM:  No respiratory distress, CTAB on my exam after neb GI/GU:  non-distended,  MSK/SPINE:  No gross deformities, no edema, moves all extremities  SKIN:  no rash, atraumatic   *Additional and/or pertinent findings included in MDM below  Diagnostic and Interventional Summary    EKG Interpretation  Date/Time:    Ventricular Rate:    PR Interval:    QRS Duration:   QT Interval:    QTC Calculation:   R Axis:     Text Interpretation:         Labs Reviewed - No data to display  No orders to display    Medications  albuterol (VENTOLIN HFA) 108 (90 Base) MCG/ACT inhaler 2 puff (has no administration in time range)  ipratropium-albuterol (DUONEB) 0.5-2.5 (3) MG/3ML nebulizer solution 3 mL (3 mLs Nebulization Given 10/15/21 2147)  methylPREDNISolone sodium succinate (SOLU-MEDROL) 125 mg/2 mL injection 125 mg (125 mg Intramuscular Given 10/15/21  2147)     Procedures  /  Critical Care Procedures  ED Course and Medical Decision Making  I have reviewed the triage vital signs, the nursing notes, and pertinent available records from the EMR.  Social Determinants Affecting Complexity of Care: Patient has no clinically significant social determinants affecting this chief complaint..   ED Course:   Patient here with wheezing.  Top differential diagnoses include asthma exacerbation, allergic reaction. Medical Decision Making Patient here with asthma exacerbation.  Treated in triage with albuterol nebulizer with significant improvement.  Was also given steroids.  Patient states he feels much better and would like to be discharged.  Sent home with prednisone and an inhaler.  Risk Prescription drug management.     Consultants: No consultations were needed in caring for this patient.   Treatment and Plan: Emergency department workup does not suggest an emergent condition requiring admission or immediate intervention beyond  what has been performed at this time. The patient is safe for discharge and has  been instructed to return immediately for worsening symptoms, change in  symptoms or any other concerns    Final Clinical Impressions(s) / ED Diagnoses     ICD-10-CM   1. Exacerbation of asthma, unspecified asthma severity, unspecified whether persistent  J45.901       ED Discharge Orders          Ordered  predniSONE (DELTASONE) 20 MG tablet  Daily        10/15/21 2215              Discharge Instructions Discussed with and Provided to Patient:   Discharge Instructions   None      Roxy Horseman, Cordelia Poche 10/15/21 2311    Cathren Laine, MD 10/15/21 2329

## 2021-10-15 NOTE — ED Triage Notes (Addendum)
Sts an asthma attack beginning 1 hr pta. Used albuterol inhaler with no improvement.

## 2022-01-28 ENCOUNTER — Observation Stay (HOSPITAL_COMMUNITY)
Admission: EM | Admit: 2022-01-28 | Discharge: 2022-01-29 | Disposition: A | Discharge status: Home / Self Care | Payer: Self-pay | Attending: Internal Medicine | Admitting: Internal Medicine

## 2022-01-28 ENCOUNTER — Encounter (HOSPITAL_COMMUNITY): Payer: Self-pay | Admitting: Emergency Medicine

## 2022-01-28 ENCOUNTER — Emergency Department (HOSPITAL_COMMUNITY): Payer: Self-pay

## 2022-01-28 ENCOUNTER — Other Ambulatory Visit: Payer: Self-pay

## 2022-01-28 DIAGNOSIS — Z1152 Encounter for screening for COVID-19: Secondary | ICD-10-CM | POA: Insufficient documentation

## 2022-01-28 DIAGNOSIS — J9601 Acute respiratory failure with hypoxia: Secondary | ICD-10-CM | POA: Insufficient documentation

## 2022-01-28 DIAGNOSIS — J45901 Unspecified asthma with (acute) exacerbation: Secondary | ICD-10-CM

## 2022-01-28 DIAGNOSIS — Z7951 Long term (current) use of inhaled steroids: Secondary | ICD-10-CM | POA: Insufficient documentation

## 2022-01-28 DIAGNOSIS — J45909 Unspecified asthma, uncomplicated: Secondary | ICD-10-CM | POA: Diagnosis present

## 2022-01-28 DIAGNOSIS — J4542 Moderate persistent asthma with status asthmaticus: Secondary | ICD-10-CM

## 2022-01-28 DIAGNOSIS — J4552 Severe persistent asthma with status asthmaticus: Secondary | ICD-10-CM | POA: Insufficient documentation

## 2022-01-28 LAB — I-STAT VENOUS BLOOD GAS, ED
Acid-base deficit: 1 mmol/L (ref 0.0–2.0)
Bicarbonate: 23.6 mmol/L (ref 20.0–28.0)
Calcium, Ion: 1.08 mmol/L — ABNORMAL LOW (ref 1.15–1.40)
HCT: 49 % (ref 39.0–52.0)
Hemoglobin: 16.7 g/dL (ref 13.0–17.0)
O2 Saturation: 44 %
Potassium: 4 mmol/L (ref 3.5–5.1)
Sodium: 140 mmol/L (ref 135–145)
TCO2: 25 mmol/L (ref 22–32)
pCO2, Ven: 38.1 mmHg — ABNORMAL LOW (ref 44–60)
pH, Ven: 7.4 (ref 7.25–7.43)
pO2, Ven: 24 mmHg — CL (ref 32–45)

## 2022-01-28 LAB — CBC WITH DIFFERENTIAL/PLATELET
Abs Immature Granulocytes: 0.04 10*3/uL (ref 0.00–0.07)
Basophils Absolute: 0.1 10*3/uL (ref 0.0–0.1)
Basophils Relative: 1 %
Eosinophils Absolute: 0.3 10*3/uL (ref 0.0–0.5)
Eosinophils Relative: 3 %
HCT: 48.2 % (ref 39.0–52.0)
Hemoglobin: 16.1 g/dL (ref 13.0–17.0)
Immature Granulocytes: 0 %
Lymphocytes Relative: 15 %
Lymphs Abs: 1.6 10*3/uL (ref 0.7–4.0)
MCH: 28.7 pg (ref 26.0–34.0)
MCHC: 33.4 g/dL (ref 30.0–36.0)
MCV: 85.9 fL (ref 80.0–100.0)
Monocytes Absolute: 0.4 10*3/uL (ref 0.1–1.0)
Monocytes Relative: 4 %
Neutro Abs: 8.5 10*3/uL — ABNORMAL HIGH (ref 1.7–7.7)
Neutrophils Relative %: 77 %
Platelets: 298 10*3/uL (ref 150–400)
RBC: 5.61 MIL/uL (ref 4.22–5.81)
RDW: 14.3 % (ref 11.5–15.5)
WBC: 10.9 10*3/uL — ABNORMAL HIGH (ref 4.0–10.5)
nRBC: 0 % (ref 0.0–0.2)

## 2022-01-28 LAB — HIV ANTIBODY (ROUTINE TESTING W REFLEX): HIV Screen 4th Generation wRfx: NONREACTIVE

## 2022-01-28 LAB — BLOOD GAS, VENOUS
Acid-base deficit: 0.6 mmol/L (ref 0.0–2.0)
Bicarbonate: 23.2 mmol/L (ref 20.0–28.0)
Drawn by: 164
O2 Saturation: 95.2 %
Patient temperature: 37
pCO2, Ven: 35 mmHg — ABNORMAL LOW (ref 44–60)
pH, Ven: 7.43 (ref 7.25–7.43)
pO2, Ven: 67 mmHg — ABNORMAL HIGH (ref 32–45)

## 2022-01-28 LAB — BASIC METABOLIC PANEL
Anion gap: 8 (ref 5–15)
BUN: 17 mg/dL (ref 6–20)
CO2: 26 mmol/L (ref 22–32)
Calcium: 9.2 mg/dL (ref 8.9–10.3)
Chloride: 105 mmol/L (ref 98–111)
Creatinine, Ser: 1.18 mg/dL (ref 0.61–1.24)
GFR, Estimated: >60 mL/min (ref >60)
Glucose, Bld: 123 mg/dL — ABNORMAL HIGH (ref 70–99)
Potassium: 4.1 mmol/L (ref 3.5–5.1)
Sodium: 139 mmol/L (ref 135–145)

## 2022-01-28 LAB — BRAIN NATRIURETIC PEPTIDE: B Natriuretic Peptide: 11.3 pg/mL (ref 0.0–100.0)

## 2022-01-28 LAB — SARS CORONAVIRUS 2 BY RT PCR: SARS Coronavirus 2 by RT PCR: NEGATIVE

## 2022-01-28 LAB — D-DIMER, QUANTITATIVE: D-Dimer, Quant: 0.34 ug/mL-FEU (ref 0.00–0.50)

## 2022-01-28 MED ORDER — ALBUTEROL SULFATE (2.5 MG/3ML) 0.083% IN NEBU
10.0000 mg/h | INHALATION_SOLUTION | Freq: Once | RESPIRATORY_TRACT | Status: AC
  Administered 2022-01-28: 10 mg/h via RESPIRATORY_TRACT
  Filled 2022-01-28: qty 3

## 2022-01-28 MED ORDER — METHYLPREDNISOLONE SODIUM SUCC 125 MG IJ SOLR
120.0000 mg | INTRAMUSCULAR | Status: DC
  Administered 2022-01-28: 120 mg via INTRAVENOUS
  Filled 2022-01-28: qty 2

## 2022-01-28 MED ORDER — IPRATROPIUM-ALBUTEROL 0.5-2.5 (3) MG/3ML IN SOLN
3.0000 mL | Freq: Four times a day (QID) | RESPIRATORY_TRACT | Status: DC
  Administered 2022-01-28 – 2022-01-29 (×3): 3 mL via RESPIRATORY_TRACT
  Filled 2022-01-28 (×3): qty 3

## 2022-01-28 MED ORDER — LORATADINE 10 MG PO TABS
10.0000 mg | ORAL_TABLET | Freq: Every day | ORAL | Status: DC
  Administered 2022-01-28 – 2022-01-29 (×2): 10 mg via ORAL
  Filled 2022-01-28 (×2): qty 1

## 2022-01-28 MED ORDER — ALBUTEROL SULFATE (2.5 MG/3ML) 0.083% IN NEBU
5.0000 mg | INHALATION_SOLUTION | Freq: Once | RESPIRATORY_TRACT | Status: AC
  Administered 2022-01-28: 5 mg via RESPIRATORY_TRACT
  Filled 2022-01-28: qty 6

## 2022-01-28 MED ORDER — MAGNESIUM SULFATE 2 GM/50ML IV SOLN
2.0000 g | Freq: Once | INTRAVENOUS | Status: AC
  Administered 2022-01-28: 2 g via INTRAVENOUS
  Filled 2022-01-28: qty 50

## 2022-01-28 MED ORDER — ALBUTEROL SULFATE (2.5 MG/3ML) 0.083% IN NEBU: 3.0000 mL | INHALATION_SOLUTION | Freq: Four times a day (QID) | RESPIRATORY_TRACT | Status: DC | PRN

## 2022-01-28 MED ORDER — IPRATROPIUM BROMIDE 0.02 % IN SOLN
0.5000 mg | Freq: Once | RESPIRATORY_TRACT | Status: AC
  Administered 2022-01-28: 0.5 mg via RESPIRATORY_TRACT
  Filled 2022-01-28: qty 2.5

## 2022-01-28 MED ORDER — MOMETASONE FURO-FORMOTEROL FUM 200-5 MCG/ACT IN AERO
2.0000 | INHALATION_SPRAY | Freq: Two times a day (BID) | RESPIRATORY_TRACT | Status: DC
  Administered 2022-01-28 – 2022-01-29 (×3): 2 via RESPIRATORY_TRACT
  Filled 2022-01-28: qty 8.8

## 2022-01-28 NOTE — ED Triage Notes (Signed)
Pt BIB [facility] w/ c/o shob, hx of asthma. Initial sat was 60's per Fire, EMS reports once pt on neb mask O2 sats were 99%. 125mg  solumedrol IV, albuterol and duoneb given. Initial RR 40's after neb, came down to 20-22. Other VS WNL. 20g L AC.

## 2022-01-28 NOTE — H&P (Addendum)
History and Physical    Jason Cardenas GLO:[id number] DOB: 01/06/1961 DOA: 01/28/2022  PCP: Claiborne Rigg, NP (Confirm with patient/family/NH records and if not entered, this has to be entered at [facility] point of entry) Patient coming from: Home  I have personally briefly reviewed patient's old medical records in [facility]  Chief Complaint: Wheezing, shortness of breath  HPI: Jason Cardenas is a 61 y.o. male with medical history significant of mild intermittent asthma, seasonal allergies, presented with worsening of wheezing and shortness of breath.  Symptoms started last week with wheezing and shortness of breath no cough no fever chills, patient started to use around clock initially placed some success however since yesterday, symptoms of wheezing and shortness of breath has come back.  Patient used to be on Advair, however stopped taking it due to insurance changes.  He reported most of his asthma attacks happen in early autumn in the past.  He called EMS this morning, EMS arrived found O2 saturation in the 60s.  ED Course: Continue tachypneic, desaturated to 80% on room air, was given several rounds of IV Solu-Medrol, magnesium and albuterol, symptoms somewhat improved.  Still tachypneic when I saw him.  Breathing rate 24/min.  Chest x-ray clear, D-dimer negative.  Review of Systems: As per HPI otherwise 14 point review of systems negative.   Past Medical History:  Diagnosis Date   Asthma    Prediabetes     Past Surgical History:  Procedure Laterality Date   TONSILLECTOMY       reports that he has never smoked. He has never used smokeless tobacco. He reports current alcohol use. He reports that he does not use drugs.  No Known Allergies  Family History  Problem Relation Age of Onset   Diabetes Mother      Prior to Admission medications   Medication Sig Start Date End Date Taking? Authorizing Provider  albuterol (VENTOLIN HFA) 108 (90 Base) MCG/ACT inhaler  Inhale 1-2 puffs into the lungs every 6 (six) hours as needed for wheezing or shortness of breath. 11/29/20  Yes Edwin Dada P, DO  albuterol (VENTOLIN HFA) 108 (90 Base) MCG/ACT inhaler Inhale 1-2 puffs into the lungs every 6 (six) hours as needed for wheezing or shortness of breath. Patient not taking: Reported on 01/28/2022 11/29/20   Edwin Dada P, DO  cetirizine (ZYRTEC ALLERGY) 10 MG tablet Take 1 tablet (10 mg total) by mouth daily. Patient not taking: Reported on 01/28/2022 11/29/20   Edwin Dada P, DO  Fluticasone-Salmeterol (ADVAIR) 250-50 MCG/DOSE AEPB Inhale 1 puff into the lungs daily. Patient not taking: Reported on 01/28/2022 11/29/20   Edwin Dada P, DO  methylPREDNISolone (MEDROL DOSEPAK) 4 MG TBPK tablet Use as directed Patient not taking: Reported on 01/28/2022 07/16/21   Arthor Captain, PA-C  predniSONE (DELTASONE) 20 MG tablet Take 2 tablets (40 mg total) by mouth daily. Patient not taking: Reported on 01/28/2022 10/15/21   Roxy Horseman, PA-C    Physical Exam: Vitals:   01/28/22 0955 01/28/22 0956 01/28/22 1011 01/28/22 1145  BP:    (!) 148/94  Pulse:  85  87  Resp:  (!) 25  15  Temp: 97.9 F (36.6 C)     TempSrc: Oral     SpO2:  94% 96% 100%  Weight:      Height:        Constitutional: NAD, calm, comfortable Vitals:   01/28/22 0955 01/28/22 0956 01/28/22 1011 01/28/22 1145  BP:    Marland Kitchen)  148/94  Pulse:  85  87  Resp:  (!) 25  15  Temp: 97.9 F (36.6 C)     TempSrc: Oral     SpO2:  94% 96% 100%  Weight:      Height:       Eyes: PERRL, lids and conjunctivae normal ENMT: Mucous membranes are moist. Posterior pharynx clear of any exudate or lesions.Normal dentition.  Neck: normal, supple, no masses, no thyromegaly Respiratory: Diminished breath sound bilaterally, no crackles, scattered wheezing, increasing respiratory effort. No accessory muscle use.  Cardiovascular: Regular rate and rhythm, no murmurs / rubs / gallops. No extremity edema. 2+ pedal pulses. No  carotid bruits.  Abdomen: no tenderness, no masses palpated. No hepatosplenomegaly. Bowel sounds positive.  Musculoskeletal: no clubbing / cyanosis. No joint deformity upper and lower extremities. Good ROM, no contractures. Normal muscle tone.  Skin: no rashes, lesions, ulcers. No induration Neurologic: CN 2-12 grossly intact. Sensation intact, DTR normal. Strength 5/5 in all 4.  Psychiatric: Normal judgment and insight. Alert and oriented x 3. Normal mood.     Labs on Admission: I have personally reviewed following labs and imaging studies  CBC: Recent Labs  Lab 01/28/22 0858 01/28/22 0907  WBC 10.9*  --   NEUTROABS 8.5*  --   HGB 16.1 16.7  HCT 48.2 49.0  MCV 85.9  --   PLT 298  --    Basic Metabolic Panel: Recent Labs  Lab 01/28/22 0858 01/28/22 0907  NA 139 140  K 4.1 4.0  CL 105  --   CO2 26  --   GLUCOSE 123*  --   BUN 17  --   CREATININE 1.18  --   CALCIUM 9.2  --    GFR: Estimated Creatinine Clearance: 69.6 mL/min (by C-G formula based on SCr of 1.18 mg/dL). Liver Function Tests: No results for input(s): "AST", "ALT", "ALKPHOS", "BILITOT", "PROT", "ALBUMIN" in the last 168 hours. No results for input(s): "LIPASE", "AMYLASE" in the last 168 hours. No results for input(s): "AMMONIA" in the last 168 hours. Coagulation Profile: No results for input(s): "INR", "PROTIME" in the last 168 hours. Cardiac Enzymes: No results for input(s): "CKTOTAL", "CKMB", "CKMBINDEX", "TROPONINI" in the last 168 hours. BNP (last 3 results) No results for input(s): "PROBNP" in the last 8760 hours. HbA1C: No results for input(s): "HGBA1C" in the last 72 hours. CBG: No results for input(s): "GLUCAP" in the last 168 hours. Lipid Profile: No results for input(s): "CHOL", "HDL", "LDLCALC", "TRIG", "CHOLHDL", "LDLDIRECT" in the last 72 hours. Thyroid Function Tests: No results for input(s): "TSH", "T4TOTAL", "FREET4", "T3FREE", "THYROIDAB" in the last 72 hours. Anemia Panel: No  results for input(s): "VITAMINB12", "FOLATE", "FERRITIN", "TIBC", "IRON", "RETICCTPCT" in the last 72 hours. Urine analysis: No results found for: "COLORURINE", "APPEARANCEUR", "LABSPEC", "PHURINE", "GLUCOSEU", "HGBUR", "BILIRUBINUR", "KETONESUR", "PROTEINUR", "UROBILINOGEN", "NITRITE", "LEUKOCYTESUR"  Radiological Exams on Admission: DG Chest Port 1 View  Result Date: 01/28/2022 CLINICAL DATA:  61 year old male with history of shortness of breath. EXAM: PORTABLE CHEST 1 VIEW COMPARISON:  Chest x-ray 07/16/2021. FINDINGS: Lung volumes are normal. No consolidative airspace disease. No pleural effusions. No pneumothorax. No pulmonary nodule or mass noted. Pulmonary vasculature and the cardiomediastinal silhouette are within normal limits. IMPRESSION: No radiographic evidence of acute cardiopulmonary disease. Electronically Signed   By: Trudie Reed M.D.   On: 01/28/2022 08:50    EKG: Independently reviewed.  Sinus, no acute ST changes.  Assessment/Plan Principal Problem:   Asthma Active Problems:   Asthma due to  environmental allergies  (please populate well all problems here in Problem List. (For example, if patient is on BP meds at home and you resume or decide to hold them, it is a problem that needs to be her. Same for CAD, COPD, HLD and so on)  Acute hypoxic respite failure -Secondary to acute asthma exacerbation -Still somewhat unstable to discharge given still tachypneic and O2 saturation borderline low 93% on 2 L, continue IV Solu-Medrol, DuoNebs and as needed albuterol. -Peak flow of after treatment -Restart antihistamine -No fever and only mildly elevated WBC count, decided to hold off antibiotics.  DVT prophylaxis: SCD Code Status: Full code Family Communication: None at bedside Disposition Plan: Expect less than 2 midnight hospital stay Consults called: None Admission status: Telemetry observation   Lequita Halt MD [facility] Pager [phone]  01/28/2022, 12:33 PM

## 2022-01-28 NOTE — ED Notes (Signed)
Ambulated pt in room and pt denied shob, did not appear labored, but O2 was 85% on RA. Pt O2 at rest was 89% prior to ambulation. Notified PA.

## 2022-01-28 NOTE — ED Notes (Signed)
Called lab to have d dimer added to previous collection. Per lab, they will add d dimer.

## 2022-01-28 NOTE — Plan of Care (Signed)
  Problem: Education: Goal: Knowledge of General Education information will improve Description: Including pain rating scale, medication(s)/side effects and non-pharmacologic comfort measures Outcome: Progressing   Problem: Clinical Measurements: Goal: Ability to maintain clinical measurements within normal limits will improve Outcome: Progressing Goal: Will remain free from infection Outcome: Progressing Goal: Diagnostic test results will improve Outcome: Progressing Goal: Respiratory complications will improve Outcome: Progressing Goal: Cardiovascular complication will be avoided Outcome: Progressing   Problem: Activity: Goal: Risk for activity intolerance will decrease Outcome: Progressing   Problem: Safety: Goal: Ability to remain free from injury will improve Outcome: Progressing   Problem: Skin Integrity: Goal: Risk for impaired skin integrity will decrease Outcome: Progressing   

## 2022-01-28 NOTE — ED Provider Notes (Signed)
[facility] EMERGENCY DEPARTMENT Provider Note   CSN: [id number] Arrival date & time: 01/28/22  0805     History  Chief Complaint  Patient presents with   Shortness of Breath    Jason Cardenas is a 61 y.o. male.  Patient presents via EMS for severe SOB. Hx or asthma. Hx of status asthmaticus with previous need for hospitalization, no hx of intubations. Pt awoke at 2:30 am with wheezing and sob.  Took inhaler and was able to return to sleep. When he woke up he was have severe sob. Unable to relieve sxs at home. EMS reports RR of 44. Initial 02 sats 66% however some concern that fire's pulse ox may have been inaccurate. Pt given 5 of albuterol and RR came down to the 20s. Improved air mvmt after 2nd duoneb. Patient given solumedrol 125mg  pta. Patient denies fever, cough, cp,or edema.  The history is provided by the patient and the EMS personnel. No language interpreter was used.  Shortness of Breath Severity:  Severe Onset quality:  Gradual Duration:  6 hours Timing:  Constant Progression:  Worsening Chronicity:  Recurrent Relieved by:  Nothing Worsened by:  Nothing Ineffective treatments:  Inhaler Associated symptoms: wheezing   Associated symptoms: no chest pain, no fever and no sore throat        Home Medications Prior to Admission medications   Medication Sig Start Date End Date Taking? Authorizing Provider  predniSONE (DELTASONE) 20 MG tablet Take 2 tablets (40 mg total) by mouth daily with breakfast for 5 days. 01/29/22 02/03/22 Yes Rai, Ripudeep K, MD  albuterol (VENTOLIN HFA) 108 (90 Base) MCG/ACT inhaler Inhale 1-2 puffs into the lungs every 6 (six) hours as needed for wheezing or shortness of breath. 01/29/22   Rai, Delene Ruffini, MD  azithromycin (ZITHROMAX) 500 MG tablet Take 1 tablet (500 mg total) by mouth daily for 4 days. 01/31/22 02/04/22  Rai, Delene Ruffini, MD  cetirizine (ZYRTEC ALLERGY) 10 MG tablet Take 1 tablet (10 mg total) by mouth daily  as needed for allergies (Also available over-the-counter). 01/29/22   Rai, Ripudeep K, MD  Fluticasone-Salmeterol (ADVAIR) 250-50 MCG/DOSE AEPB Inhale 1 puff into the lungs daily. 01/29/22   Cathren Harsh, MD      Allergies    Patient has no known allergies.    Review of Systems   Review of Systems  Constitutional:  Negative for fever.  HENT:  Negative for sore throat.   Respiratory:  Positive for shortness of breath and wheezing.   Cardiovascular:  Negative for chest pain.    Physical Exam Updated Vital Signs BP (!) 141/89 (BP Location: Right Arm)   Pulse 88   Temp 97.8 F (36.6 C) (Oral)   Resp 18   Ht 5\' 10"  (1.778 m)   Wt 83.5 kg   SpO2 98%   BMI 26.40 kg/m  Physical Exam Vitals and nursing note reviewed.  Constitutional:      General: He is not in acute distress.    Appearance: He is well-developed. He is not diaphoretic.  HENT:     Head: Normocephalic and atraumatic.  Eyes:     General: No scleral icterus.    Conjunctiva/sclera: Conjunctivae normal.  Cardiovascular:     Rate and Rhythm: Normal rate and regular rhythm.     Heart sounds: Normal heart sounds.  Pulmonary:     Effort: Accessory muscle usage and prolonged expiration present. No tachypnea or respiratory distress.     Breath sounds:  Decreased air movement present. Wheezing present.     Comments: Breathing and speech labored. Prolonged expiratory phase, diminshed breath sounds. Faint exp wheezing in all lung fields. Abdominal:     Palpations: Abdomen is soft.     Tenderness: There is no abdominal tenderness.  Musculoskeletal:     Cervical back: Normal range of motion and neck supple.     Right lower leg: No edema.     Left lower leg: No edema.  Skin:    General: Skin is warm and dry.  Neurological:     Mental Status: He is alert.  Psychiatric:        Behavior: Behavior normal.     ED Results / Procedures / Treatments   Labs (all labs ordered are listed, but only abnormal results are  displayed) Labs Reviewed  BASIC METABOLIC PANEL - Abnormal; Notable for the following components:      Result Value   Glucose, Bld 123 (*)    All other components within normal limits  CBC WITH DIFFERENTIAL/PLATELET - Abnormal; Notable for the following components:   WBC 10.9 (*)    Neutro Abs 8.5 (*)    All other components within normal limits  BLOOD GAS, VENOUS - Abnormal; Notable for the following components:   pCO2, Ven 35 (*)    pO2, Ven 67 (*)    All other components within normal limits  I-STAT VENOUS BLOOD GAS, ED - Abnormal; Notable for the following components:   pCO2, Ven 38.1 (*)    pO2, Ven 24 (*)    Calcium, Ion 1.08 (*)    All other components within normal limits  SARS CORONAVIRUS 2 BY RT PCR  BRAIN NATRIURETIC PEPTIDE  D-DIMER, QUANTITATIVE  HIV ANTIBODY (ROUTINE TESTING W REFLEX)    EKG EKG Interpretation  Date/Time:  Saturday January 28 2022 08:12:28 EDT Ventricular Rate:  79 PR Interval:  178 QRS Duration: 81 QT Interval:  392 QTC Calculation: 450 R Axis:   68 Text Interpretation: Sinus rhythm Probable left atrial enlargement Confirmed by Vonita Moss [id number]) on 01/28/2022 8:23:01 AM  Radiology DG Chest Port 1 View  Result Date: 01/28/2022 CLINICAL DATA:  61 year old male with history of shortness of breath. EXAM: PORTABLE CHEST 1 VIEW COMPARISON:  Chest x-ray 07/16/2021. FINDINGS: Lung volumes are normal. No consolidative airspace disease. No pleural effusions. No pneumothorax. No pulmonary nodule or mass noted. Pulmonary vasculature and the cardiomediastinal silhouette are within normal limits. IMPRESSION: No radiographic evidence of acute cardiopulmonary disease. Electronically Signed   By: Trudie Reed M.D.   On: 01/28/2022 08:50    Procedures .Critical Care  Performed by: Arthor Captain, PA-C Authorized by: Arthor Captain, PA-C   Critical care provider statement:    Critical care time (minutes):  60   Critical care time was exclusive  of:  Separately billable procedures and treating other patients   Critical care was necessary to treat or prevent imminent or life-threatening deterioration of the following conditions:  Respiratory failure   Critical care was time spent personally by me on the following activities:  Development of treatment plan with patient or surrogate, discussions with consultants, evaluation of patient's response to treatment, examination of patient, ordering and review of laboratory studies, ordering and review of radiographic studies, ordering and performing treatments and interventions, pulse oximetry, re-evaluation of patient's condition and review of old charts     Medications Ordered in ED Medications  albuterol (PROVENTIL) (2.5 MG/3ML) 0.083% nebulizer solution 3 mL (has no administration in time range)  loratadine (CLARITIN) tablet 10 mg (10 mg Oral Given 01/29/22 0825)  mometasone-formoterol (DULERA) 200-5 MCG/ACT inhaler 2 puff (2 puffs Inhalation Given 01/29/22 0853)  methylPREDNISolone sodium succinate (SOLU-MEDROL) 125 mg/2 mL injection 120 mg (120 mg Intravenous Given 01/29/22 0920)  azithromycin (ZITHROMAX) tablet 500 mg (500 mg Oral Given 01/29/22 0920)  ipratropium-albuterol (DUONEB) 0.5-2.5 (3) MG/3ML nebulizer solution 3 mL (has no administration in time range)  magnesium sulfate IVPB 2 g 50 mL (0 g Intravenous Stopped 01/28/22 0843)  albuterol (PROVENTIL) (2.5 MG/3ML) 0.083% nebulizer solution (10 mg/hr Nebulization Given 01/28/22 0825)  ipratropium (ATROVENT) nebulizer solution 0.5 mg (0.5 mg Nebulization Given 01/28/22 0821)  albuterol (PROVENTIL) (2.5 MG/3ML) 0.083% nebulizer solution 5 mg (5 mg Nebulization Given 01/28/22 1042)    ED Course/ Medical Decision Making/ A&P Clinical Course as of 01/29/22 1116  Sat Jan 28, 2022  0923 I was informed by the nurse that patient's oxygen saturations had dropped down to 88%.  She placed him on 4 L.  I went in to reassess the patient.  He seems to be  moving air extremely well.  I can hear breath sounds in all of his lung fields and I do not hear wheezing at this time.  I set the patient into a more upright position and took off the oxygen, watch him for several minutes and oxygen saturations appear to be 99% on room air. [AH]  0956 Patient 02sat 88%. Unable to get a waveform with ambulation. At rest, 02 sat 84% [AH]    Clinical Course User Index [AH] Arthor Captain, PA-C                           Medical Decision Making This patient presents to the ED for concern of sob, this involves an extensive number of treatment options, and is a complaint that carries with it a high risk of complications and morbidity.  The emergent differential diagnosis for shortness of breath includes, but is not limited to, Pulmonary edema, bronchoconstriction, Pneumonia, Pulmonary embolism, Pneumotherax/ Hemothorax, Dysrythmia, ACS.   Co morbidities that complicate the patient evaluation       asthma   Additional history obtained:  Additional history obtained from ems    Lab Tests:  I Ordered, and personally interpreted labs.  The pertinent results include:   VBG with pure hypoxia, BMP without abnormality, mildly elevated white blood cell count of insignificant value remainder of labs within normal limits   Imaging Studies ordered:  I ordered imaging studies including chest x-ray I independently visualized and interpreted imaging which showed no acute findings I agree with the radiologist interpretation   Cardiac Monitoring:       The patient was maintained on a cardiac monitor.  I personally viewed and interpreted the cardiac monitored which showed an underlying rhythm of: Sinus rhythm at a rate of 79   Medicines ordered and prescription drug management:  I ordered medication including magnesium, albuterol, Atrovent, oxygen for aspiratory failure and wheezing Reevaluation of the patient after these medicines showed that the patient  improved I have reviewed the patients home medicines and have made adjustments as needed   Test Considered:       CT angiogram however D-dimer negative   Critical Interventions:       Medication and oxygen supplementation   Consultations Obtained:  I requested consultation with the Dr. Chipper Herb,  and discussed lab and imaging findings as well as pertinent plan - they recommend: Admit  Problem List / ED Course:       (J45.42) Moderate persistent asthma with status asthmaticus  (primary encounter diagnosis)  (J96.01) Acute respiratory failure with hypoxia (HCC)     Reevaluation:  After the interventions noted above, I reevaluated the patient and found that they have :improved   Social Determinants of Health:  uninsured   Dispostion:  After consideration of the diagnostic results and the patients response to treatment, I feel that the patent would benefit from admission.    Amount and/or Complexity of Data Reviewed Labs: ordered. Radiology: ordered. ECG/medicine tests: ordered.  Risk Prescription drug management. Decision regarding hospitalization.           Final Clinical Impression(s) / ED Diagnoses Final diagnoses:  Moderate persistent asthma with status asthmaticus  Acute respiratory failure with hypoxia [facility])    Rx / DC Orders ED Discharge Orders          Ordered    azithromycin (ZITHROMAX) 500 MG tablet  Daily        01/29/22 0958    albuterol (VENTOLIN HFA) 108 (90 Base) MCG/ACT inhaler  Every 6 hours PRN        01/29/22 0958    Fluticasone-Salmeterol (ADVAIR) 250-50 MCG/DOSE AEPB  Daily        01/29/22 0958    predniSONE (DELTASONE) 20 MG tablet  Daily with breakfast        01/29/22 0958    cetirizine (ZYRTEC ALLERGY) 10 MG tablet  Daily PRN        01/29/22 0958    Increase activity slowly        01/29/22 0958    Diet - low sodium heart healthy        01/29/22 0958              Arthor Captain, PA-C 01/29/22 1120     Rondel Baton, MD 01/29/22 1527

## 2022-01-29 DIAGNOSIS — J45909 Unspecified asthma, uncomplicated: Secondary | ICD-10-CM

## 2022-01-29 DIAGNOSIS — J4542 Moderate persistent asthma with status asthmaticus: Secondary | ICD-10-CM

## 2022-01-29 DIAGNOSIS — J4521 Mild intermittent asthma with (acute) exacerbation: Secondary | ICD-10-CM

## 2022-01-29 DIAGNOSIS — J9601 Acute respiratory failure with hypoxia: Secondary | ICD-10-CM

## 2022-01-29 MED ORDER — IPRATROPIUM-ALBUTEROL 0.5-2.5 (3) MG/3ML IN SOLN: 3.0000 mL | Freq: Two times a day (BID) | RESPIRATORY_TRACT | Status: DC

## 2022-01-29 MED ORDER — CETIRIZINE HCL 10 MG PO TABS: 10.0000 mg | ORAL_TABLET | Freq: Every day | ORAL | 0 refills | Status: DC | PRN

## 2022-01-29 MED ORDER — AZITHROMYCIN 500 MG PO TABS: 500.0000 mg | ORAL_TABLET | Freq: Every day | ORAL | 0 refills | Status: AC

## 2022-01-29 MED ORDER — AZITHROMYCIN 500 MG PO TABS
500.0000 mg | ORAL_TABLET | Freq: Every day | ORAL | Status: DC
  Administered 2022-01-29: 500 mg via ORAL
  Filled 2022-01-29: qty 1

## 2022-01-29 MED ORDER — METHYLPREDNISOLONE SODIUM SUCC 125 MG IJ SOLR
120.0000 mg | INTRAMUSCULAR | Status: DC
  Administered 2022-01-29: 120 mg via INTRAVENOUS
  Filled 2022-01-29: qty 2

## 2022-01-29 MED ORDER — PREDNISONE 20 MG PO TABS: 40.0000 mg | ORAL_TABLET | Freq: Every day | ORAL | 0 refills | Status: AC

## 2022-01-29 MED ORDER — FLUTICASONE-SALMETEROL 250-50 MCG/DOSE IN AEPB: 1.0000 | INHALATION_SPRAY | Freq: Every day | RESPIRATORY_TRACT | 2 refills | Status: DC

## 2022-01-29 MED ORDER — ALBUTEROL SULFATE HFA 108 (90 BASE) MCG/ACT IN AERS: 1.0000 | INHALATION_SPRAY | Freq: Four times a day (QID) | RESPIRATORY_TRACT | 3 refills | Status: DC | PRN

## 2022-01-29 NOTE — Plan of Care (Signed)
  Problem: Education: Goal: Knowledge of General Education information will improve Description: Including pain rating scale, medication(s)/side effects and non-pharmacologic comfort measures Outcome: Completed/Met   Problem: Clinical Measurements: Goal: Ability to maintain clinical measurements within normal limits will improve Outcome: Completed/Met Goal: Will remain free from infection Outcome: Completed/Met Goal: Diagnostic test results will improve Outcome: Completed/Met Goal: Respiratory complications will improve Outcome: Completed/Met Goal: Cardiovascular complication will be avoided Outcome: Completed/Met   Problem: Activity: Goal: Risk for activity intolerance will decrease Outcome: Completed/Met   Problem: Safety: Goal: Ability to remain free from injury will improve Outcome: Completed/Met   Problem: Skin Integrity: Goal: Risk for impaired skin integrity will decrease Outcome: Completed/Met

## 2022-01-29 NOTE — Discharge Summary (Signed)
Physician Discharge Summary   Patient: Jason Cardenas MRN: [id number] DOB: Aug 26, 1960  Admit date:     01/28/2022  Discharge date: 01/29/22  Discharge Physician: Estill Cotta, MD    PCP: Gildardo Pounds, NP   Recommendations at discharge:   Placed on prednisone 40 mg daily for 5 days Continue Zithromax 500 mg daily for 4 more days  Discharge Diagnoses:  Acute asthma exacerbation Acute respiratory failure with hypoxia    Hospital Course: Patient is a 61 year old male with mild intermittent asthma, seasonal allergies, presented with wheezing and shortness of breath.Symptoms started last week with wheezing and shortness of breath no cough no fever chills, patient started to use around clock initially placed some success however since yesterday, symptoms of wheezing and shortness of breath has come back.  Patient used to be on Advair, however stopped taking it due to insurance changes.  He reported most of his asthma attacks happen in early autumn in the past.  He called EMS this morning, EMS arrived found O2 saturation in the 60s.  In ED, noted to have O2 sats 80% on room air, received several rounds of IV Solu-Medrol, magnesium, albuterol and symptoms improving  Assessment and Plan:   Acute respiratory failure with hypoxia secondary to acute asthma exacerbation. -Patient was noted to have O2 sats 80% on room air in ED, placed on O2 -Received IV Solu-Medrol, nebs treatments -Chest x-ray clear with no pneumonia, D-dimer negative -Much improved, no significant wheezing, will do home O2 evaluation prior to discharge -Placed on Zithromax 500 mg daily for total 5 days, prednisone 40 mg daily for 5 days, continue albuterol inhaler as needed, Advair twice daily      Pain control - [place] Controlled Substance Reporting System database was reviewed. and patient was instructed, not to drive, operate heavy machinery, perform activities at heights, swimming or participation in water  activities or provide baby-sitting services while on Pain, Sleep and Anxiety Medications; until their outpatient Physician has advised to do so again. Also recommended to not to take more than prescribed Pain, Sleep and Anxiety Medications.  Consultants: None Procedures performed: None Disposition: Home Diet recommendation:  Discharge Diet Orders (From admission, onward)     Start     Ordered   01/29/22 0000  Diet - low sodium heart healthy        01/29/22 0958           Regular diet DISCHARGE MEDICATION: Allergies as of 01/29/2022   No Known Allergies      Medication List     TAKE these medications    albuterol 108 (90 Base) MCG/ACT inhaler Commonly known as: VENTOLIN HFA Inhale 1-2 puffs into the lungs every 6 (six) hours as needed for wheezing or shortness of breath.   azithromycin 500 MG tablet Commonly known as: ZITHROMAX Take 1 tablet (500 mg total) by mouth daily for 4 days. Start taking on: January 31, 2022   cetirizine 10 MG tablet Commonly known as: ZyrTEC Allergy Take 1 tablet (10 mg total) by mouth daily as needed for allergies (Also available over-the-counter). What changed:  when to take this reasons to take this   Fluticasone-Salmeterol 250-50 MCG/DOSE Aepb Commonly known as: ADVAIR Inhale 1 puff into the lungs daily.   predniSONE 20 MG tablet Commonly known as: DELTASONE Take 2 tablets (40 mg total) by mouth daily with breakfast for 5 days.        Follow-up Information     Gildardo Pounds, NP. Schedule  an appointment as soon as possible for a visit in 2 week(s).   Specialty: Nurse Practitioner Why: for hospital follow-up Contact information: [place] [address] [phone]                Discharge Exam: Danley Danker Weights   01/28/22 0812  Weight: 83.5 kg   S: Doing well, no significant shortness of breath or wheezing.   Vitals:   01/29/22 0150 01/29/22 0545 01/29/22 0854 01/29/22 0911  BP:  120/82  (!)  141/89  Pulse: 88 68 68 88  Resp: 18 18 18 18   Temp:  98.4 F (36.9 C)  97.8 F (36.6 C)  TempSrc:    Oral  SpO2: 99% 97%  98%  Weight:      Height:        Physical Exam General: Alert and oriented x 3, NAD Cardiovascular: S1 S2 clear, RRR.  Respiratory: CTAB, no wheezing, rales or rhonchi Gastrointestinal: Soft, nontender, nondistended, NBS Ext: no pedal edema bilaterally Psych: Normal affect and demeanor, alert and oriented x3    Condition at discharge: fair  The results of significant diagnostics from this hospitalization (including imaging, microbiology, ancillary and laboratory) are listed below for reference.   Imaging Studies: DG Chest Port 1 View  Result Date: 01/28/2022 CLINICAL DATA:  61 year old male with history of shortness of breath. EXAM: PORTABLE CHEST 1 VIEW COMPARISON:  Chest x-ray 07/16/2021. FINDINGS: Lung volumes are normal. No consolidative airspace disease. No pleural effusions. No pneumothorax. No pulmonary nodule or mass noted. Pulmonary vasculature and the cardiomediastinal silhouette are within normal limits. IMPRESSION: No radiographic evidence of acute cardiopulmonary disease. Electronically Signed   By: Vinnie Langton M.D.   On: 01/28/2022 08:50    Microbiology: Results for orders placed or performed during the hospital encounter of 01/28/22  SARS Coronavirus 2 by RT PCR (hospital order, performed in [facility] hospital lab) *cepheid single result test* Anterior Nasal Swab     Status: None   Collection Time: 01/28/22  8:16 AM   Specimen: Anterior Nasal Swab  Result Value Ref Range Status   SARS Coronavirus 2 by RT PCR NEGATIVE NEGATIVE Final    Comment: (NOTE) SARS-CoV-2 target nucleic acids are NOT DETECTED.  The SARS-CoV-2 RNA is generally detectable in upper and lower respiratory specimens during the acute phase of infection. The lowest concentration of SARS-CoV-2 viral copies this assay can detect is 250 copies / mL. A negative result  does not preclude SARS-CoV-2 infection and should not be used as the sole basis for treatment or other patient management decisions.  A negative result may occur with improper specimen collection / handling, submission of specimen other than nasopharyngeal swab, presence of viral mutation(s) within the areas targeted by this assay, and inadequate number of viral copies (<250 copies / mL). A negative result must be combined with clinical observations, patient history, and epidemiological information.  Fact Sheet for Patients:   https://www.patel.info/  Fact Sheet for Healthcare Providers: https://hall.com/  This test is not yet approved or  cleared by the [place] FDA and has been authorized for detection and/or diagnosis of SARS-CoV-2 by FDA under an Emergency Use Authorization (EUA).  This EUA will remain in effect (meaning this test can be used) for the duration of the COVID-19 declaration under Section 564(b)(1) of the Act, 21 U.S.C. section 360bbb-3(b)(1), unless the authorization is terminated or revoked sooner.  Performed at [facility], [place] [address] [place], [place] [address]  Labs: CBC: Recent Labs  Lab 01/28/22 0858 01/28/22 0907  WBC 10.9*  --   NEUTROABS 8.5*  --   HGB 16.1 16.7  HCT 48.2 49.0  MCV 85.9  --   PLT 298  --    Basic Metabolic Panel: Recent Labs  Lab 01/28/22 0858 01/28/22 0907  NA 139 140  K 4.1 4.0  CL 105  --   CO2 26  --   GLUCOSE 123*  --   BUN 17  --   CREATININE 1.18  --   CALCIUM 9.2  --    Liver Function Tests: No results for input(s): "AST", "ALT", "ALKPHOS", "BILITOT", "PROT", "ALBUMIN" in the last 168 hours. CBG: No results for input(s): "GLUCAP" in the last 168 hours.  Discharge time spent: greater than 30 minutes.  Signed: Estill Cotta, MD [facility] 01/29/2022

## 2022-01-29 NOTE — Progress Notes (Signed)
Patient ambulated in room and hall on RA. Oxygen saturation 96% ambulating on RA. No c/o SOB.

## 2022-01-29 NOTE — Progress Notes (Signed)
DISCHARGE NOTE HOME Jason Cardenas to be discharged Home per MD order. Discussed diagnosis, treatment, prescriptions and follow up appointments with the patient. Prescriptions medication list explained in detail. Patient verbalized understanding. Patient up ambulating in room on room air. 97% and no SOB.  Skin clean, dry and intact without evidence of skin break down, no evidence of skin tears noted. IV catheter discontinued intact. Site without signs and symptoms of complications. Dressing and pressure applied. Pt denies pain at the site currently. No complaints noted.  Patient free of lines, drains, and wounds.   An After Visit Summary (AVS) was printed and given to the patient. Patient escorted via wheelchair, and discharged home via private auto.  Virgina Jock, RN

## 2022-01-30 ENCOUNTER — Telehealth: Payer: Self-pay

## 2022-01-30 NOTE — Telephone Encounter (Signed)
Transition Care Management Unsuccessful Follow-up Telephone Call  Date of discharge and from where:  01/29/2022, [facility]  Attempts:  1st Attempt  Reason for unsuccessful TCM follow-up call:  Left voice message - [phone], call back requested.   Need to discuss scheduling a hospital follow up appointment.  Patient has not been seen at [facility] since 2020

## 2022-01-31 ENCOUNTER — Telehealth: Payer: Self-pay

## 2022-01-31 NOTE — Telephone Encounter (Signed)
Transition Care Management Unsuccessful Follow-up Telephone Call  Date of discharge and from where:  01/29/2022, [facility]   Attempts:  2nd Attempt  Reason for unsuccessful TCM follow-up call:  Left voice message-[phone], call back requested.    Need to discuss scheduling a hospital follow up appointment.  Patient has not been seen at [facility] since 2020

## 2022-02-01 ENCOUNTER — Telehealth: Payer: Self-pay

## 2022-02-01 NOTE — Telephone Encounter (Signed)
Transition Care Management Unsuccessful Follow-up Telephone Call  Date of discharge and from where:  01/29/2022, [facility]  Attempts:  3rd Attempt  Reason for unsuccessful TCM follow-up call:  Unable to reach patient at [phone], the phone just rings.    Letter sent to patient requesting he contact [facility] to schedule a hospital follow up appointment as we have not been able to reach him.   Patient has not been seen at [facility] since 2020

## 2022-02-05 ENCOUNTER — Other Ambulatory Visit: Payer: Self-pay

## 2022-02-05 ENCOUNTER — Emergency Department (HOSPITAL_COMMUNITY)
Admission: EM | Admit: 2022-02-05 | Discharge: 2022-02-05 | Disposition: A | Discharge status: Home / Self Care | Payer: Self-pay | Attending: Emergency Medicine | Admitting: Emergency Medicine

## 2022-02-05 DIAGNOSIS — J45901 Unspecified asthma with (acute) exacerbation: Secondary | ICD-10-CM | POA: Insufficient documentation

## 2022-02-05 DIAGNOSIS — Z7951 Long term (current) use of inhaled steroids: Secondary | ICD-10-CM | POA: Insufficient documentation

## 2022-02-05 DIAGNOSIS — J9801 Acute bronchospasm: Secondary | ICD-10-CM | POA: Insufficient documentation

## 2022-02-05 MED ORDER — FLUTICASONE-SALMETEROL 250-50 MCG/DOSE IN AEPB: 1.0000 | INHALATION_SPRAY | Freq: Every day | RESPIRATORY_TRACT | 2 refills | Status: DC

## 2022-02-05 MED ORDER — ALBUTEROL SULFATE HFA 108 (90 BASE) MCG/ACT IN AERS
2.0000 | INHALATION_SPRAY | RESPIRATORY_TRACT | Status: DC | PRN
  Administered 2022-02-05: 2 via RESPIRATORY_TRACT
  Filled 2022-02-05: qty 6.7

## 2022-02-05 MED ORDER — AEROCHAMBER Z-STAT PLUS/MEDIUM MISC
1.0000 | Freq: Once | Status: AC
  Administered 2022-02-05: 1
  Filled 2022-02-05: qty 1

## 2022-02-05 NOTE — ED Triage Notes (Signed)
BIBA w/ c/o shob x 1 day.  Ems reports diaphoretic, tripod, and diminished lungs on arrival.  Pt d/c from hospital on 10/8 with prednisone, last dose was Thursday Hx asthma Ems admin 10 alb, 0.5 Atrovent, 2mg  mag, and 125mg  soul-medrol.

## 2022-02-05 NOTE — ED Provider Notes (Signed)
[facility]-EMERGENCY DEPT Provider Note: Lowella Dell, MD, FACEP  CSN: [id number] MRN: [id number] ARRIVAL: 02/05/22 at 0147 ROOM: WA06/[facility]06   CHIEF COMPLAINT  Shortness of Breath   HISTORY OF PRESENT ILLNESS  02/05/22 2:59 AM Jason Cardenas is a 61 y.o. male history of asthma.  He is here 1 day of shortness of breath.  EMS found to be diaphoretic and tripoding with diminished breath sounds on arrival.  He was hospitalized on 01/28/2022 for an asthma exacerbation and was discharged the next day with prednisone.  His last dose was 3 days ago.  Prior to arrival EMS administered 10 mg of albuterol and 0.5 mg of ipratropium by neb.  He was given 2 g of magnesium sulfate and 125 mg of Solu-Medrol IV with significant improvement.  He is now asleep but readily awakened.  He denies any respiratory distress at this time.  He does need a new albuterol inhaler.   Past Medical History:  Diagnosis Date   Asthma    Prediabetes     Past Surgical History:  Procedure Laterality Date   TONSILLECTOMY      Family History  Problem Relation Age of Onset   Diabetes Mother     Social History   Tobacco Use   Smoking status: Never   Smokeless tobacco: Never  Vaping Use   Vaping Use: Never used  Substance Use Topics   Alcohol use: Yes    Comment: socially    Drug use: No    Prior to Admission medications   Medication Sig Start Date End Date Taking? Authorizing Provider  albuterol (VENTOLIN HFA) 108 (90 Base) MCG/ACT inhaler Inhale 1-2 puffs into the lungs every 6 (six) hours as needed for wheezing or shortness of breath. 01/29/22  Yes Rai, Ripudeep K, MD  cetirizine (ZYRTEC ALLERGY) 10 MG tablet Take 1 tablet (10 mg total) by mouth daily as needed for allergies (Also available over-the-counter). 01/29/22  Yes Rai, Ripudeep K, MD  Fluticasone-Salmeterol (ADVAIR) 250-50 MCG/DOSE AEPB Inhale 1 puff into the lungs daily. 02/05/22   Tay Whitwell, Jonny Ruiz, MD    Allergies Patient has no known  allergies.   REVIEW OF SYSTEMS  Negative except as noted here or in the History of Present Illness.   PHYSICAL EXAMINATION  Initial Vital Signs Blood pressure (!) 140/89, pulse 80, temperature 98 F (36.7 C), resp. rate 20, SpO2 99 %.  Examination General: Well-developed, well-nourished male in no acute distress; appearance consistent with age of record HENT: normocephalic; atraumatic Eyes: Normal appearance Neck: supple Heart: regular rate and rhythm Lungs: clear to auscultation bilaterally; no accessory muscle use; no tachypnea Abdomen: soft; nondistended; nontender; bowel sounds present Extremities: No deformity; full range of motion; pulses normal Neurologic: Awake, alert and oriented; motor function intact in all extremities and symmetric; no facial droop Skin: Warm and dry Psychiatric: Normal mood and affect   RESULTS  Summary of this visit's results, reviewed and interpreted by myself:   EKG Interpretation  Date/Time:    Ventricular Rate:    PR Interval:    QRS Duration:   QT Interval:    QTC Calculation:   R Axis:     Text Interpretation:         Laboratory Studies: No results found for this or any previous visit (from the past 24 hour(s)). Imaging Studies: No results found.  ED COURSE and MDM  Nursing notes, initial and subsequent vitals signs, including pulse oximetry, reviewed and interpreted by myself.  Vitals:   02/05/22 0200  BP: (!) 140/89  Pulse: 80  Resp: 20  Temp: 98 F (36.7 C)  SpO2: 99%   Medications  albuterol (VENTOLIN HFA) 108 (90 Base) MCG/ACT inhaler 2 puff (has no administration in time range)  aerochamber Z-Stat Plus/medium 1 each (has no administration in time range)   Patient appears to have made a full recovery from his earlier asthma attack.  This is likely exacerbated by being out of his home albuterol.  We will refill his albuterol inhaler and AeroChamber.   PROCEDURES  Procedures   ED DIAGNOSES     ICD-10-CM    1. Acute bronchospasm  J98.01          Keirsten Matuska, Jenny Reichmann, MD 02/05/22 5102

## 2022-02-16 ENCOUNTER — Other Ambulatory Visit: Payer: Self-pay

## 2022-02-16 ENCOUNTER — Encounter (HOSPITAL_COMMUNITY): Payer: Self-pay | Admitting: *Deleted

## 2022-02-16 ENCOUNTER — Emergency Department (HOSPITAL_COMMUNITY): Payer: Self-pay

## 2022-02-16 ENCOUNTER — Emergency Department (HOSPITAL_COMMUNITY)
Admission: EM | Admit: 2022-02-16 | Discharge: 2022-02-16 | Disposition: A | Discharge status: Home / Self Care | Payer: Self-pay | Attending: Emergency Medicine | Admitting: Emergency Medicine

## 2022-02-16 DIAGNOSIS — Z7951 Long term (current) use of inhaled steroids: Secondary | ICD-10-CM | POA: Insufficient documentation

## 2022-02-16 DIAGNOSIS — J4541 Moderate persistent asthma with (acute) exacerbation: Secondary | ICD-10-CM | POA: Insufficient documentation

## 2022-02-16 DIAGNOSIS — D72829 Elevated white blood cell count, unspecified: Secondary | ICD-10-CM | POA: Insufficient documentation

## 2022-02-16 LAB — BASIC METABOLIC PANEL
Anion gap: 10 (ref 5–15)
BUN: 12 mg/dL (ref 6–20)
CO2: 23 mmol/L (ref 22–32)
Calcium: 8.9 mg/dL (ref 8.9–10.3)
Chloride: 105 mmol/L (ref 98–111)
Creatinine, Ser: 1.14 mg/dL (ref 0.61–1.24)
GFR, Estimated: >60 mL/min (ref >60)
Glucose, Bld: 169 mg/dL — ABNORMAL HIGH (ref 70–99)
Potassium: 3.7 mmol/L (ref 3.5–5.1)
Sodium: 138 mmol/L (ref 135–145)

## 2022-02-16 LAB — CBC
HCT: 46 % (ref 39.0–52.0)
Hemoglobin: 15.2 g/dL (ref 13.0–17.0)
MCH: 28.7 pg (ref 26.0–34.0)
MCHC: 33 g/dL (ref 30.0–36.0)
MCV: 86.8 fL (ref 80.0–100.0)
Platelets: 363 10*3/uL (ref 150–400)
RBC: 5.3 MIL/uL (ref 4.22–5.81)
RDW: 14.1 % (ref 11.5–15.5)
WBC: 12.4 10*3/uL — ABNORMAL HIGH (ref 4.0–10.5)
nRBC: 0 % (ref 0.0–0.2)

## 2022-02-16 MED ORDER — ALBUTEROL (5 MG/ML) CONTINUOUS INHALATION SOLN
10.0000 mg | INHALATION_SOLUTION | Freq: Once | RESPIRATORY_TRACT | Status: AC
  Administered 2022-02-16: 10 mg via RESPIRATORY_TRACT
  Filled 2022-02-16: qty 20

## 2022-02-16 MED ORDER — ALBUTEROL SULFATE HFA 108 (90 BASE) MCG/ACT IN AERS
2.0000 | INHALATION_SPRAY | Freq: Once | RESPIRATORY_TRACT | Status: AC
  Administered 2022-02-16: 2 via RESPIRATORY_TRACT
  Filled 2022-02-16: qty 6.7

## 2022-02-16 MED ORDER — ALBUTEROL (5 MG/ML) CONTINUOUS INHALATION SOLN
10.0000 mg/h | INHALATION_SOLUTION | Freq: Once | RESPIRATORY_TRACT | Status: DC
  Filled 2022-02-16: qty 20

## 2022-02-16 MED ORDER — PREDNISONE 50 MG PO TABS: 50.0000 mg | ORAL_TABLET | Freq: Every day | ORAL | 0 refills | Status: DC

## 2022-02-16 NOTE — ED Triage Notes (Signed)
Pt arrived with EMS for sudden onset of sob. Denies any pain. EMS gave 125mg  solumedrol, 2g mag, 0.3 mg EPI, duoneb and placed on cpap. Pt reports improvement of sob with cpap. EMS VS 125/84 pulse 116

## 2022-02-16 NOTE — ED Notes (Signed)
Ambulates without becoming sob sats 96 %

## 2022-02-16 NOTE — ED Provider Notes (Signed)
[facility] EMERGENCY DEPARTMENT Provider Note   CSN: [id number] Arrival date & time: 02/16/22  0251     History  Chief Complaint  Patient presents with   Respiratory Distress   Level 5 caveat due to acuity of condition Jason Cardenas is a 61 y.o. male.  The history is provided by the patient and the EMS personnel.  Shortness of Breath Severity:  Severe Onset quality:  Sudden Timing:  Constant Progression:  Worsening Chronicity:  New Associated symptoms: cough   Patient with history of asthma presents with shortness of breath. Patient reports sudden onset of cough, wheezing and shortness of breath.  EMS was called and they noted patient was hypoxic and diaphoretic.  They report he had very little air movement.  He was given epinephrine, DuoNeb and placed on noninvasive ventilation.  He was also given Solu-Medrol.  No other details are known on arrival    Past Medical History:  Diagnosis Date   Asthma    Prediabetes     Home Medications Prior to Admission medications   Medication Sig Start Date End Date Taking? Authorizing Provider  predniSONE (DELTASONE) 50 MG tablet Take 1 tablet (50 mg total) by mouth daily with breakfast. 02/16/22  Yes Ripley Fraise, MD  albuterol (VENTOLIN HFA) 108 (90 Base) MCG/ACT inhaler Inhale 1-2 puffs into the lungs every 6 (six) hours as needed for wheezing or shortness of breath. 01/29/22   Rai, Vernelle Emerald, MD  cetirizine (ZYRTEC ALLERGY) 10 MG tablet Take 1 tablet (10 mg total) by mouth daily as needed for allergies (Also available over-the-counter). 01/29/22   Rai, Ripudeep K, MD  Fluticasone-Salmeterol (ADVAIR) 250-50 MCG/DOSE AEPB Inhale 1 puff into the lungs daily. 02/05/22   Molpus, Jenny Reichmann, MD      Allergies    Patient has no known allergies.    Review of Systems   Review of Systems  Unable to perform ROS: Acuity of condition  Respiratory:  Positive for cough and shortness of breath.     Physical  Exam Updated Vital Signs BP 113/77 (BP Location: Left Arm)   Pulse 99   Temp (!) 97.2 F (36.2 C) (Oral)   Resp 18   SpO2 95%  Physical Exam CONSTITUTIONAL: Ill-appearing HEAD: Normocephalic/atraumatic EYES: EOMI/PERRL ENMT: CPAP mask in place NECK: supple no meningeal signs SPINE/BACK:entire spine nontender CV: S1/S2 noted, tachycardic LUNGS: Tachypnea, wheezing bilaterally ABDOMEN: soft, nontender NEURO: Pt is awake/alert/appropriate, moves all extremitiesx4.  No facial droop.   EXTREMITIES: pulses normal/equal, full ROM, no significant lower extremity edema SKIN: warm, color normal  ED Results / Procedures / Treatments   Labs (all labs ordered are listed, but only abnormal results are displayed) Labs Reviewed  CBC - Abnormal; Notable for the following components:      Result Value   WBC 12.4 (*)    All other components within normal limits  BASIC METABOLIC PANEL - Abnormal; Notable for the following components:   Glucose, Bld 169 (*)    All other components within normal limits    EKG EKG Interpretation  Date/Time:  Thursday February 16 2022 02:57:39 EDT Ventricular Rate:  109 PR Interval:  162 QRS Duration: 85 QT Interval:  342 QTC Calculation: 461 R Axis:   84 Text Interpretation: Sinus tachycardia Confirmed by Ripley Fraise ([id number]) on 02/16/2022 3:16:39 AM  Radiology DG Chest Port 1 View  Result Date: 02/16/2022 CLINICAL DATA:  Shortness of breath.  Respiratory distress. EXAM: PORTABLE CHEST 1 VIEW COMPARISON:  01/28/2022 FINDINGS: Heart  size and pulmonary vascularity are normal. Lungs are clear. No pleural effusions. No pneumothorax. Mediastinal contours appear intact. IMPRESSION: No active disease. Electronically Signed   By: Burman Nieves M.D.   On: 02/16/2022 03:14    Procedures .Critical Care  Performed by: Zadie Rhine, MD Authorized by: Zadie Rhine, MD   Critical care provider statement:    Critical care time (minutes):  61    Critical care start time:  02/16/2022 3:59 AM   Critical care end time:  02/16/2022 5:00 AM   Critical care time was exclusive of:  Separately billable procedures and treating other patients   Critical care was necessary to treat or prevent imminent or life-threatening deterioration of the following conditions:  Respiratory failure   Critical care was time spent personally by me on the following activities:  Examination of patient, evaluation of patient's response to treatment, obtaining history from patient or surrogate, pulse oximetry, ordering and review of radiographic studies, ordering and review of laboratory studies, ordering and performing treatments and interventions and ventilator management   I assumed direction of critical care for this patient from another provider in my specialty: no       Medications Ordered in ED Medications  albuterol (VENTOLIN HFA) 108 (90 Base) MCG/ACT inhaler 2 puff (has no administration in time range)  albuterol (PROVENTIL,VENTOLIN) solution continuous neb (10 mg Nebulization Given 02/16/22 0323)    ED Course/ Medical Decision Making/ A&P Clinical Course as of 02/16/22 0638  Thu Feb 16, 2022  0355 WBC(!): 12.4 Leukocytosis  [DW]  0355 Pt now beginning to improve on bipap [DW]  0504 Pt feeling improved, taken off BiPap, wheezing improving, will monitor [DW]  0504 Glucose(!): 169 hyperglycemia [DW]  3149 Patient feels back to baseline.  He ambulated without difficulty.  He has never been intubated.  Wheezing is essentially resolved.  He feels comfortable for discharge home.  He reports he cannot afford Advair, but is working with his PCP to have this filled.  We will give him 5-day course of prednisone and an additional albuterol MDI [DW]  [phone] Patient is safe and appropriate for outpatient management [DW]    Clinical Course User Index [DW] Zadie Rhine, MD                           Medical Decision Making Amount and/or Complexity of Data  Reviewed Labs: ordered. Decision-making details documented in ED Course. Radiology: ordered. ECG/medicine tests: ordered.  Risk Prescription drug management.   This patient presents to the ED for concern of shortness of breath, this involves an extensive number of treatment options, and is a complaint that carries with it a high risk of complications and morbidity.  The differential diagnosis includes but is not limited to Acute coronary syndrome, pneumonia, acute pulmonary edema, pneumothorax, acute anemia, pulmonary embolism    Comorbidities that complicate the patient evaluation: Patient's presentation is complicated by their history of asthma  Social Determinants of Health: Patient's lack of insurance  increases the complexity of managing their presentation  Additional history obtained: Additional history obtained from EMS  Records reviewed previous admission documents  Lab Tests: I Ordered, and personally interpreted labs.  The pertinent results include: Leukocytosis  Imaging Studies ordered: I ordered imaging studies including X-ray chest   I independently visualized and interpreted imaging which showed no acute finding I agree with the radiologist interpretation  Cardiac Monitoring: The patient was maintained on a cardiac monitor.  I personally viewed  and interpreted the cardiac monitor which showed an underlying rhythm of:  sinus rhythm  Medicines ordered and prescription drug management: I ordered medication including nebulized therapy for wheezing Reevaluation of the patient after these medicines showed that the patient    improved   Critical Interventions:  Nebulizers and noninvasive ventilation  Reevaluation: After the interventions noted above, I reevaluated the patient and found that they have :improved  Complexity of problems addressed: Patient's presentation is most consistent with  acute presentation with potential threat to life or bodily  function  Disposition: After consideration of the diagnostic results and the patient's response to treatment,  I feel that the patent would benefit from discharge   .           Final Clinical Impression(s) / ED Diagnoses Final diagnoses:  Moderate persistent asthma with exacerbation    Rx / DC Orders ED Discharge Orders          Ordered    predniSONE (DELTASONE) 50 MG tablet  Daily with breakfast        02/16/22 0636              Zadie Rhine, MD 02/16/22 [phone]

## 2022-02-16 NOTE — ED Notes (Signed)
Trauma Event Note  Assisted with triage, primary/secondary assessment. Pt arrives with sudden onset shortness of breath, on EMS CPAP. Diaphoretic, tachycardic, increased WOB. Transitioned to BiPAP with improvement of work of breathing. Arrives with 18G IV in left forearm, with good blood return. Attempted secondary IV access x1 in right AC without success.   Last imported Vital Signs Pulse (!) 115   Temp (!) 97.2 F (36.2 C) (Oral)   Resp (!) 26   SpO2 100%    Jason Cardenas O Jason Cardenas  Trauma Response RN  Please call TRN at [phone] for further assistance.

## 2022-08-27 ENCOUNTER — Emergency Department (HOSPITAL_COMMUNITY): Payer: Self-pay

## 2022-08-27 ENCOUNTER — Emergency Department (HOSPITAL_COMMUNITY)
Admission: EM | Admit: 2022-08-27 | Discharge: 2022-08-27 | Disposition: A | Discharge status: Home / Self Care | Payer: Self-pay | Attending: Emergency Medicine | Admitting: Emergency Medicine

## 2022-08-27 ENCOUNTER — Encounter (HOSPITAL_COMMUNITY): Payer: Self-pay | Admitting: Emergency Medicine

## 2022-08-27 DIAGNOSIS — J4521 Mild intermittent asthma with (acute) exacerbation: Secondary | ICD-10-CM

## 2022-08-27 LAB — CBC WITH DIFFERENTIAL/PLATELET
Abs Immature Granulocytes: 0.03 10*3/uL (ref 0.00–0.07)
Basophils Absolute: 0.1 10*3/uL (ref 0.0–0.1)
Basophils Relative: 1 %
Eosinophils Absolute: 0.5 10*3/uL (ref 0.0–0.5)
Eosinophils Relative: 5 %
HCT: 48 % (ref 39.0–52.0)
Hemoglobin: 15.3 g/dL (ref 13.0–17.0)
Immature Granulocytes: 0 %
Lymphocytes Relative: 21 %
Lymphs Abs: 2.1 10*3/uL (ref 0.7–4.0)
MCH: 27.8 pg (ref 26.0–34.0)
MCHC: 31.9 g/dL (ref 30.0–36.0)
MCV: 87.3 fL (ref 80.0–100.0)
Monocytes Absolute: 0.6 10*3/uL (ref 0.1–1.0)
Monocytes Relative: 6 %
Neutro Abs: 6.6 10*3/uL (ref 1.7–7.7)
Neutrophils Relative %: 67 %
Platelets: 365 10*3/uL (ref 150–400)
RBC: 5.5 MIL/uL (ref 4.22–5.81)
RDW: 14.4 % (ref 11.5–15.5)
WBC: 9.8 10*3/uL (ref 4.0–10.5)
nRBC: 0 % (ref 0.0–0.2)

## 2022-08-27 LAB — BASIC METABOLIC PANEL
Anion gap: 8 (ref 5–15)
BUN: 14 mg/dL (ref 6–20)
CO2: 25 mmol/L (ref 22–32)
Calcium: 9.3 mg/dL (ref 8.9–10.3)
Chloride: 105 mmol/L (ref 98–111)
Creatinine, Ser: 1.08 mg/dL (ref 0.61–1.24)
GFR, Estimated: >60 mL/min (ref >60)
Glucose, Bld: 91 mg/dL (ref 70–99)
Potassium: 4.4 mmol/L (ref 3.5–5.1)
Sodium: 138 mmol/L (ref 135–145)

## 2022-08-27 MED ORDER — ALBUTEROL SULFATE HFA 108 (90 BASE) MCG/ACT IN AERS
2.0000 | INHALATION_SPRAY | Freq: Once | RESPIRATORY_TRACT | Status: AC
  Administered 2022-08-27: 2 via RESPIRATORY_TRACT
  Filled 2022-08-27: qty 6.7

## 2022-08-27 MED ORDER — PREDNISONE 20 MG PO TABS
60.0000 mg | ORAL_TABLET | Freq: Once | ORAL | Status: AC
  Administered 2022-08-27: 60 mg via ORAL
  Filled 2022-08-27: qty 3

## 2022-08-27 MED ORDER — ALBUTEROL SULFATE (2.5 MG/3ML) 0.083% IN NEBU
INHALATION_SOLUTION | RESPIRATORY_TRACT | Status: AC
  Filled 2022-08-27: qty 3

## 2022-08-27 MED ORDER — ALBUTEROL SULFATE (2.5 MG/3ML) 0.083% IN NEBU
2.5000 mg | INHALATION_SOLUTION | Freq: Once | RESPIRATORY_TRACT | Status: AC
  Administered 2022-08-27: 2.5 mg via RESPIRATORY_TRACT

## 2022-08-27 MED ORDER — PREDNISONE 20 MG PO TABS: ORAL_TABLET | ORAL | 0 refills | Status: DC

## 2022-08-27 MED ORDER — METHYLPREDNISOLONE 4 MG PO TBPK: ORAL_TABLET | ORAL | 0 refills | Status: DC

## 2022-08-27 NOTE — ED Provider Triage Note (Signed)
Emergency Medicine Provider Triage Evaluation Note  Jason Cardenas , a 62 y.o. male  was evaluated in triage.  Pt complains of asthma exacerbation since last night.  He states he has been wheezing and short of breath but no chest pain, abdominal pain, fever, cough or congestion, vomiting or diarrhea.  States this feels similar to previous asthma exacerbations.  He was hospitalized last year for the same.  No known sick contacts.  He used his inhaler until it was empty last night but has no more medication available at home.  Review of Systems  Positive: See HPI Negative: See HPI  Physical Exam  BP (!) 112/98 (BP Location: Left Arm)   Pulse 87   Temp 98 F (36.7 C) (Oral)   Resp 20   Ht 5\' 10"  (1.778 m)   Wt 83.5 kg   SpO2 100%   BMI 26.40 kg/m  Gen:   Awake, no distress   Resp:  Normal effort, minimal wheezing with no rales, rhonchi, or signs of respiratory distress, speaking in full sentences with ease, normal phonation MSK:   Moves extremities without difficulty no lower extremity edema Other:  Regular rate and rhythm, patient alert and oriented  Medical Decision Making  Medically screening exam initiated at 5:01 PM.  Appropriate orders placed.  Taji L Salemi was informed that the remainder of the evaluation will be completed by another provider, this initial triage assessment does not replace that evaluation, and the importance of remaining in the ED until their evaluation is complete.     Tonette Lederer, PA-C 08/27/22 1702

## 2022-08-27 NOTE — Discharge Instructions (Addendum)
You have a asthma exacerbation.  I have given you a prescription of prednisone and refilled your albuterol.  Can use albuterol every 4-6 hours as needed  See your doctor for follow-up  Return to ER if you have worse shortness of breath or cough or fever

## 2022-08-27 NOTE — ED Triage Notes (Signed)
Pt here from home with c/o sob that  started last night , pt has asthma and ran out of his inhaler ,

## 2022-08-27 NOTE — ED Provider Notes (Signed)
[facility] EMERGENCY DEPARTMENT AT [facility] Provider Note   CSN: [id number] Arrival date & time: 08/27/22  1522     History  Chief Complaint  Patient presents with   Shortness of Breath    Jason Cardenas is a 62 y.o. male history of asthma here presenting with shortness of breath.  Patient states that he ran out of his inhaler this morning.  He started having some shortness of breath.  He denies any fevers or chills.  He states that his last exacerbation was October last year.  The history is provided by the patient.       Home Medications Prior to Admission medications   Medication Sig Start Date End Date Taking? Authorizing Provider  albuterol (VENTOLIN HFA) 108 (90 Base) MCG/ACT inhaler Inhale 1-2 puffs into the lungs every 6 (six) hours as needed for wheezing or shortness of breath. 01/29/22   Rai, Delene Ruffini, MD  cetirizine (ZYRTEC ALLERGY) 10 MG tablet Take 1 tablet (10 mg total) by mouth daily as needed for allergies (Also available over-the-counter). 01/29/22   Rai, Ripudeep K, MD  Fluticasone-Salmeterol (ADVAIR) 250-50 MCG/DOSE AEPB Inhale 1 puff into the lungs daily. 02/05/22   Molpus, John, MD  predniSONE (DELTASONE) 50 MG tablet Take 1 tablet (50 mg total) by mouth daily with breakfast. 02/16/22   Zadie Rhine, MD      Allergies    Patient has no known allergies.    Review of Systems   Review of Systems  Respiratory:  Positive for shortness of breath.   All other systems reviewed and are negative.   Physical Exam Updated Vital Signs BP (!) 112/98 (BP Location: Left Arm)   Pulse 87   Temp 98 F (36.7 C) (Oral)   Resp 20   Ht 5\' 10"  (1.778 m)   Wt 83.5 kg   SpO2 100%   BMI 26.40 kg/m  Physical Exam Vitals and nursing note reviewed.  Constitutional:      Comments: Well-appearing and slightly tachypneic  HENT:     Head: Normocephalic.     Mouth/Throat:     Mouth: Mucous membranes are moist.  Eyes:     Extraocular Movements:  Extraocular movements intact.     Pupils: Pupils are equal, round, and reactive to light.  Cardiovascular:     Rate and Rhythm: Normal rate and regular rhythm.  Pulmonary:     Comments: Slightly tachypneic and minimal wheezing Abdominal:     General: Bowel sounds are normal.     Palpations: Abdomen is soft.  Musculoskeletal:        General: Normal range of motion.     Cervical back: Normal range of motion and neck supple.  Skin:    General: Skin is warm.     Capillary Refill: Capillary refill takes less than 2 seconds.  Neurological:     General: No focal deficit present.     Mental Status: He is alert and oriented to person, place, and time.  Psychiatric:        Mood and Affect: Mood normal.        Behavior: Behavior normal.     ED Results / Procedures / Treatments   Labs (all labs ordered are listed, but only abnormal results are displayed) Labs Reviewed  BASIC METABOLIC PANEL  CBC WITH DIFFERENTIAL/PLATELET    EKG EKG Interpretation  Date/Time:  Sunday Aug 27 2022 16:27:38 EDT Ventricular Rate:  93 PR Interval:  186 QRS Duration: 74 QT Interval:  360 QTC Calculation: 447 R Axis:   79 Text Interpretation: Normal sinus rhythm Normal ECG When compared with ECG of 16-Feb-2022 02:57, No significant change since last tracing Confirmed by Richardean Canal [phone]) on 08/27/2022 6:34:04 PM  Radiology DG Chest 2 View  Result Date: 08/27/2022 CLINICAL DATA:  Shortness of breath EXAM: CHEST - 2 VIEW COMPARISON:  Chest x-ray dated February 16, 2022 FINDINGS: The heart size and mediastinal contours are within normal limits. Both lungs are clear. The visualized skeletal structures are unremarkable. IMPRESSION: No active cardiopulmonary disease. Electronically Signed   By: Allegra Lai M.D.   On: 08/27/2022 17:27    Procedures Procedures    Medications Ordered in ED Medications  albuterol (VENTOLIN HFA) 108 (90 Base) MCG/ACT inhaler 2 puff (has no administration in time range)   predniSONE (DELTASONE) tablet 60 mg (has no administration in time range)  albuterol (PROVENTIL) (2.5 MG/3ML) 0.083% nebulizer solution 2.5 mg (2.5 mg Nebulization Given 08/27/22 1637)    ED Course/ Medical Decision Making/ A&P                             Medical Decision Making Kerim L Illes is a 62 y.o. male here presenting with cough and mild asthma exacerbation.  Consider pneumonia as well.  Plan to get CBC and BMP and chest x-ray.  Will give albuterol and prednisone and reassess  7:02 PM I reviewed patient's labs and independently interpreted chest x-ray.  Labs and chest x-ray unremarkable.  He is feeling better after albuterol.  Will give a course of prednisone and refill his albuterol.  Problems Addressed: Mild intermittent asthma with exacerbation: acute illness or injury  Amount and/or Complexity of Data Reviewed Labs: ordered. Decision-making details documented in ED Course. Radiology: ordered and independent interpretation performed. Decision-making details documented in ED Course.  Risk Prescription drug management.    Final Clinical Impression(s) / ED Diagnoses Final diagnoses:  None    Rx / DC Orders ED Discharge Orders     None         Charlynne Pander, MD 08/27/22 [phone]

## 2022-09-08 ENCOUNTER — Inpatient Hospital Stay (HOSPITAL_COMMUNITY)
Admission: EM | Admit: 2022-09-08 | Discharge: 2022-09-10 | DRG: 917 | Disposition: A | Discharge status: Home / Self Care | Payer: PRIVATE HEALTH INSURANCE | Attending: Internal Medicine | Admitting: Internal Medicine

## 2022-09-08 ENCOUNTER — Emergency Department (HOSPITAL_COMMUNITY): Payer: PRIVATE HEALTH INSURANCE

## 2022-09-08 ENCOUNTER — Other Ambulatory Visit: Payer: Self-pay

## 2022-09-08 DIAGNOSIS — F1210 Cannabis abuse, uncomplicated: Secondary | ICD-10-CM | POA: Diagnosis present

## 2022-09-08 DIAGNOSIS — E872 Acidosis, unspecified: Secondary | ICD-10-CM | POA: Diagnosis present

## 2022-09-08 DIAGNOSIS — J9601 Acute respiratory failure with hypoxia: Secondary | ICD-10-CM | POA: Diagnosis present

## 2022-09-08 DIAGNOSIS — F1410 Cocaine abuse, uncomplicated: Secondary | ICD-10-CM | POA: Diagnosis present

## 2022-09-08 DIAGNOSIS — T380X5A Adverse effect of glucocorticoids and synthetic analogues, initial encounter: Secondary | ICD-10-CM | POA: Diagnosis present

## 2022-09-08 DIAGNOSIS — E440 Moderate protein-calorie malnutrition: Secondary | ICD-10-CM | POA: Diagnosis present

## 2022-09-08 DIAGNOSIS — E8729 Other acidosis: Secondary | ICD-10-CM | POA: Diagnosis present

## 2022-09-08 DIAGNOSIS — J45909 Unspecified asthma, uncomplicated: Secondary | ICD-10-CM | POA: Diagnosis present

## 2022-09-08 DIAGNOSIS — Z91199 Patient's noncompliance with other medical treatment and regimen due to unspecified reason: Secondary | ICD-10-CM

## 2022-09-08 DIAGNOSIS — G9341 Metabolic encephalopathy: Secondary | ICD-10-CM | POA: Diagnosis present

## 2022-09-08 DIAGNOSIS — Z6823 Body mass index (BMI) 23.0-23.9, adult: Secondary | ICD-10-CM | POA: Diagnosis not present

## 2022-09-08 DIAGNOSIS — I10 Essential (primary) hypertension: Secondary | ICD-10-CM | POA: Diagnosis present

## 2022-09-08 DIAGNOSIS — D72829 Elevated white blood cell count, unspecified: Secondary | ICD-10-CM | POA: Diagnosis present

## 2022-09-08 DIAGNOSIS — J9602 Acute respiratory failure with hypercapnia: Secondary | ICD-10-CM | POA: Diagnosis present

## 2022-09-08 DIAGNOSIS — G928 Other toxic encephalopathy: Secondary | ICD-10-CM | POA: Diagnosis present

## 2022-09-08 DIAGNOSIS — T405X1A Poisoning by cocaine, accidental (unintentional), initial encounter: Principal | ICD-10-CM | POA: Diagnosis present

## 2022-09-08 DIAGNOSIS — F1910 Other psychoactive substance abuse, uncomplicated: Secondary | ICD-10-CM | POA: Diagnosis present

## 2022-09-08 LAB — COMPREHENSIVE METABOLIC PANEL
ALT: 21 U/L (ref 0–44)
AST: 24 U/L (ref 15–41)
Albumin: 4 g/dL (ref 3.5–5.0)
Alkaline Phosphatase: 58 U/L (ref 38–126)
Anion gap: 11 (ref 5–15)
BUN: 18 mg/dL (ref 6–20)
CO2: 24 mmol/L (ref 22–32)
Calcium: 8.8 mg/dL — ABNORMAL LOW (ref 8.9–10.3)
Chloride: 102 mmol/L (ref 98–111)
Creatinine, Ser: 1.32 mg/dL — ABNORMAL HIGH (ref 0.61–1.24)
GFR, Estimated: >60 mL/min (ref >60)
Glucose, Bld: 262 mg/dL — ABNORMAL HIGH (ref 70–99)
Potassium: 4.7 mmol/L (ref 3.5–5.1)
Sodium: 137 mmol/L (ref 135–145)
Total Bilirubin: 0.9 mg/dL (ref 0.3–1.2)
Total Protein: 6.9 g/dL (ref 6.5–8.1)

## 2022-09-08 LAB — RESPIRATORY PANEL BY PCR

## 2022-09-08 LAB — I-STAT VENOUS BLOOD GAS, ED
Acid-base deficit: 4 mmol/L — ABNORMAL HIGH (ref 0.0–2.0)
Bicarbonate: 27 mmol/L (ref 20.0–28.0)
Calcium, Ion: 1.18 mmol/L (ref 1.15–1.40)
HCT: 47 % (ref 39.0–52.0)
Hemoglobin: 16 g/dL (ref 13.0–17.0)
O2 Saturation: 99 %
Potassium: 4.8 mmol/L (ref 3.5–5.1)
Sodium: 139 mmol/L (ref 135–145)
TCO2: 29 mmol/L (ref 22–32)
pCO2, Ven: 76.6 mmHg (ref 44–60)
pH, Ven: 7.155 — CL (ref 7.25–7.43)
pO2, Ven: 166 mmHg — ABNORMAL HIGH (ref 32–45)

## 2022-09-08 LAB — I-STAT ARTERIAL BLOOD GAS, ED
Acid-base deficit: 4 mmol/L — ABNORMAL HIGH (ref 0.0–2.0)
Bicarbonate: 24.3 mmol/L (ref 20.0–28.0)
Calcium, Ion: 1.17 mmol/L (ref 1.15–1.40)
HCT: 47 % (ref 39.0–52.0)
Hemoglobin: 16 g/dL (ref 13.0–17.0)
O2 Saturation: 100 %
Patient temperature: 98.1
Potassium: 4.5 mmol/L (ref 3.5–5.1)
Sodium: 137 mmol/L (ref 135–145)
TCO2: 26 mmol/L (ref 22–32)
pCO2 arterial: 52.6 mmHg — ABNORMAL HIGH (ref 32–48)
pH, Arterial: 7.271 — ABNORMAL LOW (ref 7.35–7.45)
pO2, Arterial: 526 mmHg — ABNORMAL HIGH (ref 83–108)

## 2022-09-08 LAB — CBC
HCT: 45.9 % (ref 39.0–52.0)
HCT: 45.9 % (ref 39.0–52.0)
Hemoglobin: 14.8 g/dL (ref 13.0–17.0)
Hemoglobin: 14.9 g/dL (ref 13.0–17.0)
MCH: 27.5 pg (ref 26.0–34.0)
MCH: 27.9 pg (ref 26.0–34.0)
MCHC: 32.2 g/dL (ref 30.0–36.0)
MCHC: 32.5 g/dL (ref 30.0–36.0)
MCV: 84.7 fL (ref 80.0–100.0)
MCV: 86.6 fL (ref 80.0–100.0)
Platelets: 281 10*3/uL (ref 150–400)
Platelets: 402 10*3/uL — ABNORMAL HIGH (ref 150–400)
RBC: 5.3 MIL/uL (ref 4.22–5.81)
RBC: 5.42 MIL/uL (ref 4.22–5.81)
RDW: 14.4 % (ref 11.5–15.5)
RDW: 14.6 % (ref 11.5–15.5)
WBC: 14.9 10*3/uL — ABNORMAL HIGH (ref 4.0–10.5)
WBC: 22.3 10*3/uL — ABNORMAL HIGH (ref 4.0–10.5)
nRBC: 0 % (ref 0.0–0.2)
nRBC: 0 % (ref 0.0–0.2)

## 2022-09-08 LAB — BASIC METABOLIC PANEL
Anion gap: 13 (ref 5–15)
BUN: 15 mg/dL (ref 6–20)
CO2: 22 mmol/L (ref 22–32)
Calcium: 8.9 mg/dL (ref 8.9–10.3)
Chloride: 105 mmol/L (ref 98–111)
Creatinine, Ser: 1.12 mg/dL (ref 0.61–1.24)
GFR, Estimated: >60 mL/min (ref >60)
Glucose, Bld: 109 mg/dL — ABNORMAL HIGH (ref 70–99)
Potassium: 4.2 mmol/L (ref 3.5–5.1)
Sodium: 140 mmol/L (ref 135–145)

## 2022-09-08 LAB — MAGNESIUM
Magnesium: 2.5 mg/dL — ABNORMAL HIGH (ref 1.7–2.4)
Magnesium: 2.5 mg/dL — ABNORMAL HIGH (ref 1.7–2.4)

## 2022-09-08 LAB — GLUCOSE, CAPILLARY
Glucose-Capillary: 144 mg/dL — ABNORMAL HIGH (ref 70–99)
Glucose-Capillary: 94 mg/dL (ref 70–99)
Glucose-Capillary: 135 mg/dL — ABNORMAL HIGH (ref 70–99)
Glucose-Capillary: 134 mg/dL — ABNORMAL HIGH (ref 70–99)
Glucose-Capillary: 109 mg/dL — ABNORMAL HIGH (ref 70–99)
Glucose-Capillary: 105 mg/dL — ABNORMAL HIGH (ref 70–99)

## 2022-09-08 LAB — URINALYSIS, COMPLETE (UACMP) WITH MICROSCOPIC
Bilirubin Urine: NEGATIVE
Glucose, UA: NEGATIVE mg/dL
Ketones, ur: NEGATIVE mg/dL
Nitrite: NEGATIVE
Protein, ur: NEGATIVE mg/dL
RBC / HPF: >50 RBC/hpf (ref 0–5)
Specific Gravity, Urine: >1.046 — ABNORMAL HIGH (ref 1.005–1.030)
pH: 5 (ref 5.0–8.0)

## 2022-09-08 LAB — LACTIC ACID, PLASMA: Lactic Acid, Venous: 1.3 mmol/L (ref 0.5–1.9)

## 2022-09-08 LAB — RAPID URINE DRUG SCREEN, HOSP PERFORMED
Amphetamines: NOT DETECTED
Barbiturates: NOT DETECTED
Benzodiazepines: POSITIVE — AB
Cocaine: POSITIVE — AB
Opiates: NOT DETECTED
Tetrahydrocannabinol: POSITIVE — AB

## 2022-09-08 LAB — TROPONIN I (HIGH SENSITIVITY)
Troponin I (High Sensitivity): 7 ng/L (ref <18)
Troponin I (High Sensitivity): 10 ng/L (ref <18)

## 2022-09-08 LAB — PHOSPHORUS
Phosphorus: 3.1 mg/dL (ref 2.5–4.6)
Phosphorus: 3.6 mg/dL (ref 2.5–4.6)

## 2022-09-08 LAB — CK: Total CK: 324 U/L (ref 49–397)

## 2022-09-08 LAB — I-STAT CHEM 8, ED
BUN: 26 mg/dL — ABNORMAL HIGH (ref 6–20)
Calcium, Ion: 1.19 mmol/L (ref 1.15–1.40)
Chloride: 104 mmol/L (ref 98–111)
Creatinine, Ser: 1.2 mg/dL (ref 0.61–1.24)
Glucose, Bld: 268 mg/dL — ABNORMAL HIGH (ref 70–99)
HCT: 47 % (ref 39.0–52.0)
Hemoglobin: 16 g/dL (ref 13.0–17.0)
Potassium: 4.8 mmol/L (ref 3.5–5.1)
Sodium: 141 mmol/L (ref 135–145)
TCO2: 29 mmol/L (ref 22–32)

## 2022-09-08 LAB — HIV ANTIBODY (ROUTINE TESTING W REFLEX): HIV Screen 4th Generation wRfx: NONREACTIVE

## 2022-09-08 LAB — MRSA NEXT GEN BY PCR, NASAL: MRSA by PCR Next Gen: NOT DETECTED

## 2022-09-08 LAB — PROTIME-INR
INR: 1.1 (ref 0.8–1.2)
Prothrombin Time: 14.3 seconds (ref 11.4–15.2)

## 2022-09-08 LAB — ETHANOL: Alcohol, Ethyl (B): <10 mg/dL (ref <10)

## 2022-09-08 MED ORDER — CHLORHEXIDINE GLUCONATE CLOTH 2 % EX PADS
6.0000 | MEDICATED_PAD | Freq: Every day | CUTANEOUS | Status: DC
  Administered 2022-09-08 (×2): 6 via TOPICAL

## 2022-09-08 MED ORDER — IPRATROPIUM-ALBUTEROL 0.5-2.5 (3) MG/3ML IN SOLN
3.0000 mL | Freq: Four times a day (QID) | RESPIRATORY_TRACT | Status: DC
  Administered 2022-09-09 (×3): 3 mL via RESPIRATORY_TRACT
  Filled 2022-09-08 (×3): qty 3

## 2022-09-08 MED ORDER — FENTANYL 2500MCG IN NS 250ML (10MCG/ML) PREMIX INFUSION
50.0000 ug/h | INTRAVENOUS | Status: DC
  Administered 2022-09-08: 60 ug/h via INTRAVENOUS

## 2022-09-08 MED ORDER — KETAMINE HCL 50 MG/5ML IJ SOSY
PREFILLED_SYRINGE | INTRAMUSCULAR | Status: AC
  Filled 2022-09-08: qty 10

## 2022-09-08 MED ORDER — IPRATROPIUM-ALBUTEROL 0.5-2.5 (3) MG/3ML IN SOLN
3.0000 mL | RESPIRATORY_TRACT | Status: DC
  Administered 2022-09-08 (×5): 3 mL via RESPIRATORY_TRACT
  Filled 2022-09-08 (×5): qty 3

## 2022-09-08 MED ORDER — ADULT MULTIVITAMIN W/MINERALS CH
1.0000 | ORAL_TABLET | Freq: Every day | ORAL | Status: DC
  Administered 2022-09-09 – 2022-09-10 (×2): 1
  Filled 2022-09-08 (×2): qty 1

## 2022-09-08 MED ORDER — METHYLPREDNISOLONE SODIUM SUCC 125 MG IJ SOLR
60.0000 mg | Freq: Every day | INTRAMUSCULAR | Status: DC
  Administered 2022-09-08: 60 mg via INTRAVENOUS
  Filled 2022-09-08: qty 2

## 2022-09-08 MED ORDER — THIAMINE MONONITRATE 100 MG PO TABS
100.0000 mg | ORAL_TABLET | Freq: Every day | ORAL | Status: DC
  Administered 2022-09-09 – 2022-09-10 (×2): 100 mg
  Filled 2022-09-08 (×2): qty 1

## 2022-09-08 MED ORDER — OSMOLITE 1.5 CAL PO LIQD: 1000.0000 mL | ORAL | Status: DC

## 2022-09-08 MED ORDER — ORAL CARE MOUTH RINSE
15.0000 mL | OROMUCOSAL | Status: DC
  Administered 2022-09-08 (×8): 15 mL via OROMUCOSAL

## 2022-09-08 MED ORDER — LORAZEPAM 2 MG/ML IJ SOLN
2.0000 mg | Freq: Once | INTRAMUSCULAR | Status: AC
  Administered 2022-09-08: 2 mg via INTRAVENOUS
  Filled 2022-09-08: qty 1

## 2022-09-08 MED ORDER — PROPOFOL 1000 MG/100ML IV EMUL
INTRAVENOUS | Status: AC
  Administered 2022-09-08: 10 ug/kg/min via INTRAVENOUS
  Filled 2022-09-08: qty 100

## 2022-09-08 MED ORDER — PROPOFOL 1000 MG/100ML IV EMUL
5.0000 ug/kg/min | INTRAVENOUS | Status: DC
  Administered 2022-09-08: 40 ug/kg/min via INTRAVENOUS

## 2022-09-08 MED ORDER — DEXMEDETOMIDINE HCL IN NACL 400 MCG/100ML IV SOLN
0.0000 ug/kg/h | INTRAVENOUS | Status: DC
  Administered 2022-09-08: 1.2 ug/kg/h via INTRAVENOUS
  Filled 2022-09-08: qty 100

## 2022-09-08 MED ORDER — ENOXAPARIN SODIUM 40 MG/0.4ML IJ SOSY
40.0000 mg | PREFILLED_SYRINGE | INTRAMUSCULAR | Status: DC
  Administered 2022-09-08 – 2022-09-10 (×3): 40 mg via SUBCUTANEOUS
  Filled 2022-09-08 (×3): qty 0.4

## 2022-09-08 MED ORDER — ALBUTEROL SULFATE (2.5 MG/3ML) 0.083% IN NEBU
2.5000 mg | INHALATION_SOLUTION | RESPIRATORY_TRACT | Status: DC | PRN
  Administered 2022-09-10: 2.5 mg via RESPIRATORY_TRACT
  Filled 2022-09-08: qty 3

## 2022-09-08 MED ORDER — MIDAZOLAM HCL 2 MG/2ML IJ SOLN: 2.0000 mg | INTRAMUSCULAR | Status: DC | PRN

## 2022-09-08 MED ORDER — FENTANYL CITRATE PF 50 MCG/ML IJ SOSY: 50.0000 ug | PREFILLED_SYRINGE | Freq: Once | INTRAMUSCULAR | Status: DC

## 2022-09-08 MED ORDER — FENTANYL 2500MCG IN NS 250ML (10MCG/ML) PREMIX INFUSION
0.0000 ug/h | INTRAVENOUS | Status: DC
  Administered 2022-09-08: 25 ug/h via INTRAVENOUS
  Administered 2022-09-08: 100 ug/h via INTRAVENOUS
  Filled 2022-09-08: qty 250

## 2022-09-08 MED ORDER — MIDAZOLAM HCL 2 MG/2ML IJ SOLN
1.0000 mg | INTRAMUSCULAR | Status: DC | PRN
  Filled 2022-09-08: qty 2

## 2022-09-08 MED ORDER — IOHEXOL 350 MG/ML SOLN
75.0000 mL | Freq: Once | INTRAVENOUS | Status: AC | PRN
  Administered 2022-09-08: 75 mL via INTRAVENOUS

## 2022-09-08 MED ORDER — DOCUSATE SODIUM 50 MG/5ML PO LIQD: 100.0000 mg | Freq: Two times a day (BID) | ORAL | Status: DC | PRN

## 2022-09-08 MED ORDER — PROPOFOL 1000 MG/100ML IV EMUL
0.0000 ug/kg/min | INTRAVENOUS | Status: DC
  Administered 2022-09-08 (×2): 40 ug/kg/min via INTRAVENOUS
  Filled 2022-09-08: qty 100

## 2022-09-08 MED ORDER — POLYETHYLENE GLYCOL 3350 17 G PO PACK
17.0000 g | PACK | Freq: Every day | ORAL | Status: DC
  Administered 2022-09-08 – 2022-09-10 (×3): 17 g
  Filled 2022-09-08 (×3): qty 1

## 2022-09-08 MED ORDER — FAMOTIDINE 20 MG PO TABS
20.0000 mg | ORAL_TABLET | Freq: Two times a day (BID) | ORAL | Status: DC
  Administered 2022-09-08 – 2022-09-09 (×3): 20 mg
  Filled 2022-09-08 (×3): qty 1

## 2022-09-08 MED ORDER — ORAL CARE MOUTH RINSE: 15.0000 mL | OROMUCOSAL | Status: DC | PRN

## 2022-09-08 MED ORDER — PROSOURCE TF20 ENFIT COMPATIBL EN LIQD: 60.0000 mL | Freq: Every day | ENTERAL | Status: DC

## 2022-09-08 MED ORDER — LACTATED RINGERS IV BOLUS
500.0000 mL | Freq: Once | INTRAVENOUS | Status: AC
  Administered 2022-09-08: 500 mL via INTRAVENOUS

## 2022-09-08 MED ORDER — ALBUTEROL SULFATE (2.5 MG/3ML) 0.083% IN NEBU
10.0000 mg | INHALATION_SOLUTION | Freq: Once | RESPIRATORY_TRACT | Status: AC
  Administered 2022-09-08: 10 mg via RESPIRATORY_TRACT
  Filled 2022-09-08: qty 12

## 2022-09-08 MED ORDER — DOCUSATE SODIUM 50 MG/5ML PO LIQD
100.0000 mg | Freq: Two times a day (BID) | ORAL | Status: DC
  Administered 2022-09-08 – 2022-09-10 (×5): 100 mg
  Filled 2022-09-08 (×5): qty 10

## 2022-09-08 MED ORDER — FENTANYL BOLUS VIA INFUSION
50.0000 ug | INTRAVENOUS | Status: DC | PRN
  Administered 2022-09-08 (×2): 50 ug via INTRAVENOUS

## 2022-09-08 MED ORDER — MAGNESIUM SULFATE 2 GM/50ML IV SOLN
2.0000 g | Freq: Once | INTRAVENOUS | Status: AC
  Administered 2022-09-08: 2 g via INTRAVENOUS
  Filled 2022-09-08: qty 50

## 2022-09-08 MED ORDER — SODIUM CHLORIDE 0.9 % IV BOLUS
1000.0000 mL | Freq: Once | INTRAVENOUS | Status: AC
  Administered 2022-09-08: 1000 mL via INTRAVENOUS

## 2022-09-08 MED ORDER — POLYETHYLENE GLYCOL 3350 17 G PO PACK: 17.0000 g | PACK | Freq: Every day | ORAL | Status: DC | PRN

## 2022-09-08 NOTE — ED Notes (Signed)
20 mg etomidate, 100 mg of rocuronium

## 2022-09-08 NOTE — Progress Notes (Signed)
NAMEQwinton Cardenas, MRN:  [id number], DOB:  [birth year], LOS: 0 ADMISSION DATE:  09/08/2022, CONSULTATION DATE:  09/08/22 REFERRING MD:  Pilar Plate, CHIEF COMPLAINT:  agitation   History of Present Illness:  61yM with history of asthma/AR and multiple ED visits for bronchospasm whose friends called EMS earlier tonight for severe shortness of breath. EMS noted facial/lip cyanosis, wheezing, severe distress and agitation limiting use of supplemental O2. He was given versed, epi pen, solumedrol in the field. Here similarly with acute hypoxic hypercapnic respiratory failure and agitation requiring intubation.    In ED he was also given 1L NS, started on fentanyl, propofol.   Pertinent  Medical History  Asthma AR  Significant Hospital Events: Including procedures, antibiotic start and stop dates in addition to other pertinent events   09/08/22 intubated, admitted to ICU   Interim History / Subjective:  Remains intubated. Requiring deep sedation Per RN was awake and following commands on 50 prop, but was apneic on SBT (on prop) then became agitated and attempted to self extubate. Sedated.    Objective   Blood pressure 124/76, pulse 72, temperature 97.9 F (36.6 C), temperature source Axillary, resp. rate 18, weight 75 kg, SpO2 100 %.    Vent Mode: PRVC FiO2 (%):  [40 %-100 %] 40 % Set Rate:  [18 bmp] 18 bmp Vt Set:  [620 mL] 620 mL PEEP:  [5 cmH20] 5 cmH20 Plateau Pressure:  [14 cmH20-18 cmH20] 15 cmH20   Intake/Output Summary (Last 24 hours) at 09/08/2022 0910 Last data filed at 09/08/2022 0800 Gross per 24 hour  Intake 232.22 ml  Output 100 ml  Net 132.22 ml   Filed Weights   09/08/22 0042 09/08/22 0442  Weight: 77.1 kg 75 kg    Examination: General:  middle aged male if normal body habitus Neuro:  RASS -3 HEENT:  /AT, PERRL, No JVD Cardiovascular:  RRR, no MRG Lungs:  Clear bilateral breath sounds. No wheeze Abdomen:  Soft, non-distended, hyperactive Musculoskeletal:  No  acute deformity  Skin:  Intact, MMM  CTA 5/17: no PE. No acute disease. Scarring anteriorly in the RUL.  CT head 5/17: negative   Resolved Hospital Problem list    Assessment & Plan:   # Acute hypoxic hypercapnic respiratory failure Possibly developing bronchospasm in setting THC, cocaine use. Limited extent of ggo/scar RUL. - trach aspirate  pending - lung protective ventilation - sedate for rass -1 to -2 - solumedrol 60 mg daily - duoneb q4, albuterol prn - Propofol and fentanyl - Add Precedex to facilitate weaning. Wean prop if possible.   # Acute toxic and metabolic encephalopathy - supportive care as above  # Leukocytosis Stress/steroid exposure - trend cbc  Best Practice (right click and "Reselect all SmartList Selections" daily)   Diet/type: NPO w/ meds via tube DVT prophylaxis: LMWH GI prophylaxis: H2B Lines: N/A Foley:  N/A Code Status:  full code Last date of multidisciplinary goals of care discussion [Mother updated]  Labs   CBC: Recent Labs  Lab 09/08/22 0042 09/08/22 0101 09/08/22 0102 09/08/22 0201 09/08/22 0734  WBC 22.3*  --   --   --  14.9*  HGB 14.8 16.0 16.0 16.0 14.9  HCT 45.9 47.0 47.0 47.0 45.9  MCV 86.6  --   --   --  84.7  PLT 402*  --   --   --  281     Basic Metabolic Panel: Recent Labs  Lab 09/08/22 0042 09/08/22 0101 09/08/22 0102 09/08/22 0201  NA  137 141 139 137  K 4.7 4.8 4.8 4.5  CL 102 104  --   --   CO2 24  --   --   --   GLUCOSE 262* 268*  --   --   BUN 18 26*  --   --   CREATININE 1.32* 1.20  --   --   CALCIUM 8.8*  --   --   --     GFR: CrCl cannot be calculated (Unknown ideal weight.). Recent Labs  Lab 09/08/22 0042 09/08/22 0734  WBC 22.3* 14.9*  LATICACIDVEN 1.3  --      Liver Function Tests: Recent Labs  Lab 09/08/22 0042  AST 24  ALT 21  ALKPHOS 58  BILITOT 0.9  PROT 6.9  ALBUMIN 4.0    No results for input(s): "LIPASE", "AMYLASE" in the last 168 hours. No results for input(s):  "AMMONIA" in the last 168 hours.  ABG    Component Value Date/Time   PHART 7.271 (L) 09/08/2022 0201   PCO2ART 52.6 (H) 09/08/2022 0201   PO2ART 526 (H) 09/08/2022 0201   HCO3 24.3 09/08/2022 0201   TCO2 26 09/08/2022 0201   ACIDBASEDEF 4.0 (H) 09/08/2022 0201   O2SAT 100 09/08/2022 0201     Coagulation Profile: Recent Labs  Lab 09/08/22 0042  INR 1.1     Cardiac Enzymes: No results for input(s): "CKTOTAL", "CKMB", "CKMBINDEX", "TROPONINI" in the last 168 hours.  HbA1C: No results found for: "HGBA1C"  CBG: Recent Labs  Lab 09/08/22 0422 09/08/22 0736  GLUCAP 144* 94    Review of Systems:   Unable to obtain, intubated  Past Medical History:  He,  has no past medical history on file.   Surgical History:    Social History:      Family History:  His family history is not on file.   Allergies No Known Allergies   Home Medications  Prior to Admission medications   Not on File     Critical care time: 33 minutes     Joneen Roach, AGACNP-BC  Pulmonary & Critical Care  See Amion for personal pager PCCM on call pager [phone] until 7pm. Please call Elink 7p-7a. [phone]  09/08/2022 9:25 AM

## 2022-09-08 NOTE — Progress Notes (Addendum)
Attempted SBT this AM and failed. Sedated decreased. Pt  following commands.

## 2022-09-08 NOTE — Progress Notes (Signed)
eLink Physician-Brief Progress Note Patient Name: Jason Cardenas DOB: Dec 27, 1960 MRN: [id number]   Date of Service  09/08/2022  HPI/Events of Note  Patient admitted with acute hypoxemic respiratory failure secondary to severe reactive airways disease that required intubation and mechanical ventilation.  eICU Interventions  New Patient Evaluation.        Jason Cardenas Jason Cardenas 09/08/2022, 4:55 AM

## 2022-09-08 NOTE — ED Notes (Signed)
Discussed abnormal VS HR and BP with EDP bero. No additional orders at this time

## 2022-09-08 NOTE — ED Triage Notes (Addendum)
Patient BIB [facility] c/o respiratory distress, altered mental status and being combative.  EMS gave 5mg  of versed, IM epi, mag sulfate, and solu-medrol.  Patient being assisted with ventilations upon arrival, patient remains combative.  150/79 229 CBG 79%-99% with NPA and BVM 130 HR Bilateral 20 g IV

## 2022-09-08 NOTE — Procedures (Signed)
Extubation Procedure Note  Patient Details:   Name: Jason Cardenas DOB: Mar 23, 1961 MRN: [id number]   Airway Documentation:    Vent end date: 09/08/22 Vent end time: 1345   Evaluation  O2 sats: stable throughout Complications: No apparent complications Patient did tolerate procedure well. Bilateral Breath Sounds: Clear   Yes  Pt was extubated per MD and placed on 2 L [place]. Cuff leak noted prior to extubation and no stridor post. Pt is stable at this time. RT will monitor.  Merlene Laughter 09/08/2022, 1:47 PM

## 2022-09-08 NOTE — ED Notes (Signed)
Pt agitation continues despite sedation medications given see mar. Bilateral soft wrist restraints applied at this time. EDP at bedside

## 2022-09-08 NOTE — ED Provider Notes (Signed)
[facility] Emergency Department Provider Note MRN:  [id number]  Arrival date & time: 09/08/22     Chief Complaint   Respiratory Distress   History of Present Illness   Jason Cardenas is a 62 y.o. year-old male with a history of asthma presenting to the ED with chief complaint of respiratory distress.  EMS called by patient's friends who witnessed him developing severe shortness of breath, seem consistent with his known asthma.  Per EMS he was cyanotic in the lips/face, severe distress to the point of combativeness, swatting away attempted medical care, oxygen.  Saturations in the 70s.  Provided with Versed to facilitate care in the field.  Given EpiPen, mag, Solu-Medrol.  Review of Systems  I was unable to obtain a full/accurate HPI, PMH, or ROS due to the patient's respiratory failure.  Patient's Health History   No past medical history on file.    No family history on file.  Social History   Socioeconomic History   Marital status: Unknown    Spouse name: Not on file   Number of children: Not on file   Years of education: Not on file   Highest education level: Not on file  Occupational History   Not on file  Tobacco Use   Smoking status: Not on file   Smokeless tobacco: Not on file  Substance and Sexual Activity   Alcohol use: Not on file   Drug use: Not on file   Sexual activity: Not on file  Other Topics Concern   Not on file  Social History Narrative   Not on file   Social Determinants of Health   Financial Resource Strain: Not on file  Food Insecurity: Not on file  Transportation Needs: Not on file  Physical Activity: Not on file  Stress: Not on file  Social Connections: Not on file  Intimate Partner Violence: Not on file     Physical Exam   Vitals:   09/08/22 0150 09/08/22 0233  BP:    Pulse: (!) 127   Resp: (!) 30   Temp:    SpO2: 100% 100%    CONSTITUTIONAL: Ill-appearing, severe respiratory distress NEURO/PSYCH:  Altered, combative, moving all extremities EYES:  eyes equal and reactive ENT/NECK:  no LAD, no JVD CARDIO: Tachycardic rate, well-perfused, normal S1 and S2 PULM: Poor air movement, faint wheezing GI/GU:  non-distended, non-tender MSK/SPINE:  No gross deformities, no edema SKIN:  no rash, atraumatic, diaphoretic   *Additional and/or pertinent findings included in MDM below  Diagnostic and Interventional Summary    EKG Interpretation  Date/Time:  Friday Sep 08 2022 00:43:45 EDT Ventricular Rate:  140 PR Interval:  129 QRS Duration: 89 QT Interval:  294 QTC Calculation: 449 R Axis:   86 Text Interpretation: Sinus tachycardia Multiform ventricular premature complexes Aberrant complex Borderline right axis deviation Probable anteroseptal infarct, old Baseline wander in lead(s) V2 Partial missing lead(s): V2 Confirmed by Kennis Carina [phone]) on 09/08/2022 1:01:42 AM       Labs Reviewed  CBC - Abnormal; Notable for the following components:      Result Value   WBC 22.3 (*)    Platelets 402 (*)    All other components within normal limits  COMPREHENSIVE METABOLIC PANEL - Abnormal; Notable for the following components:   Glucose, Bld 262 (*)    Creatinine, Ser 1.32 (*)    Calcium 8.8 (*)    All other components within normal limits  I-STAT ARTERIAL BLOOD GAS, ED - Abnormal; Notable  for the following components:   pH, Arterial 7.271 (*)    pCO2 arterial 52.6 (*)    pO2, Arterial 526 (*)    Acid-base deficit 4.0 (*)    All other components within normal limits  I-STAT CHEM 8, ED - Abnormal; Notable for the following components:   BUN 26 (*)    Glucose, Bld 268 (*)    All other components within normal limits  I-STAT VENOUS BLOOD GAS, ED - Abnormal; Notable for the following components:   pH, Ven 7.155 (*)    pCO2, Ven 76.6 (*)    pO2, Ven 166 (*)    Acid-base deficit 4.0 (*)    All other components within normal limits  LACTIC ACID, PLASMA  PROTIME-INR  ETHANOL  RAPID  URINE DRUG SCREEN, HOSP PERFORMED  TROPONIN I (HIGH SENSITIVITY)  TROPONIN I (HIGH SENSITIVITY)    DG Abdomen 1 View  Final Result    DG Chest Port 1 View  Final Result    CT HEAD WO CONTRAST ( )    (Results Pending)  CT Angio Chest Pulmonary Embolism (PE) W or WO Contrast    (Results Pending)    Medications  propofol (DIPRIVAN) 1000 MG/100ML infusion (24 mcg/kg/min  77.1 kg Intravenous Rate/Dose Change 09/08/22 0143)  fentaNYL in NS (20mcg/ml) infusion-PREMIX (has no administration in time range)  sodium chloride 0.9 % bolus 1,000 mL (1,000 mLs Intravenous New Bag/Given 09/08/22 0051)  albuterol (PROVENTIL) (2.5 MG/3ML) 0.083% nebulizer solution 10 mg (10 mg Nebulization Given 09/08/22 0113)  LORazepam (ATIVAN) injection 2 mg (2 mg Intravenous Given 09/08/22 0157)  iohexol (OMNIPAQUE) 350 MG/ML injection 75 mL (75 mLs Intravenous Contrast Given 09/08/22 0228)     Procedures  /  Critical Care .Critical Care  Performed by: Sabas Sous, MD Authorized by: Sabas Sous, MD   Critical care provider statement:    Critical care time (minutes):  80   Critical care was necessary to treat or prevent imminent or life-threatening deterioration of the following conditions:  Respiratory failure   Critical care was time spent personally by me on the following activities:  Development of treatment plan with patient or surrogate, discussions with consultants, evaluation of patient's response to treatment, examination of patient, ordering and review of laboratory studies, ordering and review of radiographic studies, ordering and performing treatments and interventions, pulse oximetry, re-evaluation of patient's condition and review of old charts Procedure Name: Intubation Date/Time: 09/08/2022 2:31 AM  Performed by: Sabas Sous, MDPre-anesthesia Checklist: Patient identified, Patient being monitored, Emergency Drugs available, Timeout performed and Suction available Oxygen  Delivery Method: Non-rebreather mask Preoxygenation: Pre-oxygenation with 100% oxygen Induction Type: Rapid sequence Ventilation: Mask ventilation without difficulty Laryngoscope Size: Glidescope and 4 Grade View: Grade I Tube size: 7.5 mm Number of attempts: 1 Airway Equipment and Method: Rigid stylet Placement Confirmation: ETT inserted through vocal cords under direct vision, CO2 detector and Breath sounds checked- equal and bilateral Secured at: 26 cm Tube secured with: ETT holder Comments: RSI using 100 mg rocuronium, 20 mg etomidate.      ED Course and Medical Decision Making  Initial Impression and Ddx History is suggestive of severe asthma exacerbation with hypoxia and/or hypercarbia causing altered mental status with agitation.  I spoke with EMS prior to arrival and gave authorization for Versed and route.  On arrival patient is being assisted with bag-valve-mask.  Upon transfer to the ED hospital bed patient wakes up and becomes combative once again.  Fighting her efforts,  not allowing supplemental oxygen.  Very poor air movement on exam, still in severe respiratory distress.  This necessitated RSI intubation as described above.  Patient is tachycardic and diaphoretic, possibly related to the epi given in the field.  Other causes of patient's hypoxic respiratory failure include PE, pneumothorax, pulmonary edema, all felt to be less likely at this time.  Past medical/surgical history that increases complexity of ED encounter: Reported history of asthma  Interpretation of Diagnostics I personally reviewed the EKG and my interpretation is as follows: Sinus tachycardia  Labs reveal respiratory acidosis otherwise no significant blood count or electrolyte disturbance  Patient Reassessment and Ultimate Disposition/Management     Patient with some continued sedation challenges despite propofol, adding on fentanyl drip, has required pushes of Ativan.  Some persistent hypertension,  tachycardia.  Per chart review, no history of asthma, also documented history of cocaine abuse and so avoiding beta-blockers at this time.  Blood pressure does seem to be improving with sedation, time.  Will admit to intensivist.  Patient management required discussion with the following services or consulting groups:  Intensivist Service  Complexity of Problems Addressed Acute illness or injury that poses threat of life of bodily function  Additional Data Reviewed and Analyzed Further history obtained from: Further history from spouse/family member  Additional Factors Impacting ED Encounter Risk Use of parenteral controlled substances, Consideration of hospitalization, and Major procedures  Elmer Sow. Pilar Plate, MD [facility] Emergency Medicine [facility] [email or web address]  Final Clinical Impressions(s) / ED Diagnoses     ICD-10-CM   1. Acute respiratory failure with hypoxia (HCC)  J96.01       ED Discharge Orders     None        Discharge Instructions Discussed with and Provided to Patient:   Discharge Instructions   None      Sabas Sous, MD 09/08/22 0236

## 2022-09-08 NOTE — ED Notes (Signed)
Vera Mother can be reached at [phone] (cell) with any additional updates

## 2022-09-08 NOTE — Progress Notes (Addendum)
Bladder scan showed . Attempted in and out w/o success. MD made aware, no new orders. Per MD will wait for pt to void.

## 2022-09-08 NOTE — Progress Notes (Signed)
Initial Nutrition Assessment  DOCUMENTATION CODES:   Non-severe (moderate) malnutrition in context of social or environmental circumstances  INTERVENTION:   Initiate tube feeds via OG tube: - Start Osmolite 1.5 @ 25 ml/hr and advance rate by 10 ml q 6 hours to goal rate of 55 ml/hr (1320 ml/day) - PROSource TF20 60 ml daily  Tube feeding regimen at goal rate provides 2060 kcal, 103 grams of protein, and 1006 ml of H2O.  Monitor magnesium, potassium, and phosphorus q 12 hours for at least 6 occurrences, MD to replete as needed, as pt is at risk for refeeding syndrome given malnutrition.  - MVI with minerals daily per tube  - Thiamine 100 mg daily per tube x 5 days for refeeding risk  NUTRITION DIAGNOSIS:   Moderate Malnutrition related to social / environmental circumstances (polysubstance abuse) as evidenced by mild fat depletion, mild muscle depletion, percent weight loss (10.1% weight loss in < 1 month).  GOAL:   Patient will meet greater than or equal to 90% of their needs  MONITOR:   Vent status, Labs, Weight trends, TF tolerance  REASON FOR ASSESSMENT:   Ventilator, Consult Enteral/tube feeding initiation and management  ASSESSMENT:   62 year old male who presented to the ED on 5/17 with SOB, AMS. Pt required intubation in the ED. PMH of asthma. Pt admitted with respiratory failure, acute toxic and metabolic encephalopathy.  Discussed pt with RN and during ICU rounds. Urine tox screen positive for benzodiazepines, cocaine, and THC. Consult received for enteral nutrition initiation and management. Pt with OG tube in fundus of stomach.  Unable to obtain diet and weight history at this time. No family present at time of RD visit. Reviewed chart that is marked for merge: Jason Cardenas, Jason Cardenas - [id number]. Per chart review, able to find height of 1.778 m. Pt was documented as weighing 184 lbs on 08/27/22. Current weight is 75 kg (165.35 lbs). If accurate, this is an 8.5 kg weight  loss (10.1%) in less than 1 month which is severe and significant for timeframe. Pt meets criteria for moderate malnutrition based on weight loss and NFPE.  Given malnutrition and hx of polysubstance abuse, pt is at refeeding risk. Will start tube feeds at trickle rate and slowly advance to goal. Will order thiamine for refeeding risk.  Patient is currently intubated on ventilator support MV: 10.7 L/min Temp (24hrs), Avg:97.9 F (36.6 C), Min:97.8 F (36.6 C), Max:98.1 F (36.7 C)  Drips: Propofol: off this AM Precedex Fentanyl: off this AM  Medications reviewed and include: colace, pepcid, IV solu-medrol, miralax  Labs reviewed: WBC 14.9 CBG's: 94-144  NUTRITION - FOCUSED PHYSICAL EXAM:  Flowsheet Row Most Recent Value  Orbital Region Mild depletion  Upper Arm Region Mild depletion  Thoracic and Lumbar Region Mild depletion  Buccal Region Unable to assess  Temple Region No depletion  Clavicle Bone Region Mild depletion  Clavicle and Acromion Bone Region Mild depletion  Scapular Bone Region Mild depletion  Dorsal Hand No depletion  Patellar Region Mild depletion  Anterior Thigh Region Mild depletion  Posterior Calf Region Mild depletion  Edema (RD Assessment) None  Hair Reviewed  Eyes Reviewed  Mouth Reviewed  Skin Reviewed  Nails Reviewed       Diet Order:   Diet Order             Diet NPO time specified  Diet effective now  EDUCATION NEEDS:   Not appropriate for education at this time  Skin:  Skin Assessment: Reviewed RN Assessment  Last BM:  no documented BM  Height:   Ht Readings from Last 1 Encounters:  09/08/22 5\' 10"  (1.778 m)    Weight:   Wt Readings from Last 1 Encounters:  09/08/22 75 kg    BMI:  Body mass index is 23.72 kg/m.  Estimated Nutritional Needs:   Kcal:  1900-2100  Protein:  95-110 grams  Fluid:  1.9-2.1 L    Mertie Clause, MS, RD, LDN Inpatient Clinical Dietitian Please see AMiON for  contact information.

## 2022-09-08 NOTE — ED Notes (Signed)
Propofol 10 mcg/kg /min

## 2022-09-08 NOTE — ED Notes (Signed)
7.5 ETT 26 cm at teeth

## 2022-09-08 NOTE — Progress Notes (Signed)
Transported pt from ED TRA C to CT and back with bedside RN.

## 2022-09-08 NOTE — Progress Notes (Signed)
NAMEHoa Cardenas, MRN:  [id number], DOB:  11-29-1960, LOS: 0 ADMISSION DATE:  09/08/2022, CONSULTATION DATE:  09/08/22 REFERRING MD:  Pilar Plate, CHIEF COMPLAINT:  agitation   History of Present Illness:  62yM with history of asthma/AR and multiple ED visits for bronchospasm whose friends called EMS earlier tonight for severe shortness of breath. EMS noted facial/lip cyanosis, wheezing, severe distress and agitation limiting use of supplemental O2. He was given versed, epi pen, solumedrol in the field. Here similarly with acute hypoxic hypercapnic respiratory failure and agitation requiring intubation.    In ED he was also given 1L NS, started on fentanyl, propofol.   Pertinent  Medical History  Asthma AR  Significant Hospital Events: Including procedures, antibiotic start and stop dates in addition to other pertinent events   09/08/22 intubated, admitted to ICU   Interim History / Subjective:    Objective   Blood pressure 127/86, pulse (!) 109, temperature 98.1 F (36.7 C), temperature source Rectal, resp. rate (!) 21, weight 77.1 kg, SpO2 100 %.    Vent Mode: PRVC FiO2 (%):  [60 %-100 %] 60 % Set Rate:  [18 bmp-22 bmp] 22 bmp Vt Set:  [620 mL] 620 mL PEEP:  [5 cmH20] 5 cmH20 Plateau Pressure:  [18 cmH20] 18 cmH20  No intake or output data in the 24 hours ending 09/08/22 0241 Filed Weights   09/08/22 0042  Weight: 77.1 kg    Examination: General appearance: 62 y.o., male, intubated, intermittently agitated on fentanyl 50 mcg/hr, propofol 30 mcg/kg/min Eyes: injected sclerae; PERRL, not tracking HENT: NCAT; MMM Neck: Trachea midline; no lymphadenopathy, no JVD Lungs: mech breath sounds bl after continuous neb, equal chest rise. On Vt 620/RR 18/PEEP 5 his peaks were 24, plat 12, intrinsic peep of 6.  CV: tachy RR, no murmur  Abdomen: Soft, non-tender; non-distended, BS present  Extremities: No peripheral edema, warm Skin: Normal turgor and texture; no rash Neuro: rass +1,  not following commands   Resolved Hospital Problem list    Assessment & Plan:   # Acute hypoxic hypercapnic respiratory failure Possibly developing bronchospasm in setting THC, cocaine use. Limited extent of ggo/scar RUL. - f/u rvp, trach aspirate  - lung protective ventilation, permissive hypercapnia - sedate for rass -2 to -3  - solumedrol 60 mg daily - duoneb q4, albuterol prn  # Acute toxic and metabolic encephalopathy - supportive care as above  # Leukocytosis Stress/steroid exposure - trend cbc  Best Practice (right click and "Reselect all SmartList Selections" daily)   Diet/type: NPO w/ meds via tube DVT prophylaxis: LMWH GI prophylaxis: H2B Lines: N/A Foley:  N/A Code Status:  full code Last date of multidisciplinary goals of care discussion [will update adult children in morning. Spoke with pt's mom who was at bedside this evening. ]  Labs   CBC: Recent Labs  Lab 09/08/22 0042 09/08/22 0101 09/08/22 0102 09/08/22 0201  WBC 22.3*  --   --   --   HGB 14.8 16.0 16.0 16.0  HCT 45.9 47.0 47.0 47.0  MCV 86.6  --   --   --   PLT 402*  --   --   --     Basic Metabolic Panel: Recent Labs  Lab 09/08/22 0042 09/08/22 0101 09/08/22 0102 09/08/22 0201  NA 137 141 139 137  K 4.7 4.8 4.8 4.5  CL 102 104  --   --   CO2 24  --   --   --   GLUCOSE 262*  268*  --   --   BUN 18 26*  --   --   CREATININE 1.32* 1.20  --   --   CALCIUM 8.8*  --   --   --    GFR: CrCl cannot be calculated (Unknown ideal weight.). Recent Labs  Lab 09/08/22 0042  WBC 22.3*  LATICACIDVEN 1.3    Liver Function Tests: Recent Labs  Lab 09/08/22 0042  AST 24  ALT 21  ALKPHOS 58  BILITOT 0.9  PROT 6.9  ALBUMIN 4.0   No results for input(s): "LIPASE", "AMYLASE" in the last 168 hours. No results for input(s): "AMMONIA" in the last 168 hours.  ABG    Component Value Date/Time   PHART 7.271 (L) 09/08/2022 0201   PCO2ART 52.6 (H) 09/08/2022 0201   PO2ART 526 (H)  09/08/2022 0201   HCO3 24.3 09/08/2022 0201   TCO2 26 09/08/2022 0201   ACIDBASEDEF 4.0 (H) 09/08/2022 0201   O2SAT 100 09/08/2022 0201     Coagulation Profile: Recent Labs  Lab 09/08/22 0042  INR 1.1    Cardiac Enzymes: No results for input(s): "CKTOTAL", "CKMB", "CKMBINDEX", "TROPONINI" in the last 168 hours.  HbA1C: No results found for: "HGBA1C"  CBG: No results for input(s): "GLUCAP" in the last 168 hours.  Review of Systems:   Unable to obtain, intubated  Past Medical History:  He,  has no past medical history on file.   Surgical History:    Social History:      Family History:  His family history is not on file.   Allergies Not on File   Home Medications  Prior to Admission medications   Not on File     Critical care time: 35 minutes

## 2022-09-08 NOTE — ED Notes (Signed)
Pt having generalized body jerking bilateral arms and legs. No rapid eye movement. EDP Bero at bedside for pt evaluations. Pts mother and aunt also at bedside

## 2022-09-08 NOTE — Progress Notes (Signed)
Patient received from ED on ventilator to bed 44M bed 10. On PRVC 620 R18 +5 40%

## 2022-09-08 NOTE — Progress Notes (Signed)
PO meds on hold, Pt extubated and still lethargic to perform yale swallow

## 2022-09-08 NOTE — ED Notes (Signed)
Mother at bedside.

## 2022-09-09 LAB — CBC
HCT: 41.3 % (ref 39.0–52.0)
Hemoglobin: 13.7 g/dL (ref 13.0–17.0)
MCH: 27.8 pg (ref 26.0–34.0)
MCHC: 33.2 g/dL (ref 30.0–36.0)
MCV: 83.8 fL (ref 80.0–100.0)
Platelets: 300 10*3/uL (ref 150–400)
RBC: 4.93 MIL/uL (ref 4.22–5.81)
RDW: 14.8 % (ref 11.5–15.5)
WBC: 12.8 10*3/uL — ABNORMAL HIGH (ref 4.0–10.5)
nRBC: 0 % (ref 0.0–0.2)

## 2022-09-09 LAB — TRIGLYCERIDES: Triglycerides: 84 mg/dL (ref <150)

## 2022-09-09 LAB — PHOSPHORUS
Phosphorus: 2.7 mg/dL (ref 2.5–4.6)
Phosphorus: 1.2 mg/dL — ABNORMAL LOW (ref 2.5–4.6)

## 2022-09-09 LAB — GLUCOSE, CAPILLARY
Glucose-Capillary: 107 mg/dL — ABNORMAL HIGH (ref 70–99)
Glucose-Capillary: 93 mg/dL (ref 70–99)
Glucose-Capillary: 103 mg/dL — ABNORMAL HIGH (ref 70–99)
Glucose-Capillary: 135 mg/dL — ABNORMAL HIGH (ref 70–99)

## 2022-09-09 LAB — MAGNESIUM
Magnesium: 2.1 mg/dL (ref 1.7–2.4)
Magnesium: 2 mg/dL (ref 1.7–2.4)

## 2022-09-09 LAB — BASIC METABOLIC PANEL
Anion gap: 7 (ref 5–15)
BUN: 14 mg/dL (ref 6–20)
CO2: 25 mmol/L (ref 22–32)
Calcium: 8.9 mg/dL (ref 8.9–10.3)
Chloride: 105 mmol/L (ref 98–111)
Creatinine, Ser: 0.91 mg/dL (ref 0.61–1.24)
GFR, Estimated: >60 mL/min (ref >60)
Glucose, Bld: 103 mg/dL — ABNORMAL HIGH (ref 70–99)
Potassium: 3.7 mmol/L (ref 3.5–5.1)
Sodium: 137 mmol/L (ref 135–145)

## 2022-09-09 MED ORDER — PREDNISONE 20 MG PO TABS
40.0000 mg | ORAL_TABLET | Freq: Every day | ORAL | Status: AC
  Administered 2022-09-09 – 2022-09-10 (×2): 40 mg via ORAL
  Filled 2022-09-09 (×2): qty 2

## 2022-09-09 MED ORDER — PREDNISONE 20 MG PO TABS: 20.0000 mg | ORAL_TABLET | Freq: Every day | ORAL | Status: DC

## 2022-09-09 MED ORDER — PREDNISONE 20 MG PO TABS: 30.0000 mg | ORAL_TABLET | Freq: Every day | ORAL | Status: DC

## 2022-09-09 MED ORDER — AMLODIPINE BESYLATE 5 MG PO TABS
5.0000 mg | ORAL_TABLET | Freq: Every day | ORAL | Status: DC
  Administered 2022-09-09 – 2022-09-10 (×2): 5 mg via ORAL
  Filled 2022-09-09 (×2): qty 1

## 2022-09-09 MED ORDER — PREDNISONE 10 MG PO TABS: 10.0000 mg | ORAL_TABLET | Freq: Every day | ORAL | Status: DC

## 2022-09-09 MED ORDER — PREDNISONE 20 MG PO TABS: 40.0000 mg | ORAL_TABLET | Freq: Every day | ORAL | Status: DC

## 2022-09-09 NOTE — Progress Notes (Signed)
[name], MRN:  [id number], DOB:  1960/07/16, LOS: 1 ADMISSION DATE:  09/08/2022, CONSULTATION DATE:  09/08/22 REFERRING MD:  Pilar Plate, CHIEF COMPLAINT:  agitation   History of Present Illness:  61yM with history of asthma/AR and multiple ED visits for bronchospasm whose friends called EMS earlier tonight for severe shortness of breath. EMS noted facial/lip cyanosis, wheezing, severe distress and agitation limiting use of supplemental O2. He was given versed, epi pen, solumedrol in the field. Here similarly with acute hypoxic hypercapnic respiratory failure and agitation requiring intubation.    In ED he was also given 1L NS, started on fentanyl, propofol.   Pertinent  Medical History  Asthma AR  Significant Hospital Events: Including procedures, antibiotic start and stop dates in addition to other pertinent events   09/08/22 intubated, admitted to ICU  09/08/22 extubated   Interim History / Subjective:   Extubated 5/17 Room air Remains on solumedrol Scheduled duoneb   Objective   Blood pressure (!) 151/98, pulse 99, temperature 98.8 F (37.1 C), temperature source Oral, resp. rate 20, height 5\' 10"  (1.778 m), weight 76 kg, SpO2 96 %.    Vent Mode: PRVC FiO2 (%):  [40 %] 40 % Set Rate:  [18 bmp] 18 bmp Vt Set:  [620 mL] 620 mL PEEP:  [5 cmH20] 5 cmH20 Plateau Pressure:  [13 cmH20] 13 cmH20   Intake/Output Summary (Last 24 hours) at 09/09/2022 1028 Last data filed at 09/09/2022 0800 Gross per 24 hour  Intake 714.37 ml  Output 250 ml  Net 464.37 ml   Filed Weights   09/08/22 0042 09/08/22 0442 09/09/22 0320  Weight: 77.1 kg 75 kg 76 kg    Examination: General: Well-appearing gentleman laying in bed in no distress Neuro: Awake, alert, interacting appropriately, follows commands, well-oriented HEENT: Oropharynx clear, no stridor, no secretions Cardiovascular: Regular, distant, no murmur Lungs: Clear bilaterally, no wheezing currently Abdomen: Nondistended,  positive bowel sounds Musculoskeletal: No deformities, no edema Skin: No rash  CTA 5/17: no PE. No acute disease. Scarring anteriorly in the RUL.  CT head 5/17: negative   Resolved Hospital Problem list    Assessment & Plan:   # Acute hypoxic hypercapnic respiratory failure #acute asthma, bronchospasm, suspect precipitated by cocaine / THC -Transition Solu-Medrol to prednisone 40 mg daily and plan to taper -Scheduled DuoNeb -Not on ICS or scheduled BD therapy as an outpatient -Substance abuse counseling once stabilized  # Acute toxic and metabolic encephalopathy -Improved -Minimize sedating medications  # Leukocytosis, suspect due to steroids, acute stress reaction -Trend CBC  Will transfer to floor bed on 3 5/18, to TRH as of 5/19   Best Practice (right click and "Reselect all SmartList Selections" daily)   Diet/type: Regular consistency (see orders) DVT prophylaxis: LMWH GI prophylaxis: H2B Lines: N/A Foley:  N/A Code Status:  full code Last date of multidisciplinary goals of care discussion [Mother updated]  Labs   CBC: Recent Labs  Lab 09/08/22 0042 09/08/22 0101 09/08/22 0102 09/08/22 0201 09/08/22 0734 09/09/22 0653  WBC 22.3*  --   --   --  14.9* 12.8*  HGB 14.8 16.0 16.0 16.0 14.9 13.7  HCT 45.9 47.0 47.0 47.0 45.9 41.3  MCV 86.6  --   --   --  84.7 83.8  PLT 402*  --   --   --  281 300    Basic Metabolic Panel: Recent Labs  Lab 09/08/22 0042 09/08/22 0101 09/08/22 0102 09/08/22 0201 09/08/22 0734 09/08/22 1516 09/08/22 1826  09/09/22 0653  NA 137 141 139 137 140  --   --  137  K 4.7 4.8 4.8 4.5 4.2  --   --  3.7  CL 102 104  --   --  105  --   --  105  CO2 24  --   --   --  22  --   --  25  GLUCOSE 262* 268*  --   --  109*  --   --  103*  BUN 18 26*  --   --  15  --   --  14  CREATININE 1.32* 1.20  --   --  1.12  --   --  0.91  CALCIUM 8.8*  --   --   --  8.9  --   --  8.9  MG  --   --   --   --   --  2.5* 2.5* 2.1  PHOS  --   --    --   --   --  3.1 3.6 2.7   GFR: Estimated Creatinine Clearance: 90.2 mL/min (by C-G formula based on SCr of 0.91 mg/dL). Recent Labs  Lab 09/08/22 0042 09/08/22 0734 09/09/22 0653  WBC 22.3* 14.9* 12.8*  LATICACIDVEN 1.3  --   --     Liver Function Tests: Recent Labs  Lab 09/08/22 0042  AST 24  ALT 21  ALKPHOS 58  BILITOT 0.9  PROT 6.9  ALBUMIN 4.0   No results for input(s): "LIPASE", "AMYLASE" in the last 168 hours. No results for input(s): "AMMONIA" in the last 168 hours.  ABG    Component Value Date/Time   PHART 7.271 (L) 09/08/2022 0201   PCO2ART 52.6 (H) 09/08/2022 0201   PO2ART 526 (H) 09/08/2022 0201   HCO3 24.3 09/08/2022 0201   TCO2 26 09/08/2022 0201   ACIDBASEDEF 4.0 (H) 09/08/2022 0201   O2SAT 100 09/08/2022 0201     Coagulation Profile: Recent Labs  Lab 09/08/22 0042  INR 1.1    Cardiac Enzymes: Recent Labs  Lab 09/08/22 0734  CKTOTAL 324    HbA1C: No results found for: "HGBA1C"  CBG: Recent Labs  Lab 09/08/22 1517 09/08/22 1941 09/08/22 2318 09/09/22 0317 09/09/22 0727  GLUCAP 134* 109* 105* 107* 93     Critical care time: N/A     Levy Pupa, MD, PhD 09/09/2022, 10:31 AM [facility] [phone] or if no answer before 7:00PM call [phone] For any issues after 7:00PM please call eLink [phone]

## 2022-09-09 NOTE — Progress Notes (Signed)
Report called and pt stable for transport.

## 2022-09-09 NOTE — Progress Notes (Signed)
Report to 10M attempted by this RN

## 2022-09-09 NOTE — Progress Notes (Signed)
MD made aware of trending BP. New orders in

## 2022-09-10 LAB — BASIC METABOLIC PANEL
Anion gap: 12 (ref 5–15)
BUN: 14 mg/dL (ref 6–20)
CO2: 21 mmol/L — ABNORMAL LOW (ref 22–32)
Calcium: 8.5 mg/dL — ABNORMAL LOW (ref 8.9–10.3)
Chloride: 102 mmol/L (ref 98–111)
Creatinine, Ser: 0.98 mg/dL (ref 0.61–1.24)
GFR, Estimated: >60 mL/min (ref >60)
Glucose, Bld: 113 mg/dL — ABNORMAL HIGH (ref 70–99)
Potassium: 3.8 mmol/L (ref 3.5–5.1)
Sodium: 135 mmol/L (ref 135–145)

## 2022-09-10 MED ORDER — THIAMINE MONONITRATE 100 MG PO TABS: 100.0000 mg | ORAL_TABLET | Freq: Every day | ORAL | Status: DC

## 2022-09-10 MED ORDER — DOCUSATE SODIUM 50 MG/5ML PO LIQD
100.0000 mg | Freq: Two times a day (BID) | ORAL | Status: DC
  Filled 2022-09-10: qty 10

## 2022-09-10 MED ORDER — POLYETHYLENE GLYCOL 3350 17 G PO PACK: 17.0000 g | PACK | Freq: Every day | ORAL | Status: DC

## 2022-09-10 MED ORDER — ADULT MULTIVITAMIN W/MINERALS CH: 1.0000 | ORAL_TABLET | Freq: Every day | ORAL | Status: DC

## 2022-09-10 MED ORDER — POLYETHYLENE GLYCOL 3350 17 G PO PACK: 17.0000 g | PACK | Freq: Every day | ORAL | Status: DC | PRN

## 2022-09-10 MED ORDER — DOCUSATE SODIUM 50 MG/5ML PO LIQD: 100.0000 mg | Freq: Two times a day (BID) | ORAL | Status: DC | PRN

## 2022-09-10 MED ORDER — PREDNISONE 10 MG PO TABS: ORAL_TABLET | ORAL | 0 refills | Status: DC

## 2022-09-10 NOTE — Progress Notes (Signed)
DISCHARGE NOTE HOME Kyson Koepp to be discharged Home per MD order. Discussed prescriptions and follow up appointments with the patient. Prescriptions given to patient; medication list explained in detail. Patient verbalized understanding.  Skin clean, dry and intact without evidence of skin break down, no evidence of skin tears noted. IV catheter discontinued intact. Site without signs and symptoms of complications. Dressing and pressure applied. Pt denies pain at the site currently. No complaints noted.  Patient free of lines, drains, and wounds.   An After Visit Summary (AVS) was printed and given to the patient. Patient escorted via wheelchair to main lobby. Pt. Being picked up by Joaquim Nam, mother.  Margarita Grizzle, RN

## 2022-09-10 NOTE — Discharge Summary (Signed)
Physician Discharge Summary  Jason Cardenas YNW:[id number] DOB: 27-Apr-1960 DOA: 09/08/2022  PCP: Pcp, No  Admit date: 09/08/2022 Discharge date: 09/10/2022  Admitted From: Home Discharge disposition: Home  Recommendations at discharge:  Stop drug abuse Complete tapering course of prednisone  Brief narrative: Jason Cardenas is a 62 y.o. male with PMH significant for polysubstance abuse (cocaine, THC), asthma and multiple ED visits for bronchospasm. 5/16, patient was with his friend and using cocaine.  He started getting short of breath.  Friend called EMS. EMS noted facial/lip cyanosis, wheezing, severe distress and agitation limiting use of supplemental O2. He was given versed, epi pen, solumedrol in the field.   In the ED he was agitated as well as required sedatives including IV fentanyl and propofol.Marland Kitchen  He required intubation for airway protection. Mental status gradually improved.  He was extubated next day 5/17 Transferred out to [facility]  Subjective: Patient was seen and examined this morning.  Pleasant middle-aged African-American male.  Propped up in bed.  Not in distress no new symptoms.  Alert, awake, oriented x 3.  Able to ambulate inside the room.  Feels ready to be discharged today.  Assessment and plan: Acute toxic metabolic encephalopathy Secondary to cocaine abuse. Initially he was very agitated, restless and required IV propofol, IV fentanyl. Mental status gradually improved to normal   Acute hypoxic hypercapnic respiratory failure History of asthma Patient probably had bronchospasm secondary to cocaine/THC use. With agitation and need of sedatives, he needed intubation for about 24 hours.  Successfully extubated and currently on room air.   On exam today, he has mild wheezing..  Currently on prednisone 40 mg daily.  Discharged on tapering course of same. Continue as needed bronchodilators.  Polysubstance abuse (cocaine, THC) Counseled to quit  Wounds:  -     Discharge Exam:   Vitals:   09/09/22 1952 09/10/22 0616 09/10/22 0713 09/10/22 0722  BP: 133/76 (!) 143/75  (!) 141/96  Pulse: 82 61  77  Resp: 20 16  18   Temp: 98.9 F (37.2 C) 97.9 F (36.6 C)  98.2 F (36.8 C)  TempSrc: Oral Oral  Oral  SpO2: 93% 97%  98%  Weight:   74.7 kg   Height:        Body mass index is 23.63 kg/m.  General exam: Pleasant, not in distress Skin: No rashes, lesions or ulcers. HEENT: Atraumatic, normocephalic, no obvious bleeding Lungs: Mild bilateral expiratory wheezing CVS: Regular rate and rhythm, no murmur GI/Abd soft, nontender, nondistended, bowel sound present CNS: Alert, awake, oriented x 3 Psychiatry: Mood appropriate Extremities: No pedal edema, no calf tenderness  Follow ups:    Follow-up Information     [facility] Follow up.   Contact information: [address] [place] [address] [phone]                Discharge Instructions:   Discharge Instructions     Call MD for:  difficulty breathing, headache or visual disturbances   Complete by: As directed    Call MD for:  extreme fatigue   Complete by: As directed    Call MD for:  hives   Complete by: As directed    Call MD for:  persistant dizziness or light-headedness   Complete by: As directed    Call MD for:  persistant nausea and vomiting   Complete by: As directed    Call MD for:  severe uncontrolled pain   Complete  by: As directed    Call MD for:  temperature >100.4   Complete by: As directed    Diet general   Complete by: As directed    Discharge instructions   Complete by: As directed    Recommendations at discharge:   Stop drug abuse  Complete tapering course of prednisone  General discharge instructions: Follow with Primary MD Pcp, No in 7 days  Please request your PCP  to go over your hospital tests, procedures, radiology results at the follow up. Please get your medicines  reviewed and adjusted.  Your PCP may decide to repeat certain labs or tests as needed. Do not drive, operate heavy machinery, perform activities at heights, swimming or participation in water activities or provide baby sitting services if your were admitted for syncope or siezures until you have seen by Primary MD or a Neurologist and advised to do so again. [place] Controlled Substance Reporting System database was reviewed. Do not drive, operate heavy machinery, perform activities at heights, swim, participate in water activities or provide baby-sitting services while on medications for pain, sleep and mood until your outpatient physician has reevaluated you and advised to do so again.  You are strongly recommended to comply with the dose, frequency and duration of prescribed medications. Activity: As tolerated with Full fall precautions use walker/cane & assistance as needed Avoid using any recreational substances like cigarette, tobacco, alcohol, or non-prescribed drug. If you experience worsening of your admission symptoms, develop shortness of breath, life threatening emergency, suicidal or homicidal thoughts you must seek medical attention immediately by calling 911 or calling your MD immediately  if symptoms less severe. You must read complete instructions/literature along with all the possible adverse reactions/side effects for all the medicines you take and that have been prescribed to you. Take any new medicine only after you have completely understood and accepted all the possible adverse reactions/side effects.  Wear Seat belts while driving. You were cared for by a hospitalist during your hospital stay. If you have any questions about your discharge medications or the care you received while you were in the hospital after you are discharged, you can call the unit and ask to speak with the hospitalist or the covering physician. Once you are discharged, your primary care physician will  handle any further medical issues. Please note that NO REFILLS for any discharge medications will be authorized once you are discharged, as it is imperative that you return to your primary care physician (or establish a relationship with a primary care physician if you do not have one).   Increase activity slowly   Complete by: As directed        Discharge Medications:   Allergies as of 09/10/2022   No Known Allergies      Medication List     TAKE these medications    acetaminophen 325 MG tablet Commonly known as: TYLENOL Take 650 mg by mouth every 6 (six) hours as needed for mild pain.   albuterol 108 (90 Base) MCG/ACT inhaler Commonly known as: VENTOLIN HFA Inhale 1 puff into the lungs every 6 (six) hours as needed for wheezing or shortness of breath.   guaifenesin 100 MG/5ML syrup Commonly known as: ROBITUSSIN Take 200 mg by mouth 3 (three) times daily as needed for cough.   predniSONE 10 MG tablet Commonly known as: DELTASONE Take 4 tablets daily X 2 days, then, Take 3 tablets daily X 2 days, then, Take 2 tablets daily X 2 days, then,  Take 1 tablets daily X 1 day.         The results of significant diagnostics from this hospitalization (including imaging, microbiology, ancillary and laboratory) are listed below for reference.    Procedures and Diagnostic Studies:   CT Angio Chest Pulmonary Embolism (PE) W or WO Contrast  Result Date: 09/08/2022 CLINICAL DATA:  Pulmonary embolism (PE) suspected, high prob, respiratory distress EXAM: CT ANGIOGRAPHY CHEST WITH CONTRAST TECHNIQUE: Multidetector CT imaging of the chest was performed using the standard protocol during bolus administration of intravenous contrast. Multiplanar CT image reconstructions and MIPs were obtained to evaluate the vascular anatomy. RADIATION DOSE REDUCTION: This exam was performed according to the departmental dose-optimization program which includes automated exposure control, adjustment of the mA  and/or kV according to patient size and/or use of iterative reconstruction technique. CONTRAST:  75mL OMNIPAQUE IOHEXOL 350 MG/ML SOLN COMPARISON:  None FINDINGS: Cardiovascular: No filling defects in the pulmonary arteries to suggest pulmonary emboli. Heart is normal size. Aorta is normal caliber. Mediastinum/Nodes: No mediastinal, hilar, or axillary adenopathy. Endotracheal tube tip in the midtrachea. Trachea and esophagus are unremarkable. Thyroid unremarkable. Lungs/Pleura: Linear scarring anteriorly in the right upper lobe. Left lung clear. No effusions. Upper Abdomen: No acute findings Musculoskeletal: Chest wall soft tissues are unremarkable. No acute bony abnormality. Review of the MIP images confirms the above findings. IMPRESSION: No evidence of pulmonary embolus. No acute cardiopulmonary disease. Scarring anteriorly in the right upper lobe. Electronically Signed   By: Charlett Nose M.D.   On: 09/08/2022 03:02   CT HEAD WO CONTRAST ( )  Result Date: 09/08/2022 CLINICAL DATA:  Delirium, altered mental status EXAM: CT HEAD WITHOUT CONTRAST TECHNIQUE: Contiguous axial images were obtained from the base of the skull through the vertex without intravenous contrast. RADIATION DOSE REDUCTION: This exam was performed according to the departmental dose-optimization program which includes automated exposure control, adjustment of the mA and/or kV according to patient size and/or use of iterative reconstruction technique. COMPARISON:  None Available. FINDINGS: Brain: No acute intracranial abnormality. Specifically, no hemorrhage, hydrocephalus, mass lesion, acute infarction, or significant intracranial injury. Vascular: No hyperdense vessel or unexpected calcification. Skull: No acute calvarial abnormality. Sinuses/Orbits: No acute findings Other: None IMPRESSION: No acute intracranial abnormality. Electronically Signed   By: Charlett Nose M.D.   On: 09/08/2022 02:51   DG Abdomen 1 View  Result Date:  09/08/2022 CLINICAL DATA:  OG tube placement EXAM: ABDOMEN - 1 VIEW COMPARISON:  None Available. FINDINGS: OG tube tip is in the fundus of the stomach. Mild gaseous distention of the stomach. No evidence of bowel obstruction. IMPRESSION: OG tube tip in the stomach. Electronically Signed   By: Charlett Nose M.D.   On: 09/08/2022 01:12   DG Chest Port 1 View  Result Date: 09/08/2022 CLINICAL DATA:  Respiratory distress, ET tube placement EXAM: PORTABLE CHEST 1 VIEW COMPARISON:  None Available. FINDINGS: Endotracheal tube tip is 4.5 cm above the carina. NG tube enters the stomach. Heart and mediastinal contours are within normal limits. No focal opacities or effusions. No acute bony abnormality. IMPRESSION: No active cardiopulmonary disease. Electronically Signed   By: Charlett Nose M.D.   On: 09/08/2022 01:11     Labs:   Basic Metabolic Panel: Recent Labs  Lab 09/08/22 0042 09/08/22 0101 09/08/22 0102 09/08/22 0201 09/08/22 0734 09/08/22 1516 09/08/22 1826 09/09/22 0653 09/09/22 1723 09/10/22 0200  NA 137 141 139 137 140  --   --  137  --  135  K 4.7  4.8 4.8 4.5 4.2  --   --  3.7  --  3.8  CL 102 104  --   --  105  --   --  105  --  102  CO2 24  --   --   --  22  --   --  25  --  21*  GLUCOSE 262* 268*  --   --  109*  --   --  103*  --  113*  BUN 18 26*  --   --  15  --   --  14  --  14  CREATININE 1.32* 1.20  --   --  1.12  --   --  0.91  --  0.98  CALCIUM 8.8*  --   --   --  8.9  --   --  8.9  --  8.5*  MG  --   --   --   --   --  2.5* 2.5* 2.1 2.0  --   PHOS  --   --   --   --   --  3.1 3.6 2.7 1.2*  --    GFR Estimated Creatinine Clearance: 83.8 mL/min (by C-G formula based on SCr of 0.98 mg/dL). Liver Function Tests: Recent Labs  Lab 09/08/22 0042  AST 24  ALT 21  ALKPHOS 58  BILITOT 0.9  PROT 6.9  ALBUMIN 4.0   No results for input(s): "LIPASE", "AMYLASE" in the last 168 hours. No results for input(s): "AMMONIA" in the last 168 hours. Coagulation profile Recent  Labs  Lab 09/08/22 0042  INR 1.1    CBC: Recent Labs  Lab 09/08/22 0042 09/08/22 0101 09/08/22 0102 09/08/22 0201 09/08/22 0734 09/09/22 0653  WBC 22.3*  --   --   --  14.9* 12.8*  HGB 14.8 16.0 16.0 16.0 14.9 13.7  HCT 45.9 47.0 47.0 47.0 45.9 41.3  MCV 86.6  --   --   --  84.7 83.8  PLT 402*  --   --   --  281 300   Cardiac Enzymes: Recent Labs  Lab 09/08/22 0734  CKTOTAL 324   BNP: Invalid input(s): "POCBNP" CBG: Recent Labs  Lab 09/08/22 2318 09/09/22 0317 09/09/22 0727 09/09/22 1142 09/09/22 1509  GLUCAP 105* 107* 93 103* 135*   D-Dimer No results for input(s): "DDIMER" in the last 72 hours. Hgb A1c No results for input(s): "HGBA1C" in the last 72 hours. Lipid Profile Recent Labs    09/09/22 0653  TRIG 84   Thyroid function studies No results for input(s): "TSH", "T4TOTAL", "T3FREE", "THYROIDAB" in the last 72 hours.  Invalid input(s): "FREET3" Anemia work up No results for input(s): "VITAMINB12", "FOLATE", "FERRITIN", "TIBC", "IRON", "RETICCTPCT" in the last 72 hours. Microbiology Recent Results (from the past 240 hour(s))  Respiratory (~20 pathogens) panel by PCR     Status: None   Collection Time: 09/08/22  3:27 AM   Specimen: Nasopharyngeal Swab; Respiratory  Result Value Ref Range Status   Adenovirus NOT DETECTED NOT DETECTED Final   Coronavirus 229E NOT DETECTED NOT DETECTED Final    Comment: (NOTE) The Coronavirus on the Respiratory Panel, DOES NOT test for the novel  Coronavirus (2019 nCoV)    Coronavirus HKU1 NOT DETECTED NOT DETECTED Final   Coronavirus NL63 NOT DETECTED NOT DETECTED Final   Coronavirus OC43 NOT DETECTED NOT DETECTED Final   Metapneumovirus NOT DETECTED NOT DETECTED Final   Rhinovirus / Enterovirus NOT DETECTED NOT DETECTED Final  Influenza A NOT DETECTED NOT DETECTED Final   Influenza B NOT DETECTED NOT DETECTED Final   Parainfluenza Virus 1 NOT DETECTED NOT DETECTED Final   Parainfluenza Virus 2 NOT DETECTED  NOT DETECTED Final   Parainfluenza Virus 3 NOT DETECTED NOT DETECTED Final   Parainfluenza Virus 4 NOT DETECTED NOT DETECTED Final   Respiratory Syncytial Virus NOT DETECTED NOT DETECTED Final   Bordetella pertussis NOT DETECTED NOT DETECTED Final   Bordetella Parapertussis NOT DETECTED NOT DETECTED Final   Chlamydophila pneumoniae NOT DETECTED NOT DETECTED Final   Mycoplasma pneumoniae NOT DETECTED NOT DETECTED Final    Comment: Performed at [facility], [address], [place], [place] [address]  MRSA Next Gen by PCR, Nasal     Status: None   Collection Time: 09/08/22  4:04 AM   Specimen: Nasopharyngeal Swab; Nasal Swab  Result Value Ref Range Status   MRSA by PCR Next Gen NOT DETECTED NOT DETECTED Final    Comment: (NOTE) The GeneXpert MRSA Assay (FDA approved for NASAL specimens only), is one component of a comprehensive MRSA colonization surveillance program. It is not intended to diagnose MRSA infection nor to guide or monitor treatment for MRSA infections. Test performance is not FDA approved in patients less than 43 years old. Performed at [facility], [address] [place], [place] [address]     Time coordinating discharge: 35 minutes  Signed: Bennet Kujawa  [facility] 09/10/2022, 1:46 PM

## 2022-09-10 NOTE — Plan of Care (Signed)

## 2022-09-11 NOTE — H&P (Signed)
See my progress note from same day for H and P

## 2022-11-30 ENCOUNTER — Other Ambulatory Visit: Payer: Self-pay

## 2022-11-30 ENCOUNTER — Emergency Department (HOSPITAL_COMMUNITY): Payer: PRIVATE HEALTH INSURANCE

## 2022-11-30 ENCOUNTER — Other Ambulatory Visit (HOSPITAL_COMMUNITY): Payer: Self-pay

## 2022-11-30 ENCOUNTER — Encounter (HOSPITAL_COMMUNITY): Payer: Self-pay

## 2022-11-30 ENCOUNTER — Observation Stay (HOSPITAL_COMMUNITY)
Admission: EM | Admit: 2022-11-30 | Discharge: 2022-11-30 | Disposition: A | Discharge status: Home / Self Care | Payer: PRIVATE HEALTH INSURANCE | Attending: Internal Medicine | Admitting: Internal Medicine

## 2022-11-30 DIAGNOSIS — J9601 Acute respiratory failure with hypoxia: Secondary | ICD-10-CM

## 2022-11-30 DIAGNOSIS — R0602 Shortness of breath: Secondary | ICD-10-CM | POA: Diagnosis present

## 2022-11-30 DIAGNOSIS — J9600 Acute respiratory failure, unspecified whether with hypoxia or hypercapnia: Principal | ICD-10-CM

## 2022-11-30 DIAGNOSIS — J45901 Unspecified asthma with (acute) exacerbation: Principal | ICD-10-CM | POA: Diagnosis present

## 2022-11-30 DIAGNOSIS — J9602 Acute respiratory failure with hypercapnia: Secondary | ICD-10-CM | POA: Insufficient documentation

## 2022-11-30 DIAGNOSIS — Z79899 Other long term (current) drug therapy: Secondary | ICD-10-CM | POA: Insufficient documentation

## 2022-11-30 DIAGNOSIS — J4521 Mild intermittent asthma with (acute) exacerbation: Secondary | ICD-10-CM

## 2022-11-30 DIAGNOSIS — J4551 Severe persistent asthma with (acute) exacerbation: Secondary | ICD-10-CM | POA: Diagnosis present

## 2022-11-30 DIAGNOSIS — J441 Chronic obstructive pulmonary disease with (acute) exacerbation: Secondary | ICD-10-CM | POA: Diagnosis not present

## 2022-11-30 DIAGNOSIS — F1910 Other psychoactive substance abuse, uncomplicated: Secondary | ICD-10-CM | POA: Diagnosis present

## 2022-11-30 LAB — COMPREHENSIVE METABOLIC PANEL
ALT: 20 U/L (ref 0–44)
AST: 20 U/L (ref 15–41)
Albumin: 4 g/dL (ref 3.5–5.0)
Alkaline Phosphatase: 60 U/L (ref 38–126)
Anion gap: 12 (ref 5–15)
BUN: 10 mg/dL (ref 6–20)
CO2: 22 mmol/L (ref 22–32)
Calcium: 8.7 mg/dL — ABNORMAL LOW (ref 8.9–10.3)
Chloride: 104 mmol/L (ref 98–111)
Creatinine, Ser: 0.89 mg/dL (ref 0.61–1.24)
GFR, Estimated: >60 mL/min (ref >60)
Glucose, Bld: 156 mg/dL — ABNORMAL HIGH (ref 70–99)
Potassium: 3.5 mmol/L (ref 3.5–5.1)
Sodium: 138 mmol/L (ref 135–145)
Total Bilirubin: 0.7 mg/dL (ref 0.3–1.2)
Total Protein: 7.2 g/dL (ref 6.5–8.1)

## 2022-11-30 LAB — CBC WITH DIFFERENTIAL/PLATELET
Abs Immature Granulocytes: 0.05 10*3/uL (ref 0.00–0.07)
Basophils Absolute: 0 10*3/uL (ref 0.0–0.1)
Basophils Relative: 0 %
Eosinophils Absolute: 0 10*3/uL (ref 0.0–0.5)
Eosinophils Relative: 0 %
HCT: 45.7 % (ref 39.0–52.0)
Hemoglobin: 14.9 g/dL (ref 13.0–17.0)
Immature Granulocytes: 0 %
Lymphocytes Relative: 7 %
Lymphs Abs: 1 10*3/uL (ref 0.7–4.0)
MCH: 28.1 pg (ref 26.0–34.0)
MCHC: 32.6 g/dL (ref 30.0–36.0)
MCV: 86.2 fL (ref 80.0–100.0)
Monocytes Absolute: 0.6 10*3/uL (ref 0.1–1.0)
Monocytes Relative: 5 %
Neutro Abs: 11.5 10*3/uL — ABNORMAL HIGH (ref 1.7–7.7)
Neutrophils Relative %: 88 %
Platelets: 373 10*3/uL (ref 150–400)
RBC: 5.3 MIL/uL (ref 4.22–5.81)
RDW: 14.1 % (ref 11.5–15.5)
WBC: 13.2 10*3/uL — ABNORMAL HIGH (ref 4.0–10.5)
nRBC: 0 % (ref 0.0–0.2)

## 2022-11-30 LAB — RAPID URINE DRUG SCREEN, HOSP PERFORMED
Amphetamines: NOT DETECTED
Barbiturates: NOT DETECTED
Benzodiazepines: NOT DETECTED
Cocaine: POSITIVE — AB
Opiates: NOT DETECTED
Tetrahydrocannabinol: POSITIVE — AB

## 2022-11-30 LAB — I-STAT VENOUS BLOOD GAS, ED
Acid-base deficit: 2 mmol/L (ref 0.0–2.0)
Bicarbonate: 23 mmol/L (ref 20.0–28.0)
Calcium, Ion: 1.04 mmol/L — ABNORMAL LOW (ref 1.15–1.40)
HCT: 46 % (ref 39.0–52.0)
Hemoglobin: 15.6 g/dL (ref 13.0–17.0)
O2 Saturation: 99 %
Potassium: 3.5 mmol/L (ref 3.5–5.1)
Sodium: 139 mmol/L (ref 135–145)
TCO2: 24 mmol/L (ref 22–32)
pCO2, Ven: 39.5 mmHg — ABNORMAL LOW (ref 44–60)
pH, Ven: 7.373 (ref 7.25–7.43)
pO2, Ven: 147 mmHg — ABNORMAL HIGH (ref 32–45)

## 2022-11-30 LAB — TROPONIN I (HIGH SENSITIVITY): Troponin I (High Sensitivity): 5 ng/L (ref <18)

## 2022-11-30 LAB — BRAIN NATRIURETIC PEPTIDE: B Natriuretic Peptide: 73.5 pg/mL (ref 0.0–100.0)

## 2022-11-30 MED ORDER — ALBUTEROL SULFATE (2.5 MG/3ML) 0.083% IN NEBU: 2.5000 mg | INHALATION_SOLUTION | Freq: Four times a day (QID) | RESPIRATORY_TRACT | Status: DC | PRN

## 2022-11-30 MED ORDER — ALBUTEROL SULFATE HFA 108 (90 BASE) MCG/ACT IN AERS
1.0000 | INHALATION_SPRAY | Freq: Four times a day (QID) | RESPIRATORY_TRACT | 0 refills | Status: DC | PRN
Start: 2022-11-30 — End: 2023-09-21
  Filled 2022-11-30: qty 6.7, 25d supply, fill #0

## 2022-11-30 MED ORDER — ENOXAPARIN SODIUM 40 MG/0.4ML IJ SOSY: 40.0000 mg | PREFILLED_SYRINGE | INTRAMUSCULAR | Status: DC

## 2022-11-30 MED ORDER — IPRATROPIUM BROMIDE 0.02 % IN SOLN
0.5000 mg | Freq: Once | RESPIRATORY_TRACT | Status: AC
  Administered 2022-11-30: 0.5 mg via RESPIRATORY_TRACT
  Filled 2022-11-30: qty 2.5

## 2022-11-30 MED ORDER — ALBUTEROL SULFATE (2.5 MG/3ML) 0.083% IN NEBU
10.0000 mg/h | INHALATION_SOLUTION | Freq: Once | RESPIRATORY_TRACT | Status: AC
  Administered 2022-11-30: 10 mg/h via RESPIRATORY_TRACT
  Filled 2022-11-30: qty 3
  Filled 2022-11-30: qty 12

## 2022-11-30 MED ORDER — MOMETASONE FURO-FORMOTEROL FUM 200-5 MCG/ACT IN AERO
2.0000 | INHALATION_SPRAY | Freq: Two times a day (BID) | RESPIRATORY_TRACT | Status: DC
  Administered 2022-11-30: 2 via RESPIRATORY_TRACT
  Filled 2022-11-30: qty 8.8

## 2022-11-30 MED ORDER — IPRATROPIUM-ALBUTEROL 0.5-2.5 (3) MG/3ML IN SOLN: 3.0000 mL | Freq: Four times a day (QID) | RESPIRATORY_TRACT | Status: DC | PRN

## 2022-11-30 MED ORDER — ALBUTEROL SULFATE (2.5 MG/3ML) 0.083% IN NEBU
2.5000 mg | INHALATION_SOLUTION | Freq: Four times a day (QID) | RESPIRATORY_TRACT | 0 refills | Status: DC | PRN
  Filled 2022-11-30: qty 90, 8d supply, fill #0

## 2022-11-30 NOTE — ED Notes (Signed)
Trial off bipap initiated.

## 2022-11-30 NOTE — Discharge Summary (Addendum)
Name: Jason Cardenas MRN: [id number] DOB: 07-29-1960 61 y.o. PCP: Pcp, No  Date of Admission: 11/30/2022  7:53 AM Date of Discharge: 11/30/2022 Attending Physician: Dr. Cleda Daub  Discharge Diagnosis: Principal Problem:   Asthmatic bronchitis with exacerbation    Discharge Medications: Allergies as of 11/30/2022   No Known Allergies      Medication List     TAKE these medications    albuterol 108 (90 Base) MCG/ACT inhaler Commonly known as: VENTOLIN HFA Inhale 1-2 puffs into the lungs every 6 (six) hours as needed for wheezing or shortness of breath. What changed: Another medication with the same name was added. Make sure you understand how and when to take each.   albuterol (2.5 MG/3ML) 0.083% nebulizer solution Commonly known as: PROVENTIL Inhale 3 mLs (2.5 mg total) into the lungs every 6 (six) hours as needed for wheezing or shortness of breath. What changed: You were already taking a medication with the same name, and this prescription was added. Make sure you understand how and when to take each.   Fluticasone-Salmeterol 250-50 MCG/DOSE Aepb Commonly known as: ADVAIR Inhale 1 puff into the lungs daily.               Durable Medical Equipment  (From admission, onward)           Start     Ordered   11/30/22 0000  For home use only DME Nebulizer machine       Question Answer Comment  Patient needs a nebulizer to treat with the following condition Asthma exacerbation   Length of Need 6 Months      11/30/22 1335            Disposition and follow-up:   Jason Cardenas was discharged from [facility] in Good condition.  At the hospital follow up visit please address:  1.  Follow-up:  a. Asthma: evaluate symptoms, ensure compliance with Advair and albuterol  2.  Labs / imaging needed at time of follow-up: none  3.  Pending labs/ test needing follow-up: none  4.  Medication Changes  Started: none  Stopped:  none  Changed: none  Follow-up Appointments:  Follow-up Information     Champ Mungo, DO Follow up on 12/13/2022.   Specialty: Internal Medicine Why: You have an appointment on 12/13/2022 at 8:45AM with Dr. Molli Hazard information: [address] [place] [address] [phone]                 Hospital Course by problem list:  Jason Cardenas is a 62 yo male with PMH of asthma and polysubstance use who presented to the ED with SOB and was briefly admitted for acute respiratory distress secondary to an asthma exacerbation.  #Respiratory Distress #Asthma Patient presented with a few hours of SOB that was not improving with his albuterol inhaler. He was given albuterol and ipratropium and put on Bipap in the ED. EKG, CXR, trops were normal. CMP and CBC with no significant abnormalities. Urine drug screen was positive for cocaine and cannabis. Patient's breathing improved and he was taken off of bipap. Patient's respiratory distress was likely an acute asthma exacerbation. He was discharged on 8/8 with albuterol and has Advair at home. Also provided with a nebulizer.   Discharge Subjective: Patient reports that his breathing has improved. He needs a refill of albuterol after using it all this morning but still has Advair at home. Informed patient that inhaled substances, whether drugs or  particulate matter from work, can trigger asthma symptoms.  Discharge Exam:   BP 125/70   Pulse 92   Temp 98.1 F (36.7 C) (Oral)   Resp 13   Ht 5\' 10"  (1.778 m)   Wt 83.5 kg   SpO2 94%   BMI 26.40 kg/m  Constitutional: well-appearing, sitting in bed, in no acute distress HENT: normocephalic atraumatic Cardiovascular: regular rate and rhythm, no m/r/g Pulmonary/Chest: normal work of breathing on room air, lungs clear to auscultation bilaterally Abdominal: soft, non-tender, non-distended Ext: no edema Psych: normal mood and affect   Pertinent Labs, Studies, and Procedures:      Latest Ref Rng & Units 11/30/2022    8:34 AM 11/30/2022    8:10 AM 09/09/2022    6:53 AM  CBC  WBC 4.0 - 10.5 K/uL  13.2  12.8   Hemoglobin 13.0 - 17.0 g/dL 16.1  09.6  04.5   Hematocrit 39.0 - 52.0 % 46.0  45.7  41.3   Platelets 150 - 400 K/uL  373  300        Latest Ref Rng & Units 11/30/2022    8:34 AM 11/30/2022    8:10 AM 09/10/2022    2:00 AM  CMP  Glucose 70 - 99 mg/dL  409  811   BUN 6 - 20 mg/dL  10  14   Creatinine 9.14 - 1.24 mg/dL  7.82  9.56   Sodium 213 - 145 mmol/L 139  138  135   Potassium 3.5 - 5.1 mmol/L 3.5  3.5  3.8   Chloride 98 - 111 mmol/L  104  102   CO2 22 - 32 mmol/L  22  21   Calcium 8.9 - 10.3 mg/dL  8.7  8.5   Total Protein 6.5 - 8.1 g/dL  7.2    Total Bilirubin 0.3 - 1.2 mg/dL  0.7    Alkaline Phos 38 - 126 U/L  60    AST 15 - 41 U/L  20    ALT 0 - 44 U/L  20      DG Chest Portable 1 View  Result Date: 11/30/2022 CLINICAL DATA:  dyspnea EXAM: PORTABLE CHEST 1 VIEW COMPARISON:  09/08/2022. FINDINGS: Bilateral lung fields are clear. Bilateral costophrenic angles are clear. Stable cardio-mediastinal silhouette. No acute osseous abnormalities. The soft tissues are within normal limits. IMPRESSION: No active disease. Electronically Signed   By: Jules Schick M.D.   On: 11/30/2022 08:22     Discharge Instructions: Discharge Instructions     Diet - low sodium heart healthy   Complete by: As directed    For home use only DME Nebulizer machine   Complete by: As directed    Patient needs a nebulizer to treat with the following condition: Asthma exacerbation   Length of Need: 6 Months   Increase activity slowly   Complete by: As directed        Signed: Barrett Shell, Medical Student 11/30/2022, 1:39 PM   Pager: [phone]     Attestation for Student Documentation:  I personally was present and re-performed the history, physical exam and medical decision-making activities of this service and have verified that the service and findings are  accurately documented in the student's note.  Olegario Messier, MD 11/30/2022, 2:02 PM

## 2022-11-30 NOTE — H&P (Addendum)
Date: 11/30/2022               Cardenas Name:  Jason Cardenas MRN: [id number]  DOB: 1961/02/21 Age / Sex: 62 y.o., male   PCP: Pcp, No         Medical Service: Internal Medicine Teaching Service         Attending Physician: Dr. Mikey Bussing, Marthenia Rolling, DO    First Contact: Jason Cardenas, MS3 Pager: WD [phone]  Second Contact: Jason Messier, MD Pager: Matilde Sprang [phone]       After Hours (After 5p/  First Contact Pager: [phone]  weekends / holidays): Second Contact Pager: [phone]   SUBJECTIVE   Chief Complaint: Shortness of Breath  History of Present Illness:  Jason Cardenas is a 62 yo male with PMH of asthma who presented to the ED with SOB that started this morning when he woke up for work. He says he felt normal last night when he went to sleep. He woke up at 4 or 5 this morning for work and felt SOB. He used albuterol but without significant improvement. He denies cough, abdominal pain, N/V/D, fevers, chest pain. He says some people at work have been sick, some with Covid, but he has not been around them. He has hx of marijuana and cocaine use but says he has not used in 2-3 weeks. He has been hospitalized for asthma exacerbations in the past and says this feels the same. He got albuterol and ipratropium and was placed on Bipap in the ED and says he feels much better.  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   OBJECTIVE:   Physical Exam: Blood pressure 137/83, pulse 97, temperature (!) 97.5 F (36.4 C), temperature source Axillary, resp.  rate 15, height 5\' 10"  (1.778 m), weight 83.5 kg, SpO2 97%.  Constitutional: well-appearing, sitting in bed, in no acute distress, with nebulizer mask HENT: normocephalic atraumatic Cardiovascular: regular rate and rhythm, no m/r/g Pulmonary/Chest: normal work of breathing on Bipap, lungs clear to auscultation bilaterally Abdominal: soft, non-tender, non-distended Ext: nonedematous Psych: normal mood and affect  Labs: CBC Diff -WBC 13.2 -HGB 14.9 -HCT 45.7 -MCV 86.2 -Plt 373 -Neutrophils Abs 11.5  CMP  -Na 138 -K 3.5 -Cl 104 -CO2 22 -Glu 156 -BUN 10 -Cr 0.89 -Cal 8.7  VBG -pH 7.373 -pCO2 39.5 -pO2 147 -Bicarb 23.0  Troponin: 5  Imaging: DG Chest Portable 1 View  Result Date: 11/30/2022 CLINICAL DATA:  dyspnea EXAM: PORTABLE CHEST 1 VIEW COMPARISON:  09/08/2022. FINDINGS: Bilateral lung fields are clear. Bilateral costophrenic angles are clear. Stable cardio-mediastinal silhouette. No acute osseous abnormalities. The soft tissues are within normal limits. IMPRESSION: No active disease. Electronically Signed   By: Jules Schick M.D.   On: 11/30/2022 08:22    EKG: personally reviewed my interpretation is sinus tachycardia   ASSESSMENT & PLAN:    Assessment & Plan by Problem: Principal Problem:   Asthmatic bronchitis with exacerbation   Jason Cardenas is a 62 y.o. with pertinent PMH of asthma who presented with acute onset SOB and admitted for  respiratory distress on hospital day 0  #Respiratory distress #Asthma Cardenas with hx of asthma presented with acute respiratory distress. He was given albuterol and ipratropium and placed on Bipap in ED and is breathing much better. Labs are unremarkable except for slight increase in WBC to 13.2. VBG showed normal pH with hypocapnia and elevated pO2. CXR normal. Given Cardenas's history and response to ipratropium and albuterol, this is likely an acute asthma exacerbation. It is unclear what triggered this episode, but  reassuring that Cardenas is already improving. Will treat with Duonebs, Dulera, and albuterol and wean from Bipap. -Duoneb 0.5/2.5 q6 PRN -Dulera 200-5 BID -Albuterol q6 PRN -Wean bipap  #Polysubstance Use History Cardenas denies any marijuana or cocaine use in the past 2-3 weeks. I suspect this is truthful but checking urine tox to see if this contributed to asthma exacerbation.  Diet: NPO VTE: Enoxaparin IVF: None,None Code: DNR  Prior to Admission Living Arrangement: Home, living alone Anticipated Discharge Location: Home Barriers to Discharge: Stable respiratory status  Dispo: Admit Cardenas to Observation with expected length of stay less than 2 midnights.  Signed: Barrett Shell, Medical Student Internal Medicine Resident PGY-1 Pager: [phone]  11/30/2022, 11:33 AM    Attestation for Student Documentation:  I personally was present and re-performed the history, physical exam and medical decision-making activities of this service and have verified that the service and findings are accurately documented in the student's note.  Jason Messier, MD 11/30/2022, 11:49 AM

## 2022-11-30 NOTE — Discharge Planning (Signed)
RNCM consulted for medication assistance. RNCM reviewed chart and spoke with the pt about [facility] MATCH program ($3 co pay for each Rx through MATCH program, does not include refills, 7 day expiration of MATCH letter and choice of pharmacies). Pt is eligible for [facility] M[facility] program (unable to find pt listed in PROCARE per cardholder name inquiry) and has agreed to accept MATCH with co-pay override. PROCARE information entered; [facility] to fill and deliver to pt at bedside prior to discharge home today. RNCM updated EDP and ED RN.     

## 2022-11-30 NOTE — Discharge Planning (Signed)
MATCH Medication Assistance Card *Pharmacies please call [phone] for claim processing assistance   Name:DarrylDearman                                                                                                                                                                                                                                 Relationship Code:  1 ID(MRN):[id number]                                                                                                                                                                                                                          Person Code:  01 Bin: [id number] RX Group: [id number] Discharge Date: 11/30/2022                                 RX PCN:  [id number] Expiration Date:12/08/2022                                           (must be filled within 7 days of discharge)     You have been approved to have the prescriptions written by your discharging physician filled through our [facility] (Medication Assistance Through [facility]) program. This program allows for a one-time (no refills) 34-day supply of selected  medications for a low copay amount.  The copay is $0 per prescription.   Only certain pharmacies are participating in this program with [facility]. You will need to select one of the pharmacies from the attached list and take your prescriptions, this letter, and your photo ID to one of the [facility] pharmacies, [facility], [facility] at [address], or [facility] [address].   We are excited that you are able to use the [facility] program to get your medications. These prescriptions must be filled within 7 days of hospital discharge or they will no longer be valid for the [facility] program. Should you have any problems with your prescriptions please contact your case management team member at [phone] for [place]/ [facility]  Thank  you,   [facility]

## 2022-11-30 NOTE — Hospital Course (Addendum)
Jason Cardenas is a 62 yo male with PMH of asthma and polysubstance use who presented to the ED with SOB and was briefly admitted for acute respiratory distress secondary to an asthma exacerbation.  #Respiratory Distress #Asthma Patient presented with a few hours of SOB that was not improving with his albuterol inhaler. He was given albuterol and ipratropium and put on Bipap in the ED. EKG, CXR, trops were normal. CMP and CBC with no significant abnormalities. Urine drug screen was positive for cocaine and cannabis. Patient's breathing improved and he was taken off of bipap. Patient's respiratory distress was likely an acute asthma exacerbation. He was discharged on 8/8 with albuterol and has Advair at home. Also provided with a nebulizer.

## 2022-11-30 NOTE — Discharge Instructions (Signed)
It was a pleasure being a part of your care. You were brought in for an asthma exacerbation. I would recommend stopping the use of cocaine and marijuana, as these are likely the source of these exacerbations. You can also consider wearing a mask at work to help protect your lungs from inhaling things you shouldn't be.   For your medications, I recommend continuing the advair, and you should be leaving with a nebulizer and solution to use. I am also refilling your albuterol inhaler.   I have made you a follow up appointment with our clinic located in the basement of [place] on 8/21 at 8:45AM. Please attend so you can get regular refills of your medications.

## 2022-11-30 NOTE — ED Triage Notes (Signed)
Pt bib ems from home; hx asthma; woke this am, started having SOB while getting ready for work; wheezing in all fields; 0.6 mg epi IM given pta; 125 solumedrol, 2 mg mag; 15 mg albuterol, 1 Atrovent given pta; sats 97 % on neb; HR 115, bp 122 systolic, 100 ns given pta; RR 30; 20 ga LAC

## 2022-11-30 NOTE — ED Provider Notes (Signed)
[place] EMERGENCY DEPARTMENT AT [facility] Provider Note  CSN: [id number] Arrival date & time: 11/30/22 4098  Chief Complaint(s) Respiratory Distress  HPI Jason Cardenas is a 62 y.o. male with PMH polysubstance use with multiple hospital admissions for bronchospasm and resultant asthma exacerbations who presents emerged part for evaluation of respiratory distress.  Patient awoke this morning with sudden onset symptoms of shortness of breath and wheezing.  Received 2 doses of epinephrine IM, 125 Solu-Medrol, 2 mg magnesium, 15 of albuterol and 1 of Atrovent prior to arrival with EMS.  Patient arrives in respiratory distress with significant accessory muscle use and diaphoresis.  Endorses shortness of breath but denies abdominal pain, nausea, vomiting, headache, fever or other systemic symptoms.   Past Medical History Past Medical History:  Diagnosis Date   Asthma    Prediabetes    Patient Active Problem List   Diagnosis Date Noted   Acute respiratory failure with hypoxia and hypercapnia (HCC) 09/08/2022   Substance abuse (HCC) 09/08/2022   Acute metabolic encephalopathy 09/08/2022   Malnutrition of moderate degree 09/08/2022   Asthma 01/28/2022   Asthma due to environmental allergies 12/02/2018   Allergic rhinitis due to allergen 02/27/2018   Severe persistent asthma 11/17/2011   Asthmatic bronchitis with exacerbation 10/02/2011   Home Medication(s) Prior to Admission medications   Medication Sig Start Date End Date Taking? Authorizing Provider  acetaminophen (TYLENOL) 325 MG tablet Take 650 mg by mouth every 6 (six) hours as needed for mild pain.    [provider]  albuterol (VENTOLIN HFA) 108 (90 Base) MCG/ACT inhaler Inhale 1-2 puffs into the lungs every 6 (six) hours as needed for wheezing or shortness of breath. 01/29/22   Rai, Ripudeep K, MD  albuterol (VENTOLIN HFA) 108 (90 Base) MCG/ACT inhaler Inhale 1 puff into the lungs every 6 (six) hours as  needed for wheezing or shortness of breath.    [provider]  cetirizine (ZYRTEC ALLERGY) 10 MG tablet Take 1 tablet (10 mg total) by mouth daily as needed for allergies (Also available over-the-counter). 01/29/22   Rai, Ripudeep K, MD  Fluticasone-Salmeterol (ADVAIR) 250-50 MCG/DOSE AEPB Inhale 1 puff into the lungs daily. 02/05/22   Molpus, John, MD  guaifenesin (ROBITUSSIN) 100 MG/5ML syrup Take 200 mg by mouth 3 (three) times daily as needed for cough.    [provider]  predniSONE (DELTASONE) 10 MG tablet Take 4 tablets daily X 2 days, then, Take 3 tablets daily X 2 days, then, Take 2 tablets daily X 2 days, then, Take 1 tablets daily X 1 day. 09/10/22   Lorin Glass, MD  predniSONE (DELTASONE) 20 MG tablet Take 60 mg daily x 2 days then 40 mg daily x 2 days then 20 mg daily x 2 days 08/27/22   Charlynne Pander, MD  Past Surgical History Past Surgical History:  Procedure Laterality Date   TONSILLECTOMY     Family History Family History  Problem Relation Age of Onset   Diabetes Mother     Social History Social History   Tobacco Use   Smoking status: Never   Smokeless tobacco: Never  Vaping Use   Vaping status: Never Used  Substance Use Topics   Alcohol use: Yes    Comment: socially    Drug use: No   Allergies Patient has no known allergies.  Review of Systems Review of Systems  Respiratory:  Positive for shortness of breath.     Physical Exam Vital Signs  I have reviewed the triage vital signs BP 118/71   Pulse (!) 110   Resp (!) 21   Ht 5\' 10"  (1.778 m)   Wt 83.5 kg   SpO2 99%   BMI 26.40 kg/m   Physical Exam Constitutional:      General: He is in acute distress.     Appearance: Normal appearance. He is ill-appearing.  HENT:     Head: Normocephalic and atraumatic.     Nose: No congestion or rhinorrhea.   Eyes:     General:        Right eye: No discharge.        Left eye: No discharge.     Extraocular Movements: Extraocular movements intact.     Pupils: Pupils are equal, round, and reactive to light.  Cardiovascular:     Rate and Rhythm: Regular rhythm. Tachycardia present.     Heart sounds: No murmur heard. Pulmonary:     Effort: Respiratory distress present.     Breath sounds: Wheezing present. No rales.  Abdominal:     General: There is no distension.     Tenderness: There is no abdominal tenderness.  Musculoskeletal:        General: Normal range of motion.     Cervical back: Normal range of motion.  Skin:    General: Skin is warm and dry.  Neurological:     General: No focal deficit present.     Mental Status: He is alert.     ED Results and Treatments Labs (all labs ordered are listed, but only abnormal results are displayed) Labs Reviewed  COMPREHENSIVE METABOLIC PANEL  CBC WITH DIFFERENTIAL/PLATELET  BRAIN NATRIURETIC PEPTIDE  BLOOD GAS, VENOUS  TROPONIN I (HIGH SENSITIVITY)                                                                                                                          Radiology No results found.  Pertinent labs & imaging results that were available during my care of the patient were reviewed by me and considered in my medical decision making (see MDM for details).  Medications Ordered in ED Medications  albuterol (PROVENTIL) (2.5 MG/3ML) 0.083% nebulizer solution (has no administration in time range)  ipratropium (ATROVENT) nebulizer solution 0.5 mg (has no administration in time range)  Procedures .Critical Care  Performed by: Glendora Score, MD Authorized by: Glendora Score, MD   Critical care provider statement:    Critical care time (minutes):  30   Critical care was necessary to treat or  prevent imminent or life-threatening deterioration of the following conditions:  Respiratory failure   Critical care was time spent personally by me on the following activities:  Development of treatment plan with patient or surrogate, discussions with consultants, evaluation of patient's response to treatment, examination of patient, ordering and review of laboratory studies, ordering and review of radiographic studies, ordering and performing treatments and interventions, pulse oximetry, re-evaluation of patient's condition and review of old charts   (including critical care time)  Medical Decision Making / ED Course   This patient presents to the ED for concern of shortness of breath, this involves an extensive number of treatment options, and is a complaint that carries with it a high risk of complications and morbidity.  The differential diagnosis includes Pe, PTX, Pulmonary Edema, ARDS, COPD/Asthma, ACS, CHF exacerbation, Arrhythmia, Pericardial Effusion/Tamponade, Anemia, Sepsis, Acidosis/Hypercapnia, Anxiety, Viral URI  MDM: Patient seen emerged part for evaluation of shortness of breath.  Physical exam reveals wheezing in all lung fields, accessory muscle use, diaphoresis and tachypnea.  Patient transition to BiPAP and a continuous albuterol treatment with 0.5 Atrovent run through the BiPAP.  Laboratory evaluation with leukocytosis to 13.2 but is otherwise unremarkable.  High-sensitivity troponin and BNP are unremarkable.  pH 7.37 with no significant hypercarbia.  Patient remains on BiPAP and require hospital admission for asthma exacerbation requiring BiPAP.  Patient admitted.   Additional history obtained:  -External records from outside source obtained and reviewed including: Chart review including previous notes, labs, imaging, consultation notes   Lab Tests: -I ordered, reviewed, and interpreted labs.   The pertinent results include:   Labs Reviewed  COMPREHENSIVE METABOLIC  PANEL  CBC WITH DIFFERENTIAL/PLATELET  BRAIN NATRIURETIC PEPTIDE  BLOOD GAS, VENOUS  TROPONIN I (HIGH SENSITIVITY)      EKG   EKG Interpretation Date/Time:  Thursday November 30 2022 08:06:32 EDT Ventricular Rate:  110 PR Interval:  157 QRS Duration:  85 QT Interval:  351 QTC Calculation: 475 R Axis:   78  Text Interpretation: Sinus tachycardia Probable left atrial enlargement Confirmed by ,  ([id number]) on 11/30/2022 8:54:46 AM         Imaging Studies ordered: I ordered imaging studies including chest x-ray I independently visualized and interpreted imaging. I agree with the radiologist interpretation   Medicines ordered and prescription drug management: Meds ordered this encounter  Medications   albuterol (PROVENTIL) (2.5 MG/3ML) 0.083% nebulizer solution   ipratropium (ATROVENT) nebulizer solution 0.5 mg    -I have reviewed the patients home medicines and have made adjustments as needed  Critical interventions BiPAP, continuous albuterol, Atrovent    Cardiac Monitoring: The patient was maintained on a cardiac monitor.  I personally viewed and interpreted the cardiac monitored which showed an underlying rhythm of: NSR, sinus tachycardia  Social Determinants of Health:  Factors impacting patients care include: History of polysubstance use   Reevaluation: After the interventions noted above, I reevaluated the patient and found that they have :improved  Co morbidities that complicate the patient evaluation  Past Medical History:  Diagnosis Date   Asthma    Prediabetes       Dispostion: I considered admission for this patient, and due to his severe asthma exacerbation requiring BiPAP patient require hospital admission  Final Clinical Impression(s) / ED Diagnoses Final diagnoses:  None     @PCDICTATION @    Glendora Score, MD 11/30/22 1000

## 2022-11-30 NOTE — ED Notes (Signed)
ED TO INPATIENT HANDOFF REPORT  ED Nurse Name and Phone #: [phone]  S Name/Age/Gender Jason Cardenas 62 y.o. male Room/Bed: 015C/015C  Code Status   Code Status: DNR  Home/SNF/Other Home Patient oriented to: self, place, time, and situation Is this baseline? Yes   Triage Complete: Triage complete  Chief Complaint Asthma exacerbation [J45.901]  Triage Note Pt bib ems from home; hx asthma; woke this am, started having SOB while getting ready for work; wheezing in all fields; 0.6 mg epi IM given pta; 125 solumedrol, 2 mg mag; 15 mg albuterol, 1 Atrovent given pta; sats 97 % on neb; HR 115, bp 122 systolic, 100 ns given pta; RR 30; 20 ga LAC   Allergies No Known Allergies  Level of Care/Admitting Diagnosis ED Disposition     ED Disposition  Admit   Condition  --   Comment  Hospital Area: [facility] [[id number]]  Level of Care: Med-Surg [16]  May place patient in observation at [facility] or [facility] if equivalent level of care is available:: Yes  Covid Evaluation: Asymptomatic - no recent exposure (last 10 days) testing not required  Diagnosis: Asthma exacerbation [366440]  Admitting Physician: Gust Rung [[id number]]  Attending Physician: Gust Rung [[id number]]          B Medical/Surgery History Past Medical History:  Diagnosis Date   Asthma    Prediabetes    Past Surgical History:  Procedure Laterality Date   TONSILLECTOMY       A IV Location/Drains/Wounds Patient Lines/Drains/Airways Status     Active Line/Drains/Airways     Name Placement date Placement time Site Days   Peripheral IV 11/30/22 18 G Anterior;Right Forearm 11/30/22  0810  Forearm  less than 1   Peripheral IV 11/30/22 20 G Left Antecubital 11/30/22  0817  Antecubital  less than 1            Intake/Output Last 24 hours No intake or output data in the 24 hours ending 11/30/22 1132  Labs/Imaging Results for orders placed or performed during the hospital  encounter of 11/30/22 (from the past 48 hour(s))  Comprehensive metabolic panel     Status: Abnormal   Collection Time: 11/30/22  8:10 AM  Result Value Ref Range   Sodium 138 135 - 145 mmol/L   Potassium 3.5 3.5 - 5.1 mmol/L   Chloride 104 98 - 111 mmol/L   CO2 22 22 - 32 mmol/L   Glucose, Bld 156 (H) 70 - 99 mg/dL    Comment: Glucose reference range applies only to samples taken after fasting for at least 8 hours.   BUN 10 6 - 20 mg/dL   Creatinine, Ser 3.47 0.61 - 1.24 mg/dL   Calcium 8.7 (L) 8.9 - 10.3 mg/dL   Total Protein 7.2 6.5 - 8.1 g/dL   Albumin 4.0 3.5 - 5.0 g/dL   AST 20 15 - 41 U/L   ALT 20 0 - 44 U/L   Alkaline Phosphatase 60 38 - 126 U/L   Total Bilirubin 0.7 0.3 - 1.2 mg/dL   GFR, Estimated >42 >59 mL/min    Comment: (NOTE) Calculated using the CKD-EPI Creatinine Equation (2021)    Anion gap 12 5 - 15    Comment: Performed at [facility], [address], [place], [place] [address]  Troponin I (High Sensitivity)     Status: None   Collection Time: 11/30/22  8:10 AM  Result Value Ref Range   Troponin  I (High Sensitivity) 5 <18 ng/L    Comment: (NOTE) Elevated high sensitivity troponin I (hsTnI) values and significant  changes across serial measurements may suggest ACS but many other  chronic and acute conditions are known to elevate hsTnI results.  Refer to the "Links" section for chest pain algorithms and additional  guidance. Performed at [facility], [address], [place], [place] [address]   CBC with Differential     Status: Abnormal   Collection Time: 11/30/22  8:10 AM  Result Value Ref Range   WBC 13.2 (H) 4.0 - 10.5 K/uL   RBC 5.30 4.22 - 5.81 MIL/uL   Hemoglobin 14.9 13.0 - 17.0 g/dL   HCT 13.0 86.5 - 78.4 %   MCV 86.2 80.0 - 100.0 fL   MCH 28.1 26.0 - 34.0 pg   MCHC 32.6 30.0 - 36.0 g/dL   RDW 69.6 29.5 - 28.4 %   Platelets 373 150 - 400 K/uL   nRBC 0.0 0.0 - 0.2 %   Neutrophils Relative % 88 %   Neutro Abs 11.5 (H) 1.7 - 7.7  K/uL   Lymphocytes Relative 7 %   Lymphs Abs 1.0 0.7 - 4.0 K/uL   Monocytes Relative 5 %   Monocytes Absolute 0.6 0.1 - 1.0 K/uL   Eosinophils Relative 0 %   Eosinophils Absolute 0.0 0.0 - 0.5 K/uL   Basophils Relative 0 %   Basophils Absolute 0.0 0.0 - 0.1 K/uL   Immature Granulocytes 0 %   Abs Immature Granulocytes 0.05 0.00 - 0.07 K/uL    Comment: Performed at [facility], [address], [place], [place] [address]  Brain natriuretic peptide     Status: None   Collection Time: 11/30/22  8:10 AM  Result Value Ref Range   B Natriuretic Peptide 73.5 0.0 - 100.0 pg/mL    Comment: Performed at [facility], [address], [place], [place] [address]  I-Stat venous blood gas, [facility], [facility], [facility])     Status: Abnormal   Collection Time: 11/30/22  8:34 AM  Result Value Ref Range   pH, Ven 7.373 7.25 - 7.43   pCO2, Ven 39.5 (L) 44 - 60 mmHg   pO2, Ven 147 (H) 32 - 45 mmHg   Bicarbonate 23.0 20.0 - 28.0 mmol/L   TCO2 24 22 - 32 mmol/L   O2 Saturation 99 %   Acid-base deficit 2.0 0.0 - 2.0 mmol/L   Sodium 139 135 - 145 mmol/L   Potassium 3.5 3.5 - 5.1 mmol/L   Calcium, Ion 1.04 (L) 1.15 - 1.40 mmol/L   HCT 46.0 39.0 - 52.0 %   Hemoglobin 15.6 13.0 - 17.0 g/dL   Sample type VENOUS   Rapid urine drug screen (hospital performed)     Status: Abnormal   Collection Time: 11/30/22 10:37 AM  Result Value Ref Range   Opiates NONE DETECTED NONE DETECTED   Cocaine POSITIVE (A) NONE DETECTED   Benzodiazepines NONE DETECTED NONE DETECTED   Amphetamines NONE DETECTED NONE DETECTED   Tetrahydrocannabinol POSITIVE (A) NONE DETECTED   Barbiturates NONE DETECTED NONE DETECTED    Comment: (NOTE) DRUG SCREEN FOR MEDICAL PURPOSES ONLY.  IF CONFIRMATION IS NEEDED FOR ANY PURPOSE, NOTIFY LAB WITHIN 5 DAYS.  LOWEST DETECTABLE LIMITS FOR URINE DRUG SCREEN Drug Class                     Cutoff (ng/mL) Amphetamine and metabolites    1000  Barbiturate and metabolites    200 Benzodiazepine                  200 Opiates and metabolites        300 Cocaine and metabolites        300 THC                            50 Performed at [facility], [address] [place], [place] [address]    DG Chest Portable 1 View  Result Date: 11/30/2022 CLINICAL DATA:  dyspnea EXAM: PORTABLE CHEST 1 VIEW COMPARISON:  09/08/2022. FINDINGS: Bilateral lung fields are clear. Bilateral costophrenic angles are clear. Stable cardio-mediastinal silhouette. No acute osseous abnormalities. The soft tissues are within normal limits. IMPRESSION: No active disease. Electronically Signed   By: Jules Schick M.D.   On: 11/30/2022 08:22    Pending Labs Unresulted Labs (From admission, onward)     Start     Ordered   12/01/22 0500  Basic metabolic panel  Tomorrow morning,   R        11/30/22 1123   12/01/22 0500  CBC  Tomorrow morning,   R        11/30/22 1123            Vitals/Pain Today's Vitals   11/30/22 0830 11/30/22 0845 11/30/22 0900 11/30/22 0930  BP: 135/85 136/77 137/83   Pulse: 99 98 97   Resp: 17 15 15    Temp:      TempSrc:      SpO2: 98% 98% 97%   Weight:      Height:      PainSc:    0-No pain    Isolation Precautions No active isolations  Medications Medications  enoxaparin (LOVENOX) injection 40 mg (has no administration in time range)  ipratropium-albuterol (DUONEB) 0.5-2.5 (3) MG/3ML nebulizer solution 3 mL (has no administration in time range)  albuterol (VENTOLIN HFA) 108 (90 Base) MCG/ACT inhaler 1-2 puff (has no administration in time range)  mometasone-formoterol (DULERA) 200-5 MCG/ACT inhaler 2 puff (has no administration in time range)  albuterol (PROVENTIL) (2.5 MG/3ML) 0.083% nebulizer solution (10 mg/hr Nebulization Given 11/30/22 0812)  ipratropium (ATROVENT) nebulizer solution 0.5 mg (0.5 mg Nebulization Given 11/30/22 0812)    Mobility Walks with out device, however, currently on bipap    Focused Assessments Pulmonary Assessment Handoff:  Lung  sounds: Bilateral Breath Sounds: Diminished L Breath Sounds: Expiratory wheezes R Breath Sounds: Expiratory wheezes O2 Device: Bi-PAP      R Recommendations: See Admitting Provider Note  Report given to:   Additional Notes:

## 2022-12-13 ENCOUNTER — Ambulatory Visit: Payer: PRIVATE HEALTH INSURANCE | Admitting: Internal Medicine

## 2022-12-13 NOTE — Progress Notes (Deleted)
Severe persistent asthma PSUD  In the last 1 year Jason Cardenas has presented to the ED 6 times for respiratory-related symptoms. He was recently admitted briefly on 08/08 after presenting with respiratory distress on waking. He received 2 doses of epinephrine, solumedrol, magnesium, albuterol, atrovent prior to arrival to ED. Despite these interventions he continued to have wheezing diffusely on arrival and was placed on BiPAP with continuous albuterol and atrovent. He had a leukocytosis of 13.2, troponin and BNP were WNL, pH 7.37 without significant hypercarbia. CXR was normal. UDS was positive for cocaine and cannabis. He was ultimately discharged on advair and albuterol. He was counseled on substance abuse and potential for ongoing exacerbations.   Today, ***. Plan:

## 2023-09-21 ENCOUNTER — Encounter: Payer: Self-pay | Admitting: Emergency Medicine

## 2023-09-21 ENCOUNTER — Ambulatory Visit
Admission: EM | Admit: 2023-09-21 | Discharge: 2023-09-21 | Disposition: A | Discharge status: Home / Self Care | Payer: PRIVATE HEALTH INSURANCE | Attending: Physician Assistant | Admitting: Physician Assistant

## 2023-09-21 ENCOUNTER — Ambulatory Visit: Payer: Self-pay | Admitting: Physician Assistant

## 2023-09-21 ENCOUNTER — Ambulatory Visit (INDEPENDENT_AMBULATORY_CARE_PROVIDER_SITE_OTHER): Payer: PRIVATE HEALTH INSURANCE

## 2023-09-21 DIAGNOSIS — J4541 Moderate persistent asthma with (acute) exacerbation: Secondary | ICD-10-CM | POA: Diagnosis not present

## 2023-09-21 DIAGNOSIS — R0602 Shortness of breath: Secondary | ICD-10-CM

## 2023-09-21 MED ORDER — METHYLPREDNISOLONE SODIUM SUCC 125 MG IJ SOLR
80.0000 mg | Freq: Once | INTRAMUSCULAR | Status: AC
  Administered 2023-09-21: 80 mg via INTRAMUSCULAR

## 2023-09-21 MED ORDER — FLUTICASONE-SALMETEROL 250-50 MCG/DOSE IN AEPB
1.0000 | INHALATION_SPRAY | Freq: Every day | RESPIRATORY_TRACT | 2 refills | Status: AC
Start: 2023-09-21 — End: ?

## 2023-09-21 MED ORDER — IPRATROPIUM-ALBUTEROL 0.5-2.5 (3) MG/3ML IN SOLN
3.0000 mL | Freq: Once | RESPIRATORY_TRACT | Status: AC
  Administered 2023-09-21: 3 mL via RESPIRATORY_TRACT

## 2023-09-21 MED ORDER — ALBUTEROL SULFATE HFA 108 (90 BASE) MCG/ACT IN AERS: 1.0000 | INHALATION_SPRAY | Freq: Four times a day (QID) | RESPIRATORY_TRACT | 1 refills | Status: DC | PRN

## 2023-09-21 MED ORDER — PREDNISONE 10 MG (21) PO TBPK: ORAL_TABLET | Freq: Every day | ORAL | 0 refills | Status: AC

## 2023-09-21 MED ORDER — ALBUTEROL SULFATE (2.5 MG/3ML) 0.083% IN NEBU: 2.5000 mg | INHALATION_SOLUTION | Freq: Four times a day (QID) | RESPIRATORY_TRACT | 0 refills | Status: AC | PRN

## 2023-09-21 NOTE — ED Provider Notes (Signed)
 [facility]    CSN: [id number] Arrival date & time: 09/21/23  1217      History   Chief Complaint Chief Complaint  Patient presents with   asthma exacerbation    HPI Jason Cardenas is a 63 y.o. male.   Patient presents today with an hour-long history of shortness of breath, cough, wheezing.  He does have a history of asthma and has been hospitalized with his last episode of hospitalization August 2024.  He has previously been prescribed Advair but has not been using this medications regularly as he ran out.  He called EMS yesterday and then given breathing treatment with improvement of symptoms and so they did not transport him to the hospital.  He was feeling better until about an hour ago.  He denies any recent antibiotics or steroids.  He has not been take any over-the-counter medication for symptom management.    Past Medical History:  Diagnosis Date   Asthma    Prediabetes     Patient Active Problem List   Diagnosis Date Noted   Acute respiratory failure with hypoxia and hypercapnia (HCC) 09/08/2022   Polysubstance abuse (HCC) 09/08/2022   Acute metabolic encephalopathy 09/08/2022   Malnutrition of moderate degree 09/08/2022   Asthma 01/28/2022   Asthma due to environmental allergies 12/02/2018   Allergic rhinitis due to allergen 02/27/2018   Severe persistent asthma with exacerbation 11/17/2011   Asthmatic bronchitis with exacerbation 10/02/2011    Past Surgical History:  Procedure Laterality Date   TONSILLECTOMY         Home Medications    Prior to Admission medications   Medication Sig Start Date End Date Taking? Authorizing Provider  predniSONE  (STERAPRED UNI-PAK 21 TAB) 10 MG (21) TBPK tablet Take by mouth daily. Take 6 tabs by mouth daily  for 2 days, then 5 tabs for 2 days, then 4 tabs for 2 days, then 3 tabs for 2 days, 2 tabs for 2 days, then 1 tab by mouth daily for 2 days 09/21/23  Yes Braheem Tomasik K, PA-C  albuterol  (PROVENTIL )  (2.5 MG/3ML) 0.083% nebulizer solution Inhale 3 mLs (2.5 mg total) into the lungs every 6 (six) hours as needed for wheezing or shortness of breath. 09/21/23   Matrice Herro K, PA-C  albuterol  (VENTOLIN  HFA) 108 (90 Base) MCG/ACT inhaler Inhale 1-2 puffs into the lungs every 6 (six) hours as needed for wheezing or shortness of breath. 09/21/23   Psalm Schappell K, PA-C  Fluticasone -Salmeterol (ADVAIR) 250-50 MCG/DOSE AEPB Inhale 1 puff into the lungs daily. 09/21/23   Reese Senk, Betsey Brow, PA-C    Family History Family History  Problem Relation Age of Onset   Diabetes Mother     Social History Social History   Tobacco Use   Smoking status: Never   Smokeless tobacco: Never  Vaping Use   Vaping status: Never Used  Substance Use Topics   Alcohol use: Yes    Comment: socially    Drug use: No     Allergies   Patient has no known allergies.   Review of Systems Review of Systems  Constitutional:  Negative for activity change, appetite change, fatigue and fever.  HENT:  Negative for congestion and sore throat.   Respiratory:  Positive for cough, chest tightness and shortness of breath. Negative for wheezing.   Cardiovascular:  Negative for chest pain.  Gastrointestinal:  Negative for abdominal pain, diarrhea, nausea and vomiting.  Neurological:  Negative for dizziness, light-headedness and headaches.  Physical Exam Triage Vital Signs ED Triage Vitals  Encounter Vitals Group     BP 09/21/23 1223 136/82     Systolic BP Percentile --      Diastolic BP Percentile --      Pulse Rate 09/21/23 1223 (!) 103     Resp 09/21/23 1223 (!) 26     Temp 09/21/23 1223 (!) 97.5 F (36.4 C)     Temp Source 09/21/23 1223 Oral     SpO2 09/21/23 1223 96 %     Weight --      Height --      Head Circumference --      Peak Flow --      Pain Score 09/21/23 1224 7     Pain Loc --      Pain Education --      Exclude from Growth Chart --    No data found.  Updated Vital Signs BP 136/82 (BP  Location: Left Arm)   Pulse 88   Temp (!) 97.5 F (36.4 C) (Oral)   Resp 18   SpO2 96%   Visual Acuity Right Eye Distance:   Left Eye Distance:   Bilateral Distance:    Right Eye Near:   Left Eye Near:    Bilateral Near:     Physical Exam Vitals reviewed.  Constitutional:      General: He is awake.     Appearance: Normal appearance. He is well-developed. He is ill-appearing.     Comments: Very pleasant male appear stated age obviously uncomfortable and breathing through pursed lips but in no acute distress  HENT:     Head: Normocephalic and atraumatic.     Mouth/Throat:     Pharynx: Uvula midline. No oropharyngeal exudate, posterior oropharyngeal erythema or uvula swelling.  Cardiovascular:     Rate and Rhythm: Regular rhythm. Tachycardia present.     Heart sounds: Normal heart sounds, S1 normal and S2 normal. No murmur heard. Pulmonary:     Effort: Tachypnea and accessory muscle usage present.     Breath sounds: No stridor. Examination of the right-lower field reveals decreased breath sounds. Examination of the left-lower field reveals decreased breath sounds. Decreased breath sounds present. No wheezing, rhonchi or rales.     Comments: On initial evaluation patient was tachypneic with accessory muscle use and a tripod position with decreased breath sounds.  He was given Solu-Medrol  and DuoNeb in clinic with significant improvement of symptoms and had normalization of his respiratory rate with improvement of breath sounds and appropriate oxygenation throughout. Abdominal:     General: Bowel sounds are normal.     Palpations: Abdomen is soft.     Tenderness: There is no abdominal tenderness.  Neurological:     Mental Status: He is alert.  Psychiatric:        Behavior: Behavior is cooperative.      UC Treatments / Results  Labs (all labs ordered are listed, but only abnormal results are displayed) Labs Reviewed - No data to display  EKG   Radiology No results  found.  Procedures Procedures (including critical care time)  Medications Ordered in UC Medications  methylPREDNISolone  sodium succinate (SOLU-MEDROL ) 125 mg/2 mL injection 80 mg (80 mg Intramuscular Given 09/21/23 1231)  ipratropium-albuterol  (DUONEB) 0.5-2.5 (3) MG/3ML nebulizer solution 3 mL (3 mLs Nebulization Given 09/21/23 1231)    Initial Impression / Assessment and Plan / UC Course  I have reviewed the triage vital signs and the nursing notes.  Pertinent labs &  imaging results that were available during my care of the patient were reviewed by me and considered in my medical decision making (see chart for details).     Patient was initially tachypneic, tachycardic, ill-appearing, however, he significantly improved following treatment in clinic and had normalization of his heart rate and respiratory rate with appropriate oxygenation throughout his visit.  He denied any additional URI symptoms and so viral testing was deferred.  Suspect asthma exacerbation as etiology of symptoms.  He was given 80 mg of Solu-Medrol  in clinic and will start prednisone  tomorrow (09/22/2023) with instructions to take NSAIDs with this medication and risk of GI bleeding.  He can use Tylenol  for breakthrough pain.  He was sent home with nebulizer as well as albuterol  nebulizer solution and inhaler to be used every 4-6 hours as needed for shortness of breath.  Will start maintenance medication of Advair and he was encouraged to rinse his mouth following use of this medication.  Chest x-ray was obtained that showed no focal consolidation based on my primary read.  If radiology sees something different and we need to change his treatment plan we will contact him with additional instructions.  Recommend close follow-up with his primary care.  We discussed that if anything worsens or changes he needs to be seen emergently.  Strict return precautions given.  All questions were answered to patient satisfaction he expressed  agreement with treatment plan.  Final Clinical Impressions(s) / UC Diagnoses   Final diagnoses:  Shortness of breath  Moderate persistent asthma with acute exacerbation     Discharge Instructions      I am glad that you are feeling better after the medication.  Please start prednisone  tomorrow (09/22/2023).  Do not take NSAIDs with this medication including aspirin, ibuprofen/Advil, naproxen/Aleve.  You can use Tylenol  as needed.  Start Advair daily.  Rinse your mouth following use of this medication.  Use the albuterol  nebulizer solution every 4-6 hours as needed.  Follow-up with your primary care next week.  If anything worsens and you have ongoing shortness of breath, fever, nausea, vomiting you need to be seen immediately.   ED Prescriptions     Medication Sig Dispense Auth. Provider   albuterol  (VENTOLIN  HFA) 108 (90 Base) MCG/ACT inhaler Inhale 1-2 puffs into the lungs every 6 (six) hours as needed for wheezing or shortness of breath. 18 g Betzabeth Derringer K, PA-C   albuterol  (PROVENTIL ) (2.5 MG/3ML) 0.083% nebulizer solution Inhale 3 mLs (2.5 mg total) into the lungs every 6 (six) hours as needed for wheezing or shortness of breath. 90 mL Latamara Melder K, PA-C   predniSONE  (STERAPRED UNI-PAK 21 TAB) 10 MG (21) TBPK tablet Take by mouth daily. Take 6 tabs by mouth daily  for 2 days, then 5 tabs for 2 days, then 4 tabs for 2 days, then 3 tabs for 2 days, 2 tabs for 2 days, then 1 tab by mouth daily for 2 days 42 tablet Donice Alperin K, PA-C   Fluticasone -Salmeterol (ADVAIR) 250-50 MCG/DOSE AEPB Inhale 1 puff into the lungs daily. 60 each Amanat Hackel K, PA-C      PDMP not reviewed this encounter.   Budd Cargo, PA-C 09/21/23 1353

## 2023-09-21 NOTE — ED Notes (Signed)
 Provided pt with nebulizer through [facility] program. Form completed with Raspet, PA-C.

## 2023-09-21 NOTE — Discharge Instructions (Signed)
 I am glad that you are feeling better after the medication.  Please start prednisone  tomorrow (09/22/2023).  Do not take NSAIDs with this medication including aspirin, ibuprofen/Advil, naproxen/Aleve.  You can use Tylenol  as needed.  Start Advair daily.  Rinse your mouth following use of this medication.  Use the albuterol  nebulizer solution every 4-6 hours as needed.  Follow-up with your primary care next week.  If anything worsens and you have ongoing shortness of breath, fever, nausea, vomiting you need to be seen immediately.

## 2023-09-21 NOTE — ED Triage Notes (Signed)
 Pt is having active asthma exacerbation that started ~1hr ago. States He had another last night and EMS came to his home and did an albuterol  nebulizer treatment that lasted until an hr ago. He needs refill of his neb txts and inhaler. Labored breathing in triage. Pulse ox 94-97% on RA

## 2023-10-24 IMAGING — DX DG CHEST 1V PORT
1 series · 2 of 2 positions shown · non-contrast
Comparison: Previous studies including the examination of
11/29/2020

CLINICAL DATA: Shortness of breath

EXAM:
PORTABLE CHEST 1 VIEW

[Series 1: chest · 0.14mm/px · 2 of 2 slices shown]
[im 1/2]
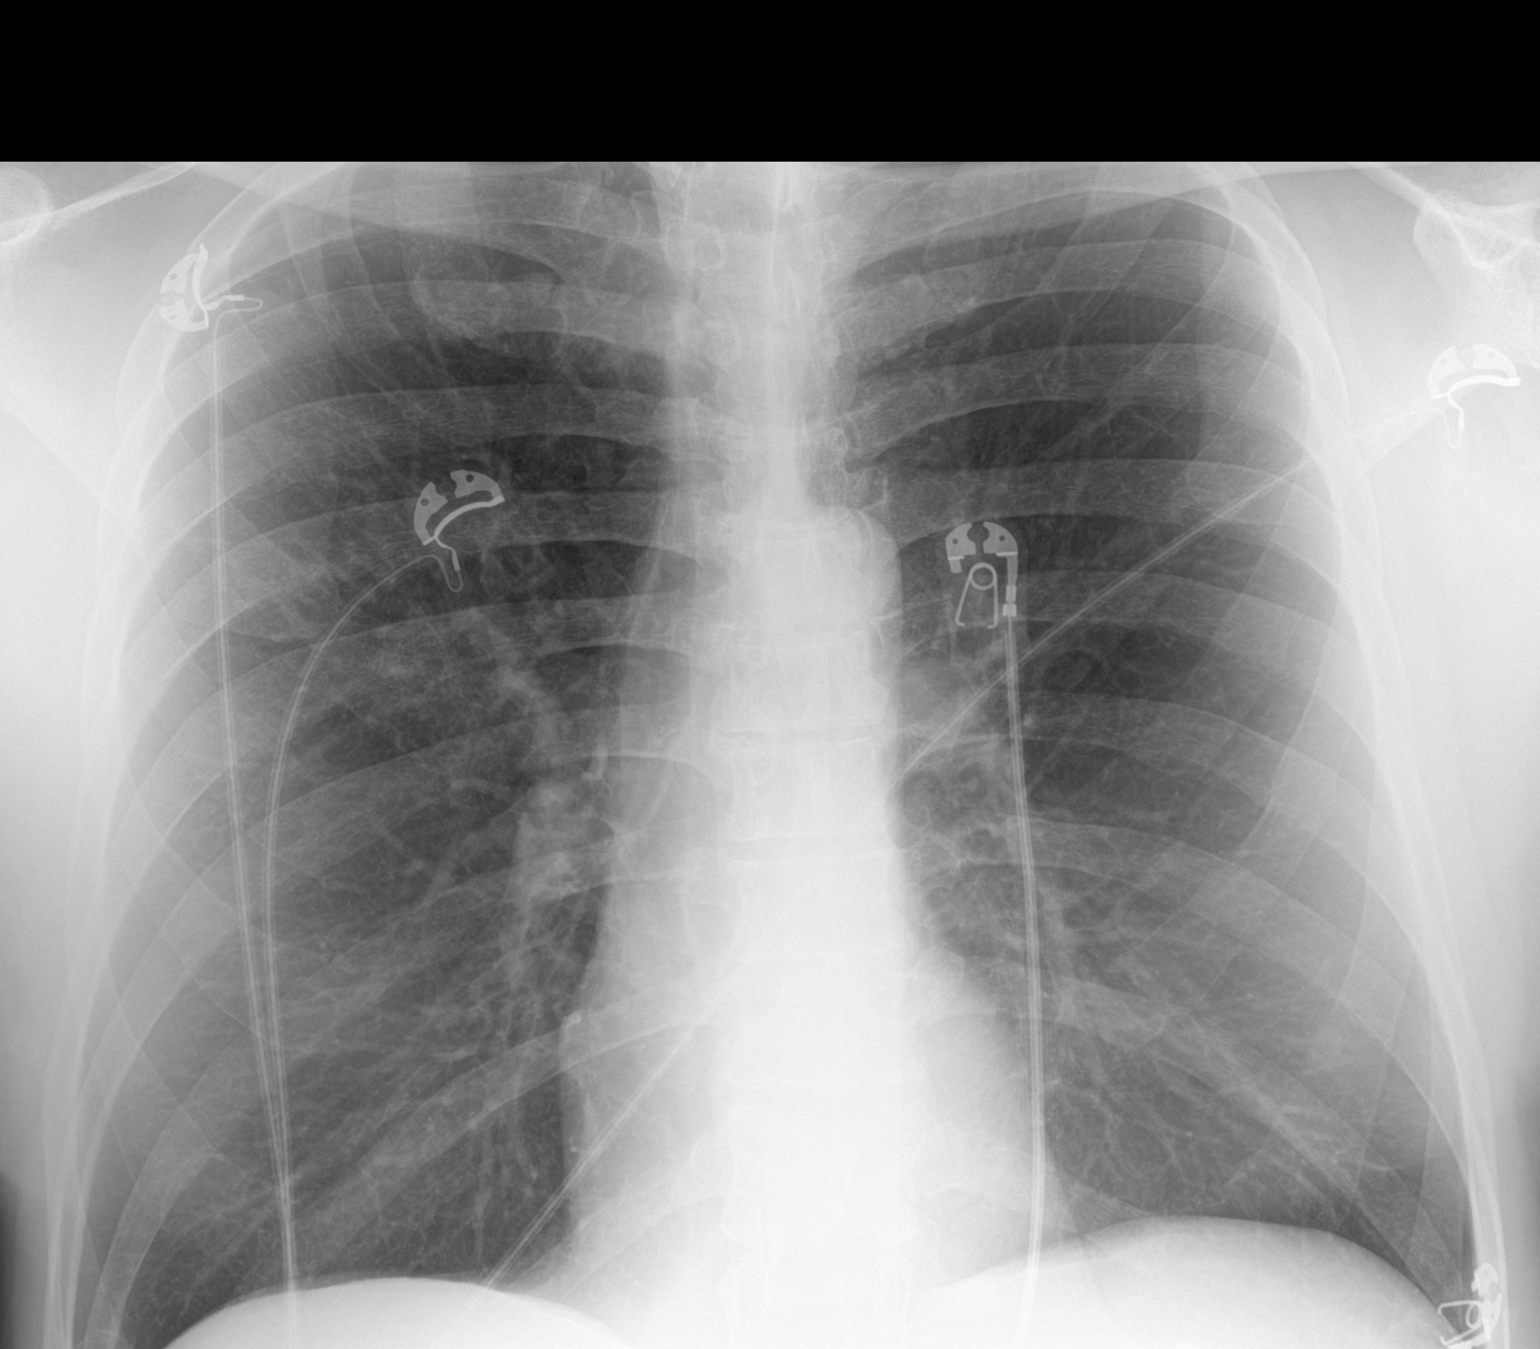
[im 2/2]
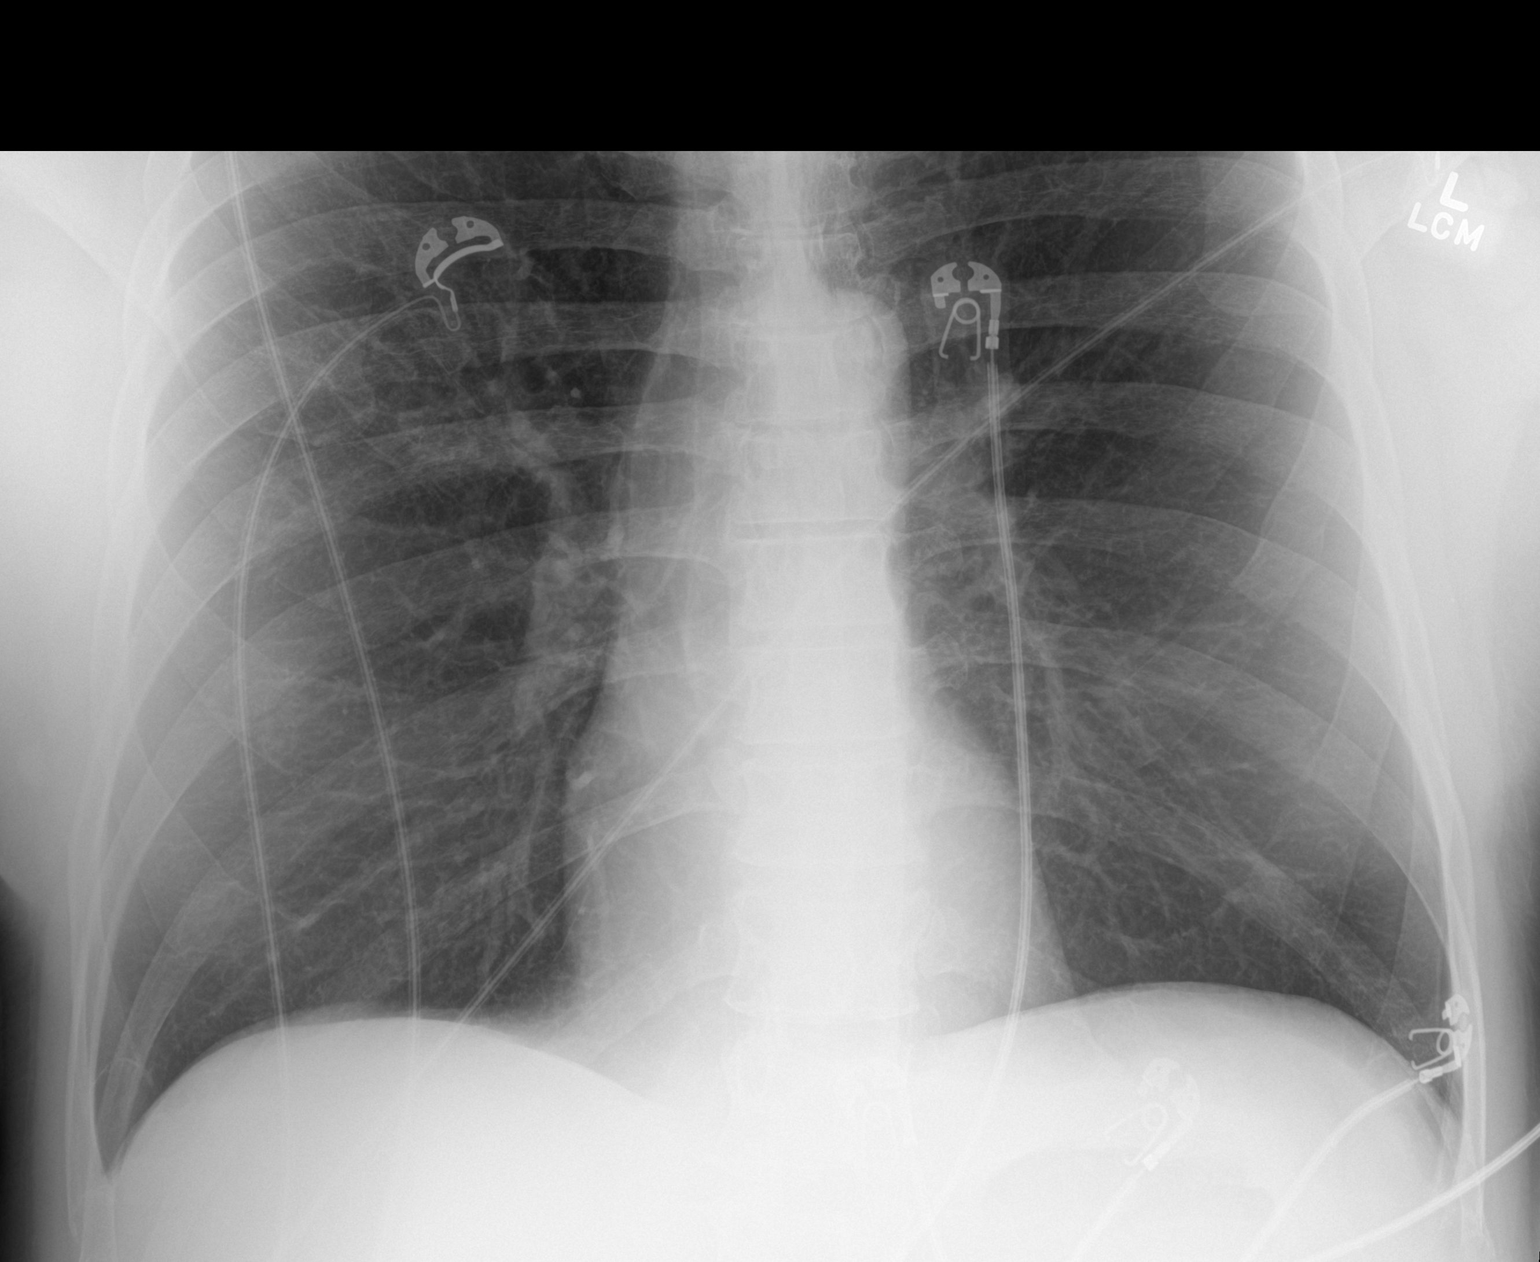

[2 of 2 positions shown; findings below may reference images not displayed]

FINDINGS: Cardiac size is within normal limits. Low position of diaphragms may
suggest air trapping or COPD. There are no signs of pulmonary edema
or focal pulmonary consolidation. There is no pleural effusion or
pneumothorax. There is minimal deformity in the anterolateral aspect
of left fifth and sixth ribs, possibly suggesting old healed
fractures.
IMPRESSION: Are no signs of pulmonary edema or focal pulmonary consolidation.

## 2023-10-25 ENCOUNTER — Encounter (HOSPITAL_COMMUNITY): Payer: Self-pay | Admitting: Emergency Medicine

## 2023-10-25 ENCOUNTER — Emergency Department (HOSPITAL_COMMUNITY)
Admission: EM | Admit: 2023-10-25 | Discharge: 2023-10-26 | Disposition: A | Discharge status: Home / Self Care | Payer: PRIVATE HEALTH INSURANCE | Attending: Emergency Medicine | Admitting: Emergency Medicine

## 2023-10-25 ENCOUNTER — Other Ambulatory Visit: Payer: Self-pay

## 2023-10-25 DIAGNOSIS — J45901 Unspecified asthma with (acute) exacerbation: Secondary | ICD-10-CM | POA: Insufficient documentation

## 2023-10-25 DIAGNOSIS — Z7951 Long term (current) use of inhaled steroids: Secondary | ICD-10-CM | POA: Insufficient documentation

## 2023-10-25 MED ORDER — MAGNESIUM SULFATE 2 GM/50ML IV SOLN
2.0000 g | Freq: Once | INTRAVENOUS | Status: AC
  Administered 2023-10-26: 2 g via INTRAVENOUS
  Filled 2023-10-25: qty 50

## 2023-10-25 NOTE — ED Provider Notes (Incomplete)
  [place] EMERGENCY DEPARTMENT AT [facility] Provider Note   CSN: [id number] Arrival date & time: 10/25/23  2353     Patient presents with: Asthma   Jason Cardenas is a 63 y.o. male.  {Add pertinent medical, surgical, social history, OB history to HPI:32947}  Asthma       Prior to Admission medications   Medication Sig Start Date End Date Taking? Authorizing Provider  albuterol  (PROVENTIL ) (2.5 MG/3ML) 0.083% nebulizer solution Inhale 3 mLs (2.5 mg total) into the lungs every 6 (six) hours as needed for wheezing or shortness of breath. 09/21/23   Raspet, Erin K, PA-C  albuterol  (VENTOLIN  HFA) 108 (90 Base) MCG/ACT inhaler Inhale 1-2 puffs into the lungs every 6 (six) hours as needed for wheezing or shortness of breath. 09/21/23   Raspet, Erin K, PA-C  Fluticasone -Salmeterol (ADVAIR) 250-50 MCG/DOSE AEPB Inhale 1 puff into the lungs daily. 09/21/23   Raspet, Erin K, PA-C  predniSONE  (STERAPRED UNI-PAK 21 TAB) 10 MG (21) TBPK tablet Take by mouth daily. Take 6 tabs by mouth daily  for 2 days, then 5 tabs for 2 days, then 4 tabs for 2 days, then 3 tabs for 2 days, 2 tabs for 2 days, then 1 tab by mouth daily for 2 days 09/21/23   Raspet, Erin K, PA-C    Allergies: Patient has no known allergies.    Review of Systems  Updated Vital Signs BP (!) 140/94 (BP Location: Right Arm)   Pulse 73   Temp 97.8 F (36.6 C) (Axillary)   Resp (!) 22   SpO2 100%   Physical Exam  (all labs ordered are listed, but only abnormal results are displayed) Labs Reviewed  BASIC METABOLIC PANEL WITH GFR  CBC WITH DIFFERENTIAL/PLATELET    EKG: None  Radiology: No results found.  {Document cardiac monitor, telemetry assessment procedure when appropriate:32947} Procedures   Medications Ordered in the ED  magnesium  sulfate IVPB 2 g 50 mL (has no administration in time range)      {Click here for ABCD2, HEART and other calculators REFRESH Note before signing:1}                               Medical Decision Making Amount and/or Complexity of Data Reviewed Labs: ordered. Radiology: ordered.  Risk Prescription drug management.   ***  {Document critical care time when appropriate  Document review of labs and clinical decision tools ie CHADS2VASC2, etc  Document your independent review of radiology images and any outside records  Document your discussion with family members, caretakers and with consultants  Document social determinants of health affecting pt's care  Document your decision making why or why not admission, treatments were needed:32947:::1}   Final diagnoses:  None    ED Discharge Orders     None

## 2023-10-25 NOTE — ED Provider Notes (Signed)
 [place] EMERGENCY DEPARTMENT AT [facility] Provider Note   CSN: [id number] Arrival date & time: 10/25/23  2353     Patient presents with: Asthma   Jason Cardenas is a 63 y.o. male.  Patient with past medical history significant for severe persistent asthma, asthma due to environmental allergies presents the emergency department via EMS complaining of an asthma exacerbation.  He states that for the past few days he has been using his inhaler more than usual and ran out yesterday.  He was at work this evening when he began to wheeze and called 911.  EMS reports initial presentation with 92% on room air, wheezes in bilateral upper fields, diminished lung sounds in bilateral lower fields.  They report administering 10 mg of albuterol , 1 mg of Atrovent , and 125 mg of Solu-Medrol .  They state that his work of breathing improved during transport and that prior to arrival of the reauscultated the lungs and they can now appreciate wheezing in upper and lower lung fields.  Upon arrival the patient is on a mask getting a breathing treatment.  He states he is starting to feel better.  He denies abdominal pain, nausea, vomiting, chest pain, other symptoms at this time.  He says that when the weather changes it sometimes triggers his asthma exacerbations.    Asthma       Prior to Admission medications   Medication Sig Start Date End Date Taking? Authorizing Provider  albuterol  (PROVENTIL ) (2.5 MG/3ML) 0.083% nebulizer solution Inhale 3 mLs (2.5 mg total) into the lungs every 6 (six) hours as needed for wheezing or shortness of breath. 09/21/23   Raspet, Erin K, PA-C  albuterol  (VENTOLIN  HFA) 108 (90 Base) MCG/ACT inhaler Inhale 1-2 puffs into the lungs every 6 (six) hours as needed for wheezing or shortness of breath. 10/26/23   Logan Ubaldo NOVAK, PA-C  Fluticasone -Salmeterol (ADVAIR) 250-50 MCG/DOSE AEPB Inhale 1 puff into the lungs daily. 09/21/23   Raspet, Erin K, PA-C  predniSONE   (STERAPRED UNI-PAK 21 TAB) 10 MG (21) TBPK tablet Take by mouth daily. Take 6 tabs by mouth daily  for 2 days, then 5 tabs for 2 days, then 4 tabs for 2 days, then 3 tabs for 2 days, 2 tabs for 2 days, then 1 tab by mouth daily for 2 days 09/21/23   Raspet, Erin K, PA-C    Allergies: Patient has no known allergies.    Review of Systems  Updated Vital Signs BP (!) 140/94 (BP Location: Right Arm)   Pulse 73   Temp 97.8 F (36.6 C) (Axillary)   Resp (!) 22   SpO2 100%   Physical Exam Vitals and nursing note reviewed.  Constitutional:      General: He is not in acute distress.    Appearance: He is well-developed.  HENT:     Head: Normocephalic and atraumatic.  Eyes:     Conjunctiva/sclera: Conjunctivae normal.  Cardiovascular:     Rate and Rhythm: Normal rate and regular rhythm.     Heart sounds: No murmur heard. Pulmonary:     Effort: Pulmonary effort is normal. No respiratory distress.     Breath sounds: Wheezing (Wheezes initially, completely resolved after treatment) present.  Abdominal:     Palpations: Abdomen is soft.     Tenderness: There is no abdominal tenderness.  Musculoskeletal:        General: No swelling.     Cervical back: Neck supple.  Skin:    General: Skin is warm  and dry.     Capillary Refill: Capillary refill takes less than 2 seconds.  Neurological:     Mental Status: He is alert.  Psychiatric:        Mood and Affect: Mood normal.     (all labs ordered are listed, but only abnormal results are displayed) Labs Reviewed  BASIC METABOLIC PANEL WITH GFR - Abnormal; Notable for the following components:      Result Value   Chloride 112 (*)    CO2 19 (*)    Glucose, Bld 102 (*)    Calcium 8.6 (*)    All other components within normal limits  CBC WITH DIFFERENTIAL/PLATELET - Abnormal; Notable for the following components:   Eosinophils Absolute 0.6 (*)    All other components within normal limits    EKG: None  Radiology: DG Chest 2 View Result  Date: 10/26/2023 CLINICAL DATA:  Dyspnea. EXAM: CHEST - 2 VIEW COMPARISON:  Sep 21, 2023 FINDINGS: The heart size and mediastinal contours are within normal limits. A trace amount of linear atelectasis is suspected within the lateral aspect of the right lung base. No acute infiltrate, pleural effusion or pneumothorax is identified. The visualized skeletal structures are unremarkable. IMPRESSION: No active cardiopulmonary disease. Electronically Signed   By: Suzen Dials M.D.   On: 10/26/2023 01:12     Procedures   Medications Ordered in the ED  magnesium  sulfate IVPB 2 g 50 mL (2 g Intravenous New Bag/Given 10/26/23 0001)                                    Medical Decision Making Amount and/or Complexity of Data Reviewed Labs: ordered. Radiology: ordered.  Risk Prescription drug management.   This patient presents to the ED for concern of shortness of breath, this involves an extensive number of treatment options, and is a complaint that carries with it a high risk of complications and morbidity.  The differential diagnosis includes asthma exacerbation, allergic reaction, ACS, pneumonia, others   Co morbidities / Chronic conditions that complicate the patient evaluation  Asthma   Additional history obtained:  Additional history obtained from EMR External records from outside source obtained and reviewed including urgent care notes   Lab Tests:  I Ordered, and personally interpreted labs.  The pertinent results include: Grossly unremarkable CBC, BMP   Imaging Studies ordered:  I ordered imaging studies including chest x-ray I independently visualized and interpreted imaging which showed no acute findings I agree with the radiologist interpretation   Cardiac Monitoring: / EKG:  The patient was maintained on a cardiac monitor.  I personally viewed and interpreted the cardiac monitored which showed an underlying rhythm of: Sinus rhythm   Problem List / ED Course /  Critical interventions / Medication management   I ordered medication including magnesium  sulfate Reevaluation of the patient after these medicines showed that the patient improved I have reviewed the patients home medicines and have made adjustments as needed   Social Determinants of Health:  Patient with no health insurance   Test / Admission - Considered:  Patient with presentation consistent with asthma exacerbation.  Workup was grossly unremarkable.  Patient initially had wheezes in both upper and lower lobes of bilateral lungs.  These have completely resolved with medication.  His oxygen saturations are 99% on room air.  At this time there is no indication for further emergent workup or admission.  Will  discharge home with refills of his rescue inhalers with recommendations for follow-up with primary care.  Return precautions provided.      Final diagnoses:  Severe asthma with exacerbation, unspecified whether persistent    ED Discharge Orders          Ordered    albuterol  (VENTOLIN  HFA) 108 (90 Base) MCG/ACT inhaler  Every 6 hours PRN        10/26/23 0234               Logan Ubaldo NOVAK, PA-C 10/26/23 0235    Theadore Ozell HERO, MD 10/26/23 [phone]

## 2023-10-25 NOTE — ED Triage Notes (Signed)
 Pt having worsening SOB over the past couple of days. Pt ran out of his inhaler as well.

## 2023-10-26 ENCOUNTER — Emergency Department (HOSPITAL_COMMUNITY): Payer: Self-pay

## 2023-10-26 LAB — CBC WITH DIFFERENTIAL/PLATELET
Abs Immature Granulocytes: 0.02 K/uL (ref 0.00–0.07)
Basophils Absolute: 0.1 K/uL (ref 0.0–0.1)
Basophils Relative: 1 %
Eosinophils Absolute: 0.6 K/uL — ABNORMAL HIGH (ref 0.0–0.5)
Eosinophils Relative: 7 %
HCT: 45.7 % (ref 39.0–52.0)
Hemoglobin: 14.9 g/dL (ref 13.0–17.0)
Immature Granulocytes: 0 %
Lymphocytes Relative: 31 %
Lymphs Abs: 2.6 K/uL (ref 0.7–4.0)
MCH: 28.4 pg (ref 26.0–34.0)
MCHC: 32.6 g/dL (ref 30.0–36.0)
MCV: 87 fL (ref 80.0–100.0)
Monocytes Absolute: 0.6 K/uL (ref 0.1–1.0)
Monocytes Relative: 7 %
Neutro Abs: 4.6 K/uL (ref 1.7–7.7)
Neutrophils Relative %: 54 %
Platelets: 292 K/uL (ref 150–400)
RBC: 5.25 MIL/uL (ref 4.22–5.81)
RDW: 13.6 % (ref 11.5–15.5)
WBC: 8.4 K/uL (ref 4.0–10.5)
nRBC: 0 % (ref 0.0–0.2)

## 2023-10-26 LAB — BASIC METABOLIC PANEL WITH GFR
Anion gap: 9 (ref 5–15)
BUN: 16 mg/dL (ref 6–20)
CO2: 19 mmol/L — ABNORMAL LOW (ref 22–32)
Calcium: 8.6 mg/dL — ABNORMAL LOW (ref 8.9–10.3)
Chloride: 112 mmol/L — ABNORMAL HIGH (ref 98–111)
Creatinine, Ser: 1.07 mg/dL (ref 0.61–1.24)
GFR, Estimated: >60 mL/min (ref >60)
Glucose, Bld: 102 mg/dL — ABNORMAL HIGH (ref 70–99)
Potassium: 3.9 mmol/L (ref 3.5–5.1)
Sodium: 140 mmol/L (ref 135–145)

## 2023-10-26 MED ORDER — ALBUTEROL SULFATE HFA 108 (90 BASE) MCG/ACT IN AERS: 1.0000 | INHALATION_SPRAY | Freq: Four times a day (QID) | RESPIRATORY_TRACT | 1 refills | Status: AC | PRN

## 2023-10-26 NOTE — Discharge Instructions (Signed)
 You were seen today for an asthma exacerbation.  This was treated with medications.  I have provided a refill of your albuterol .  Please follow with your primary care team for further evaluation as needed.  If you develop any life-threatening symptoms return to the emergency department.

## 2023-12-15 ENCOUNTER — Emergency Department (HOSPITAL_COMMUNITY)
Admission: EM | Admit: 2023-12-15 | Discharge: 2023-12-15 | Disposition: A | Discharge status: Home / Self Care | Payer: Self-pay | Attending: Emergency Medicine | Admitting: Emergency Medicine

## 2023-12-15 ENCOUNTER — Emergency Department (HOSPITAL_COMMUNITY): Payer: Self-pay

## 2023-12-15 ENCOUNTER — Other Ambulatory Visit: Payer: Self-pay

## 2023-12-15 ENCOUNTER — Encounter (HOSPITAL_COMMUNITY): Payer: Self-pay

## 2023-12-15 DIAGNOSIS — Z7951 Long term (current) use of inhaled steroids: Secondary | ICD-10-CM | POA: Insufficient documentation

## 2023-12-15 DIAGNOSIS — J4541 Moderate persistent asthma with (acute) exacerbation: Secondary | ICD-10-CM | POA: Insufficient documentation

## 2023-12-15 LAB — BASIC METABOLIC PANEL WITH GFR
Anion gap: 4 — ABNORMAL LOW (ref 5–15)
BUN: 9 mg/dL (ref 6–20)
CO2: 21 mmol/L — ABNORMAL LOW (ref 22–32)
Calcium: 8.7 mg/dL — ABNORMAL LOW (ref 8.9–10.3)
Chloride: 110 mmol/L (ref 98–111)
Creatinine, Ser: 1.03 mg/dL (ref 0.61–1.24)
GFR, Estimated: >60 mL/min (ref >60)
Glucose, Bld: 121 mg/dL — ABNORMAL HIGH (ref 70–99)
Potassium: 4.6 mmol/L (ref 3.5–5.1)
Sodium: 135 mmol/L (ref 135–145)

## 2023-12-15 LAB — RESP PANEL BY RT-PCR (RSV, FLU A&B, COVID)  RVPGX2
Influenza A by PCR: NEGATIVE
Influenza B by PCR: NEGATIVE
Resp Syncytial Virus by PCR: NEGATIVE
SARS Coronavirus 2 by RT PCR: NEGATIVE

## 2023-12-15 LAB — CBC WITH DIFFERENTIAL/PLATELET
Abs Immature Granulocytes: 0.02 K/uL (ref 0.00–0.07)
Basophils Absolute: 0 K/uL (ref 0.0–0.1)
Basophils Relative: 0 %
Eosinophils Absolute: 0.4 K/uL (ref 0.0–0.5)
Eosinophils Relative: 4 %
HCT: 50.4 % (ref 39.0–52.0)
Hemoglobin: 16.1 g/dL (ref 13.0–17.0)
Immature Granulocytes: 0 %
Lymphocytes Relative: 17 %
Lymphs Abs: 1.4 K/uL (ref 0.7–4.0)
MCH: 27.6 pg (ref 26.0–34.0)
MCHC: 31.9 g/dL (ref 30.0–36.0)
MCV: 86.4 fL (ref 80.0–100.0)
Monocytes Absolute: 0.2 K/uL (ref 0.1–1.0)
Monocytes Relative: 2 %
Neutro Abs: 6.2 K/uL (ref 1.7–7.7)
Neutrophils Relative %: 77 %
Platelets: 308 K/uL (ref 150–400)
RBC: 5.83 MIL/uL — ABNORMAL HIGH (ref 4.22–5.81)
RDW: 14.2 % (ref 11.5–15.5)
WBC: 8.2 K/uL (ref 4.0–10.5)
nRBC: 0 % (ref 0.0–0.2)

## 2023-12-15 MED ORDER — PREDNISONE 10 MG (21) PO TBPK
ORAL_TABLET | Freq: Every day | ORAL | 0 refills | Status: AC
Start: 2023-12-15 — End: ?

## 2023-12-15 MED ORDER — IPRATROPIUM-ALBUTEROL 0.5-2.5 (3) MG/3ML IN SOLN
RESPIRATORY_TRACT | Status: AC
  Filled 2023-12-15: qty 12

## 2023-12-15 MED ORDER — PREDNISONE 20 MG PO TABS
60.0000 mg | ORAL_TABLET | Freq: Once | ORAL | Status: AC
  Administered 2023-12-15: 60 mg via ORAL
  Filled 2023-12-15: qty 3

## 2023-12-15 MED ORDER — ALBUTEROL SULFATE HFA 108 (90 BASE) MCG/ACT IN AERS: 1.0000 | INHALATION_SPRAY | Freq: Four times a day (QID) | RESPIRATORY_TRACT | 0 refills | Status: AC | PRN

## 2023-12-15 MED ORDER — ALBUTEROL SULFATE (2.5 MG/3ML) 0.083% IN NEBU
10.0000 mg | INHALATION_SOLUTION | Freq: Once | RESPIRATORY_TRACT | Status: AC
  Administered 2023-12-15: 10 mg via RESPIRATORY_TRACT
  Filled 2023-12-15: qty 12

## 2023-12-15 MED ORDER — MAGNESIUM SULFATE 2 GM/50ML IV SOLN
2.0000 g | Freq: Once | INTRAVENOUS | Status: AC
  Administered 2023-12-15: 2 g via INTRAVENOUS
  Filled 2023-12-15: qty 50

## 2023-12-15 NOTE — ED Triage Notes (Signed)
 Pt BIB [facility] from home c/o SOB d/t asthma exacerbation. Pt started feeling SOB since yesterday (08/22), used his inhalers with no relief. Started to feel worse late last night. Duoneb 5 mg albuterol  and 0.5mg  Atrovent  x2 given en route with 125 of solumedrol.

## 2023-12-15 NOTE — ED Notes (Signed)
 Patient ambulated with SpO2, did not drop below 95%. Patient in NAD.

## 2023-12-15 NOTE — ED Provider Notes (Signed)
 [place] EMERGENCY DEPARTMENT AT [facility] Provider Note   CSN: [id number] Arrival date & time: 12/15/23  9575     Patient presents with: Shortness of Breath   Jason Cardenas is a 63 y.o. male.  Past medical history significant for asthma presents today for shortness of breath that began yesterday.  Patient has been taking his at home inhalers without relief.  Patient received DuoNeb and 125 of Solu-Medrol  and route.  Patient denies fever, chills, nausea, vomiting, cough, congestion, chest pain, any other complaints at this time.    Shortness of Breath Associated symptoms: wheezing        Prior to Admission medications   Medication Sig Start Date End Date Taking? Authorizing Provider  albuterol  (VENTOLIN  HFA) 108 (90 Base) MCG/ACT inhaler Inhale 1-2 puffs into the lungs every 6 (six) hours as needed for wheezing or shortness of breath. 12/15/23  Yes Francis Ileana SAILOR, PA-C  predniSONE  (STERAPRED UNI-PAK 21 TAB) 10 MG (21) TBPK tablet Take by mouth daily. Take 6 tabs by mouth daily  for 2 days, then 5 tabs for 2 days, then 4 tabs for 2 days, then 3 tabs for 2 days, 2 tabs for 2 days, then 1 tab by mouth daily for 2 days 12/15/23  Yes Francis Ileana SAILOR, PA-C  albuterol  (PROVENTIL ) (2.5 MG/3ML) 0.083% nebulizer solution Inhale 3 mLs (2.5 mg total) into the lungs every 6 (six) hours as needed for wheezing or shortness of breath. 09/21/23   Raspet, Erin K, PA-C  albuterol  (VENTOLIN  HFA) 108 (90 Base) MCG/ACT inhaler Inhale 1-2 puffs into the lungs every 6 (six) hours as needed for wheezing or shortness of breath. 10/26/23   Logan Ubaldo NOVAK, PA-C  Fluticasone -Salmeterol (ADVAIR) 250-50 MCG/DOSE AEPB Inhale 1 puff into the lungs daily. 09/21/23   Raspet, Erin K, PA-C  predniSONE  (STERAPRED UNI-PAK 21 TAB) 10 MG (21) TBPK tablet Take by mouth daily. Take 6 tabs by mouth daily  for 2 days, then 5 tabs for 2 days, then 4 tabs for 2 days, then 3 tabs for 2 days, 2 tabs for 2 days, then 1  tab by mouth daily for 2 days 09/21/23   Raspet, Erin K, PA-C    Allergies: Patient has no known allergies.    Review of Systems  Respiratory:  Positive for shortness of breath and wheezing.     Updated Vital Signs BP (!) 155/103 (BP Location: Right Arm)   Pulse 77   Temp 98.6 F (37 C) (Oral)   Ht 5' 10 (1.778 m)   Wt 81.6 kg   SpO2 100%   BMI 25.83 kg/m   Physical Exam Vitals and nursing note reviewed.  Constitutional:      General: He is not in acute distress.    Appearance: He is well-developed. He is not ill-appearing.  HENT:     Head: Normocephalic and atraumatic.  Eyes:     Extraocular Movements: Extraocular movements intact.     Conjunctiva/sclera: Conjunctivae normal.  Neck:     Vascular: No JVD.  Cardiovascular:     Rate and Rhythm: Normal rate and regular rhythm.     Pulses: Normal pulses.     Heart sounds: Normal heart sounds. No murmur heard. Pulmonary:     Effort: Pulmonary effort is normal. Tachypnea present. No respiratory distress.     Breath sounds: Decreased breath sounds and wheezing present.  Abdominal:     Palpations: Abdomen is soft.     Tenderness: There is no  abdominal tenderness.  Musculoskeletal:        General: No swelling.     Cervical back: Neck supple.     Right lower leg: No edema.     Left lower leg: No edema.  Skin:    General: Skin is warm and dry.     Capillary Refill: Capillary refill takes less than 2 seconds.  Neurological:     General: No focal deficit present.     Mental Status: He is alert and oriented to person, place, and time.  Psychiatric:        Mood and Affect: Mood normal.     (all labs ordered are listed, but only abnormal results are displayed) Labs Reviewed  BASIC METABOLIC PANEL WITH GFR - Abnormal; Notable for the following components:      Result Value   CO2 21 (*)    Glucose, Bld 121 (*)    Calcium 8.7 (*)    Anion gap 4 (*)    All other components within normal limits  CBC WITH  DIFFERENTIAL/PLATELET - Abnormal; Notable for the following components:   RBC 5.83 (*)    All other components within normal limits  RESP PANEL BY RT-PCR (RSV, FLU A&B, COVID)  [id number]    EKG: None  Radiology: DG Chest 2 View Result Date: 12/15/2023 CLINICAL DATA:  Shortness of breath EXAM: CHEST - 2 VIEW COMPARISON:  10/26/2023 FINDINGS: Lungs are hyperexpanded. The lungs are clear without focal pneumonia, edema, pneumothorax or pleural effusion. The cardiopericardial silhouette is within normal limits for size. No acute bony abnormality. Telemetry leads overlie the chest. IMPRESSION: Hyperexpansion without acute cardiopulmonary findings. Electronically Signed   By: Camellia Candle M.D.   On: 12/15/2023 05:39     Procedures   Medications Ordered in the ED  magnesium  sulfate IVPB 2 g 50 mL (2 g Intravenous New Bag/Given 12/15/23 0513)  albuterol  (PROVENTIL ) (2.5 MG/3ML) 0.083% nebulizer solution 10 mg (10 mg Nebulization Given 12/15/23 9490)                                    Medical Decision Making Amount and/or Complexity of Data Reviewed Labs: ordered. Radiology: ordered.  Risk Prescription drug management.   This patient presents to the ED for concern of shortness of breath differential diagnosis includes COVID, flu, RSV, URI, pneumonia, asthma exacerbation    Additional history obtained   Additional history obtained from Electronic Medical Record External records from outside source obtained and reviewed including previous admission documents   Lab Tests:  I Ordered, and personally interpreted labs.  The pertinent results include: CBC unremarkable, mildly decreased CO2 at 21, mildly reduced anion gap at 4, respiratory panel negative   Imaging Studies ordered:  I ordered imaging studies including chest x-ray I independently visualized and interpreted imaging which showed hyperexpansion without acute cardiopulmonary findings I agree with the radiologist  interpretation EKG which showed sinus rhythm   Medicines ordered and prescription drug management:  I ordered medication including continuous albuterol  and magnesium     I have reviewed the patients home medicines and have made adjustments as needed   Problem List / ED Course:  Upon reassessment patient states that he is feeling better after the continuous nebulizer.  Patient's upper lobes are now clear to auscultation bilaterally, however he is still not moving air well in his bilateral lower lobes  6:38 AM Care of Dallen Mccaskey transferred to PA [facility]  [place] at the end of my shift as the patient will require reassessment once labs/imaging have resulted. Patient presentation, ED course, and plan of care discussed with review of all pertinent labs and imaging. Please see his/her note for further details regarding further ED course and disposition. Plan at time of handoff is ambulatory pulse ox and likely discharge. This may be altered or completely changed at the discretion of the oncoming team pending results of further workup.        Final diagnoses:  Moderate persistent asthma with exacerbation    ED Discharge Orders          Ordered    albuterol  (VENTOLIN  HFA) 108 (90 Base) MCG/ACT inhaler  Every 6 hours PRN        12/15/23 0638    predniSONE  (STERAPRED UNI-PAK 21 TAB) 10 MG (21) TBPK tablet  Daily        12/15/23 0638               Francis Ileana SAILOR, PA-C 12/15/23 9360    Palumbo, April, MD 12/15/23 9284

## 2023-12-15 NOTE — Discharge Instructions (Addendum)
 Today you were seen for an asthma exacerbation.  Please pick up your medications and take as prescribed.  Thank you for letting us  treat you today. After reviewing your labs and imaging, I feel you are safe to go home. Please follow up with your PCP in the next several days and provide them with your records from this visit. Return to the Emergency Room if pain becomes severe or symptoms worsen.

## 2023-12-15 NOTE — ED Provider Notes (Signed)
 Care assumed from [facility], PA-C at shift change pending ambulation with pulse ox.  See her note for full HPI.  In short, patient is a 63-year-old male with history of asthma who presents to the ED due to shortness of breath.  Patient received DuoNeb treatment and Solu-Medrol  prior to arrival. No associated chest pain.   Plan from previous provider, if patient is able to ambulate with O2 above 95% patient stable for discharge. Physical Exam  BP (!) 149/96   Pulse 95   Temp 98.6 F (37 C) (Oral)   Resp 16   Ht 5' 10 (1.778 m)   Wt 81.6 kg   SpO2 95%   BMI 25.83 kg/m   Physical Exam Vitals and nursing note reviewed.  Constitutional:      General: He is not in acute distress.    Appearance: He is not ill-appearing.  HENT:     Head: Normocephalic.  Eyes:     Pupils: Pupils are equal, round, and reactive to light.  Cardiovascular:     Rate and Rhythm: Normal rate and regular rhythm.     Pulses: Normal pulses.     Heart sounds: Normal heart sounds. No murmur heard.    No friction rub. No gallop.  Pulmonary:     Effort: Pulmonary effort is normal.     Breath sounds: Normal breath sounds.  Abdominal:     General: Abdomen is flat. There is no distension.     Palpations: Abdomen is soft.     Tenderness: There is no abdominal tenderness. There is no guarding or rebound.  Musculoskeletal:        General: Normal range of motion.     Cervical back: Neck supple.  Skin:    General: Skin is warm and dry.  Neurological:     General: No focal deficit present.     Mental Status: He is alert.  Psychiatric:        Mood and Affect: Mood normal.        Behavior: Behavior normal.     Procedures  Procedures  ED Course / MDM    Medical Decision Making Amount and/or Complexity of Data Reviewed Labs: ordered. Radiology: ordered.  Risk Prescription drug management.   7:19 AM reassessed patient at bedside.  Patient able to ambulate with O2 saturation above 95%.  Patient still  has slight late expiratory wheeze.  Patient notes he feels much improved and is ready to be discharged.  Patient discharged with albuterol  and prednisone  per previous provider.  Low suspicion for PE.  Shortness of breath likely due to asthma exacerbation per previous provider.  Patient stable for discharge. Strict ED precautions discussed with patient. Patient states understanding and agrees to plan. Patient discharged home in no acute distress and stable vitals         Lorelle Aleck JAYSON DEVONNA 12/15/23 0720    Palumbo, April, MD 12/15/23 2335
